# Patient Record
Sex: Female | Born: 1975 | Race: Black or African American | Hispanic: No | Marital: Married | Smoking: Former smoker
Health system: Southern US, Community
[De-identification: ages and names within clinical notes are randomized; demographics above are authoritative.]

## PROBLEM LIST (undated history)

## (undated) ENCOUNTER — Inpatient Hospital Stay (HOSPITAL_COMMUNITY): Payer: Self-pay

## (undated) DIAGNOSIS — R12 Heartburn: Secondary | ICD-10-CM

## (undated) DIAGNOSIS — R87619 Unspecified abnormal cytological findings in specimens from cervix uteri: Secondary | ICD-10-CM

## (undated) DIAGNOSIS — E739 Lactose intolerance, unspecified: Secondary | ICD-10-CM

## (undated) DIAGNOSIS — D219 Benign neoplasm of connective and other soft tissue, unspecified: Secondary | ICD-10-CM

## (undated) DIAGNOSIS — R51 Headache: Secondary | ICD-10-CM

## (undated) DIAGNOSIS — M1990 Unspecified osteoarthritis, unspecified site: Secondary | ICD-10-CM

## (undated) DIAGNOSIS — D649 Anemia, unspecified: Secondary | ICD-10-CM

## (undated) DIAGNOSIS — IMO0002 Reserved for concepts with insufficient information to code with codable children: Secondary | ICD-10-CM

## (undated) DIAGNOSIS — O26899 Other specified pregnancy related conditions, unspecified trimester: Secondary | ICD-10-CM

## (undated) DIAGNOSIS — M109 Gout, unspecified: Secondary | ICD-10-CM

## (undated) DIAGNOSIS — Z8719 Personal history of other diseases of the digestive system: Secondary | ICD-10-CM

## (undated) DIAGNOSIS — N809 Endometriosis, unspecified: Secondary | ICD-10-CM

## (undated) DIAGNOSIS — R519 Headache, unspecified: Secondary | ICD-10-CM

## (undated) DIAGNOSIS — E039 Hypothyroidism, unspecified: Secondary | ICD-10-CM

## (undated) HISTORY — PX: CRYOTHERAPY: SHX1416

## (undated) HISTORY — PX: UPPER GI ENDOSCOPY: SHX6162

## (undated) HISTORY — PX: COLONOSCOPY: SHX174

## (undated) HISTORY — PX: TOTAL HIP ARTHROPLASTY: SHX124

## (undated) HISTORY — PX: OTHER SURGICAL HISTORY: SHX169

## (undated) HISTORY — PX: DIAGNOSTIC LAPAROSCOPY: SUR761

## (undated) HISTORY — PX: WISDOM TOOTH EXTRACTION: SHX21

## (undated) HISTORY — PX: KNEE SURGERY: SHX244

## (undated) HISTORY — DX: Unspecified osteoarthritis, unspecified site: M19.90

---

## 2004-11-30 ENCOUNTER — Emergency Department (HOSPITAL_COMMUNITY): Admission: EM | Admit: 2004-11-30 | Discharge: 2004-11-30 | Payer: Self-pay | Admitting: Emergency Medicine

## 2004-12-11 ENCOUNTER — Emergency Department (HOSPITAL_COMMUNITY): Admission: EM | Admit: 2004-12-11 | Discharge: 2004-12-11 | Payer: Self-pay | Admitting: Emergency Medicine

## 2007-11-09 ENCOUNTER — Emergency Department (HOSPITAL_COMMUNITY): Admission: EM | Admit: 2007-11-09 | Discharge: 2007-11-09 | Payer: Self-pay | Admitting: Emergency Medicine

## 2008-03-26 ENCOUNTER — Emergency Department (HOSPITAL_COMMUNITY): Admission: EM | Admit: 2008-03-26 | Discharge: 2008-03-26 | Payer: Self-pay | Admitting: Emergency Medicine

## 2008-06-01 ENCOUNTER — Emergency Department (HOSPITAL_COMMUNITY): Admission: EM | Admit: 2008-06-01 | Discharge: 2008-06-01 | Payer: Self-pay | Admitting: Emergency Medicine

## 2008-06-11 ENCOUNTER — Emergency Department (HOSPITAL_COMMUNITY): Admission: EM | Admit: 2008-06-11 | Discharge: 2008-06-11 | Payer: Self-pay | Admitting: Emergency Medicine

## 2008-09-17 ENCOUNTER — Emergency Department (HOSPITAL_COMMUNITY): Admission: EM | Admit: 2008-09-17 | Discharge: 2008-09-17 | Payer: Self-pay | Admitting: Emergency Medicine

## 2009-01-17 ENCOUNTER — Emergency Department (HOSPITAL_COMMUNITY): Admission: EM | Admit: 2009-01-17 | Discharge: 2009-01-17 | Payer: Self-pay | Admitting: Emergency Medicine

## 2009-02-01 ENCOUNTER — Emergency Department (HOSPITAL_COMMUNITY): Admission: EM | Admit: 2009-02-01 | Discharge: 2009-02-01 | Payer: Self-pay | Admitting: Family Medicine

## 2009-05-19 ENCOUNTER — Emergency Department (HOSPITAL_BASED_OUTPATIENT_CLINIC_OR_DEPARTMENT_OTHER): Admission: EM | Admit: 2009-05-19 | Discharge: 2009-05-20 | Payer: Self-pay | Admitting: Emergency Medicine

## 2009-05-22 ENCOUNTER — Emergency Department (HOSPITAL_COMMUNITY): Admission: EM | Admit: 2009-05-22 | Discharge: 2009-05-22 | Payer: Self-pay | Admitting: Emergency Medicine

## 2009-09-16 ENCOUNTER — Emergency Department (HOSPITAL_COMMUNITY): Admission: EM | Admit: 2009-09-16 | Discharge: 2009-09-16 | Payer: Self-pay | Admitting: Emergency Medicine

## 2009-09-27 ENCOUNTER — Emergency Department (HOSPITAL_COMMUNITY): Admission: EM | Admit: 2009-09-27 | Discharge: 2009-09-28 | Payer: Self-pay | Admitting: Emergency Medicine

## 2010-01-10 ENCOUNTER — Emergency Department (HOSPITAL_COMMUNITY)
Admission: EM | Admit: 2010-01-10 | Discharge: 2010-01-10 | Payer: Self-pay | Source: Home / Self Care | Admitting: Emergency Medicine

## 2010-02-12 ENCOUNTER — Emergency Department (HOSPITAL_COMMUNITY)
Admission: EM | Admit: 2010-02-12 | Discharge: 2010-02-12 | Payer: Self-pay | Source: Home / Self Care | Admitting: Emergency Medicine

## 2010-04-19 LAB — POCT I-STAT, CHEM 8
Calcium, Ion: 1.05 mmol/L — ABNORMAL LOW (ref 1.12–1.32)
Creatinine, Ser: 1 mg/dL (ref 0.4–1.2)
Glucose, Bld: 97 mg/dL (ref 70–99)
HCT: 47 % — ABNORMAL HIGH (ref 36.0–46.0)
Potassium: 3.4 mEq/L — ABNORMAL LOW (ref 3.5–5.1)
TCO2: 22 mmol/L (ref 0–100)

## 2010-04-23 LAB — URINALYSIS, ROUTINE W REFLEX MICROSCOPIC: Specific Gravity, Urine: 1.031 — ABNORMAL HIGH (ref 1.005–1.030)

## 2010-04-23 LAB — URINE MICROSCOPIC-ADD ON

## 2010-04-24 ENCOUNTER — Emergency Department (HOSPITAL_COMMUNITY)
Admission: EM | Admit: 2010-04-24 | Discharge: 2010-04-24 | Disposition: A | Discharge status: Home / Self Care | Payer: Self-pay | Attending: Emergency Medicine | Admitting: Emergency Medicine

## 2010-04-24 DIAGNOSIS — H9210 Otorrhea, unspecified ear: Secondary | ICD-10-CM | POA: Insufficient documentation

## 2010-04-24 DIAGNOSIS — H60399 Other infective otitis externa, unspecified ear: Secondary | ICD-10-CM | POA: Insufficient documentation

## 2010-04-25 ENCOUNTER — Emergency Department (HOSPITAL_COMMUNITY)
Admission: EM | Admit: 2010-04-25 | Discharge: 2010-04-26 | Disposition: A | Discharge status: Other Acute Inpatient Hospital | Payer: Self-pay | Attending: Emergency Medicine | Admitting: Emergency Medicine

## 2010-04-25 ENCOUNTER — Emergency Department (HOSPITAL_COMMUNITY): Payer: Self-pay

## 2010-04-25 DIAGNOSIS — F329 Major depressive disorder, single episode, unspecified: Secondary | ICD-10-CM | POA: Insufficient documentation

## 2010-04-25 DIAGNOSIS — I498 Other specified cardiac arrhythmias: Secondary | ICD-10-CM | POA: Insufficient documentation

## 2010-04-25 DIAGNOSIS — F3289 Other specified depressive episodes: Secondary | ICD-10-CM | POA: Insufficient documentation

## 2010-04-25 DIAGNOSIS — IMO0002 Reserved for concepts with insufficient information to code with codable children: Secondary | ICD-10-CM | POA: Insufficient documentation

## 2010-04-25 DIAGNOSIS — F1010 Alcohol abuse, uncomplicated: Secondary | ICD-10-CM | POA: Insufficient documentation

## 2010-04-25 DIAGNOSIS — H60399 Other infective otitis externa, unspecified ear: Secondary | ICD-10-CM | POA: Insufficient documentation

## 2010-04-25 LAB — COMPREHENSIVE METABOLIC PANEL
ALT: 13 U/L (ref 0–35)
BUN: 8 mg/dL (ref 6–23)
CO2: 19 mEq/L (ref 19–32)
Calcium: 9.4 mg/dL (ref 8.4–10.5)
Chloride: 109 mEq/L (ref 96–112)
Creatinine, Ser: 0.75 mg/dL (ref 0.4–1.2)
GFR calc non Af Amer: >60 mL/min (ref >60)
Potassium: 4.2 mEq/L (ref 3.5–5.1)
Sodium: 143 mEq/L (ref 135–145)
Total Bilirubin: 0.5 mg/dL (ref 0.3–1.2)

## 2010-04-25 LAB — URINALYSIS, ROUTINE W REFLEX MICROSCOPIC
Glucose, UA: NEGATIVE mg/dL
Ketones, ur: NEGATIVE mg/dL
Nitrite: NEGATIVE
Protein, ur: NEGATIVE mg/dL
Specific Gravity, Urine: 1.006 (ref 1.005–1.030)
Urobilinogen, UA: 0.2 mg/dL (ref 0.0–1.0)

## 2010-04-25 LAB — CBC
HCT: 44.3 % (ref 36.0–46.0)
Hemoglobin: 14.8 g/dL (ref 12.0–15.0)
MCV: 94.9 fL (ref 78.0–100.0)
RBC: 4.67 MIL/uL (ref 3.87–5.11)
RDW: 13 % (ref 11.5–15.5)

## 2010-04-25 LAB — DIFFERENTIAL
Basophils Absolute: 0.1 10*3/uL (ref 0.0–0.1)
Basophils Relative: 1 % (ref 0–1)
Eosinophils Relative: 0 % (ref 0–5)
Lymphs Abs: 2.3 10*3/uL (ref 0.7–4.0)
Neutro Abs: 7.7 10*3/uL (ref 1.7–7.7)
Neutrophils Relative %: 71 % (ref 43–77)

## 2010-04-25 LAB — PREGNANCY, URINE: Preg Test, Ur: NEGATIVE

## 2010-04-25 LAB — POCT CARDIAC MARKERS: Myoglobin, poc: 108 ng/mL (ref 12–200)

## 2010-04-25 LAB — RAPID URINE DRUG SCREEN, HOSP PERFORMED
Benzodiazepines: NOT DETECTED
Cocaine: NOT DETECTED

## 2010-04-25 LAB — ACETAMINOPHEN LEVEL: Acetaminophen (Tylenol), Serum: <10 ug/mL — ABNORMAL LOW (ref 10–30)

## 2010-04-25 LAB — ETHANOL: Alcohol, Ethyl (B): 113 mg/dL — ABNORMAL HIGH (ref 0–10)

## 2010-04-26 ENCOUNTER — Inpatient Hospital Stay (HOSPITAL_COMMUNITY)
Admission: AD | Admit: 2010-04-26 | Discharge: 2010-04-27 | DRG: 882 | Disposition: A | Discharge status: Home / Self Care | Payer: PRIVATE HEALTH INSURANCE | Source: Ambulatory Visit | Attending: Psychiatry | Admitting: Psychiatry

## 2010-04-26 DIAGNOSIS — F4325 Adjustment disorder with mixed disturbance of emotions and conduct: Principal | ICD-10-CM

## 2010-04-26 DIAGNOSIS — M109 Gout, unspecified: Secondary | ICD-10-CM

## 2010-04-26 DIAGNOSIS — F3289 Other specified depressive episodes: Secondary | ICD-10-CM

## 2010-04-26 DIAGNOSIS — T481X4A Poisoning by skeletal muscle relaxants [neuromuscular blocking agents], undetermined, initial encounter: Secondary | ICD-10-CM

## 2010-04-26 DIAGNOSIS — F329 Major depressive disorder, single episode, unspecified: Secondary | ICD-10-CM

## 2010-04-26 DIAGNOSIS — H60399 Other infective otitis externa, unspecified ear: Secondary | ICD-10-CM

## 2010-04-26 DIAGNOSIS — F1020 Alcohol dependence, uncomplicated: Secondary | ICD-10-CM

## 2010-04-26 DIAGNOSIS — T50992A Poisoning by other drugs, medicaments and biological substances, intentional self-harm, initial encounter: Secondary | ICD-10-CM

## 2010-04-26 DIAGNOSIS — F332 Major depressive disorder, recurrent severe without psychotic features: Secondary | ICD-10-CM

## 2010-04-27 DIAGNOSIS — F4320 Adjustment disorder, unspecified: Secondary | ICD-10-CM

## 2010-05-07 LAB — DIFFERENTIAL
Basophils Absolute: 0 10*3/uL (ref 0.0–0.1)
Basophils Relative: 0 % (ref 0–1)
Lymphs Abs: 1.3 10*3/uL (ref 0.7–4.0)
Neutro Abs: 7.4 10*3/uL (ref 1.7–7.7)
Neutrophils Relative %: 76 % (ref 43–77)

## 2010-05-07 LAB — WET PREP, GENITAL
Clue Cells Wet Prep HPF POC: NONE SEEN
WBC, Wet Prep HPF POC: NONE SEEN

## 2010-05-07 LAB — CBC
HCT: 41.2 % (ref 36.0–46.0)
Hemoglobin: 14.2 g/dL (ref 12.0–15.0)
MCHC: 34.4 g/dL (ref 30.0–36.0)
MCV: 96.3 fL (ref 78.0–100.0)
RDW: 13 % (ref 11.5–15.5)

## 2010-05-07 LAB — URINALYSIS, ROUTINE W REFLEX MICROSCOPIC
Bilirubin Urine: NEGATIVE
Nitrite: NEGATIVE
Protein, ur: NEGATIVE mg/dL
Specific Gravity, Urine: 1.01 (ref 1.005–1.030)
Urobilinogen, UA: 0.2 mg/dL (ref 0.0–1.0)

## 2010-05-07 LAB — URINE MICROSCOPIC-ADD ON

## 2010-05-07 LAB — COMPREHENSIVE METABOLIC PANEL
AST: 17 U/L (ref 0–37)
Albumin: 3.5 g/dL (ref 3.5–5.2)
Alkaline Phosphatase: 57 U/L (ref 39–117)
BUN: 5 mg/dL — ABNORMAL LOW (ref 6–23)
CO2: 27 mEq/L (ref 19–32)
Chloride: 103 mEq/L (ref 96–112)
Glucose, Bld: 97 mg/dL (ref 70–99)
Total Bilirubin: 0.5 mg/dL (ref 0.3–1.2)

## 2010-05-15 LAB — URINALYSIS, ROUTINE W REFLEX MICROSCOPIC
Ketones, ur: 15 mg/dL — AB
Nitrite: NEGATIVE
Nitrite: NEGATIVE
Protein, ur: 30 mg/dL — AB
Specific Gravity, Urine: 1.025 (ref 1.005–1.030)
Urobilinogen, UA: 1 mg/dL (ref 0.0–1.0)
Urobilinogen, UA: 1 mg/dL (ref 0.0–1.0)

## 2010-05-15 LAB — WET PREP, GENITAL
Clue Cells Wet Prep HPF POC: NONE SEEN
Trich, Wet Prep: NONE SEEN
WBC, Wet Prep HPF POC: NONE SEEN

## 2010-05-15 LAB — POCT PREGNANCY, URINE
Preg Test, Ur: NEGATIVE
Preg Test, Ur: NEGATIVE

## 2010-05-15 LAB — URINE MICROSCOPIC-ADD ON

## 2010-05-21 LAB — SYNOVIAL CELL COUNT + DIFF, W/ CRYSTALS
Crystals, Fluid: NONE SEEN
WBC, Synovial: 14330 /mm3 — ABNORMAL HIGH (ref 0–200)

## 2010-05-21 LAB — GRAM STAIN

## 2010-05-21 LAB — BODY FLUID CULTURE: Culture: NO GROWTH

## 2010-05-21 LAB — GLUCOSE, SEROUS FLUID: Glucose, Fluid: 95 mg/dL

## 2010-06-10 NOTE — H&P (Signed)
NAME:  Paula Cervantes, Paula Cervantes NO.:  [id number]  MEDICAL RECORD NO.:  [id number]           PATIENT TYPE:  I  LOCATION:  0304                          FACILITY:  [facility]  PHYSICIAN:  Anselm Jungling, MD  DATE OF BIRTH:  December 30, 1975  DATE OF ADMISSION:  04/26/2010 DATE OF DISCHARGE:                      PSYCHIATRIC ADMISSION ASSESSMENT   This is a voluntary admission to the services of Dr. Electa Sniff.  This is a 35 year old divorced African American female.  She was brought to the ED by EMS.  She had overdosed.  She was noted to be unresponsive when EMS got there. 9-1-1 had received a call reporting that the patient had overdosed.  She was unable to answer questions.  She did smell of alcohol at that time they found her.  There was an open bottle of Flexeril 10 mg near her. Her alcohol level apparently was 118 and today she reports that she actually overdosed on 2 Flexeril.  She reported that she had found sexually explicit photos of her fiance on her phone; this prompted her to drink, she has not been drinking in over a year. She stated that she could not remember exactly how she got to the ED but apparently she called her fiancee's mother who called EMS.  She did report that she has attempted suicide in the past, and she denied suicidal, homicidal or auditory or visual hallucinations while in the emergency room.  PAST PSYCHIATRIC HISTORY:  At age 53 or 41 she overdosed on nicotine pills; this was due to being unhappy with her group home foster family situation.  She denies any other recent hospitalizations.  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  She has been married and divorced once.  She has no children.  She recently lost her employment.  She was in a manufacturing position.  FAMILY HISTORY:  She denies alcohol and drug history.  She states that on March 08, 2009 she woke up in her own bed after an alcoholic blackouts and she  has not drank any alcohol until she found the sexually explicit pictures of her boyfriend on a cell phone.  PRIMARY CARE PROVIDER:  She does not have one.  OUTPATIENT PSYCHIATRIC CARE:  She has no outpatient psychiatric care.  MEDICAL PROBLEMS:  She is known to have gout.  She currently has otitis externa and she reports a history for being told she has degenerative disk disease.  MEDICATIONS:  Her last gout flare was in January.  She is supposed to be taking allopurinol.  DRUG ALLERGIES:  Drug allergies are to aspirin, penicillin and sulfa.  POSITIVE PHYSICAL FINDINGS:  GENERAL APPEARANCE:  She is medically cleared in the ED at [facility]. VITAL SIGNS:  She was afebrile 98.4 to 98, pulse was 92 to 118, respirations were 12 to 20.  Her urinalysis showed she had no UTI.  She her pregnancy test was negative.  Her CBC was within normal limits.  Her UDS was negative.  She did have an alcohol level of 113.  And, today she is fully recovered. She does not have any alcohol hangover and she is requesting discharge. She  was seen in conjunction with Dr. Electa Sniff.  She was noted to be well- developed, well-nourished [place] American female who is alert and oriented times 4.  She was pleasant and cooperative, soft spoken, sad. She denied being suicidal.  She does admit to alcohol problems.  Her last blackout being last year.  She denies depression and/or any need for treatment. There  was no evidence for psychosis or abnormal thinking.  DIAGNOSIS:  AXIS I:  Rule out depressive disorder NOS, alcohol abuse rule out dependence. AXIS II:  Trauma as a child, foster homes and group homes. AXIS III:  History for gout and otitis externa. AXIS IV:  Primary supports, relationship issues and occupational issues. AXIS V:  20.  PLAN:  She was admitted for safety and stabilization.  She has resolved her elevated alcohol level.  She is requesting discharge.  We will have the social worker call her  boyfriend's mother regarding early release and she can be admitted to the [facility] hall.     Mickie Leonarda Salon, P.A.-C.   ______________________________ Anselm Jungling, MD    MD/MEDQ  D:  04/27/2010  T:  04/27/2010  Job:  [id number]  Electronically Signed by Jaci Lazier ADAMS P.A.-C. on 06/08/2010 12:40:50 PM Electronically Signed by Geralyn Flash MD on 06/10/2010 12:10:58 PM

## 2010-08-26 ENCOUNTER — Emergency Department (HOSPITAL_COMMUNITY)
Admission: EM | Admit: 2010-08-26 | Discharge: 2010-08-26 | Disposition: A | Discharge status: Home / Self Care | Payer: Self-pay | Attending: Emergency Medicine | Admitting: Emergency Medicine

## 2010-08-26 DIAGNOSIS — H9209 Otalgia, unspecified ear: Secondary | ICD-10-CM | POA: Insufficient documentation

## 2010-08-26 DIAGNOSIS — L298 Other pruritus: Secondary | ICD-10-CM | POA: Insufficient documentation

## 2010-08-26 DIAGNOSIS — H9210 Otorrhea, unspecified ear: Secondary | ICD-10-CM | POA: Insufficient documentation

## 2010-08-26 DIAGNOSIS — L408 Other psoriasis: Secondary | ICD-10-CM | POA: Insufficient documentation

## 2010-08-26 DIAGNOSIS — L2989 Other pruritus: Secondary | ICD-10-CM | POA: Insufficient documentation

## 2010-11-04 LAB — URINALYSIS, ROUTINE W REFLEX MICROSCOPIC
Glucose, UA: NEGATIVE
Hgb urine dipstick: NEGATIVE
Ketones, ur: NEGATIVE
Protein, ur: NEGATIVE
pH: 7.5

## 2010-11-04 LAB — GC/CHLAMYDIA PROBE AMP, GENITAL: Chlamydia, DNA Probe: NEGATIVE

## 2010-11-04 LAB — POCT PREGNANCY, URINE: Preg Test, Ur: NEGATIVE

## 2010-11-04 LAB — WET PREP, GENITAL
Trich, Wet Prep: NONE SEEN
Yeast Wet Prep HPF POC: NONE SEEN

## 2011-02-27 ENCOUNTER — Emergency Department (HOSPITAL_COMMUNITY)
Admission: EM | Admit: 2011-02-27 | Discharge: 2011-02-27 | Disposition: A | Discharge status: Home / Self Care | Payer: Self-pay | Attending: Emergency Medicine | Admitting: Emergency Medicine

## 2011-02-27 ENCOUNTER — Encounter (HOSPITAL_COMMUNITY): Payer: Self-pay | Admitting: *Deleted

## 2011-02-27 DIAGNOSIS — J45909 Unspecified asthma, uncomplicated: Secondary | ICD-10-CM | POA: Insufficient documentation

## 2011-02-27 DIAGNOSIS — R350 Frequency of micturition: Secondary | ICD-10-CM | POA: Insufficient documentation

## 2011-02-27 DIAGNOSIS — R3915 Urgency of urination: Secondary | ICD-10-CM | POA: Insufficient documentation

## 2011-02-27 DIAGNOSIS — N390 Urinary tract infection, site not specified: Secondary | ICD-10-CM | POA: Insufficient documentation

## 2011-02-27 DIAGNOSIS — R300 Dysuria: Secondary | ICD-10-CM | POA: Insufficient documentation

## 2011-02-27 LAB — URINE MICROSCOPIC-ADD ON

## 2011-02-27 LAB — URINALYSIS, ROUTINE W REFLEX MICROSCOPIC
Bilirubin Urine: NEGATIVE
Glucose, UA: NEGATIVE mg/dL
Hgb urine dipstick: NEGATIVE
Ketones, ur: NEGATIVE mg/dL
Protein, ur: NEGATIVE mg/dL
pH: 6 (ref 5.0–8.0)

## 2011-02-27 MED ORDER — NITROFURANTOIN MONOHYD MACRO 100 MG PO CAPS
100.0000 mg | ORAL_CAPSULE | Freq: Once | ORAL | Status: AC
  Administered 2011-02-27: 100 mg via ORAL
  Filled 2011-02-27: qty 1

## 2011-02-27 MED ORDER — PHENAZOPYRIDINE HCL 200 MG PO TABS: 200.0000 mg | ORAL_TABLET | Freq: Two times a day (BID) | ORAL | Status: AC

## 2011-02-27 MED ORDER — PHENAZOPYRIDINE HCL 100 MG PO TABS
200.0000 mg | ORAL_TABLET | Freq: Once | ORAL | Status: AC
  Administered 2011-02-27: 200 mg via ORAL
  Filled 2011-02-27: qty 2

## 2011-02-27 MED ORDER — NITROFURANTOIN MONOHYD MACRO 100 MG PO CAPS: 100.0000 mg | ORAL_CAPSULE | Freq: Once | ORAL | Status: AC

## 2011-02-27 NOTE — ED Notes (Signed)
C.o burning and urgency since yesterday   Patient states hx of UTI's

## 2011-02-27 NOTE — ED Notes (Signed)
Pt reports urinary frequency, burning with urination, dark urine.  Abdominal cramping.

## 2011-02-27 NOTE — ED Provider Notes (Signed)
History     CSN: [id number]  Arrival date & time 02/27/11  0023   First MD Initiated Contact with Patient 02/27/11 0046      Chief Complaint  Patient presents with  . Urinary Tract Infection    (Consider location/radiation/quality/duration/timing/severity/associated sxs/prior treatment) HPI Comments: Patient states she started this morning with dysuria and frequency.  Denies nausea, vomiting, fever or chills, vaginal discharge.  No concern for pregnancy or STD  Patient is a 36 y.o. female presenting with urinary tract infection. The history is provided by the patient.  Urinary Tract Infection This is a new problem. The current episode started today. The problem occurs 2 to 4 times per day. Associated symptoms include urinary symptoms. Pertinent negatives include no chills, fever, nausea, vomiting or weakness.    Past Medical History  Diagnosis Date  . Asthma     Past Surgical History  Procedure Date  . Ovarian fibroids   . Knee surgery     History reviewed. No pertinent family history.  History  Substance Use Topics  . Smoking status: Never Smoker   . Smokeless tobacco: Not on file  . Alcohol Use: No    OB History    Grav Para Term Preterm Abortions TAB SAB Ect Mult Living                  Review of Systems  Constitutional: Negative for fever and chills.  Gastrointestinal: Negative for nausea, vomiting, diarrhea and constipation.  Genitourinary: Positive for dysuria, urgency and frequency. Negative for vaginal discharge and pelvic pain.  Neurological: Negative for dizziness and weakness.    Allergies  Aspirin; Penicillins; and Sulfa antibiotics  Home Medications   Current Outpatient Rx  Name Route Sig Dispense Refill  . NITROFURANTOIN MONOHYD MACRO 100 MG PO CAPS Oral Take 1 capsule (100 mg total) by mouth once. 14 capsule 0  . PHENAZOPYRIDINE HCL 200 MG PO TABS Oral Take 1 tablet (200 mg total) by mouth 2 (two) times daily. 10 tablet 0    BP 124/66   Pulse 87  Temp(Src) 98.5 F (36.9 C) (Oral)  Resp 18  SpO2 98%  Physical Exam  Constitutional: She is oriented to person, place, and time. She appears well-developed and well-nourished.  HENT:  Head: Normocephalic.  Eyes: Pupils are equal, round, and reactive to light.  Neck: Normal range of motion.  Cardiovascular: Normal rate.   Pulmonary/Chest: Effort normal.  Musculoskeletal: Normal range of motion.  Neurological: She is alert and oriented to person, place, and time.  Skin: Skin is warm and dry.    ED Course  Procedures (including critical care time)  Labs Reviewed  URINALYSIS, ROUTINE W REFLEX MICROSCOPIC - Abnormal; Notable for the following:    APPearance CLOUDY (*)    Leukocytes, UA SMALL (*)    All other components within normal limits  URINE MICROSCOPIC-ADD ON - Abnormal; Notable for the following:    Squamous Epithelial / LPF FEW (*)    Bacteria, UA FEW (*)    All other components within normal limits  POCT PREGNANCY, URINE  POCT PREGNANCY, URINE   No results found.   1. Urinary tract infection    Dysuria we'll obtain a urine for specimen   MDM  Dysuria        Arman Filter, NP 02/27/11 0145

## 2011-03-05 NOTE — ED Provider Notes (Signed)
Evaluation and management procedures were performed by the PA/NP under my supervision/collaboration.   Felisa Bonier, MD 03/05/11 2002

## 2011-06-04 ENCOUNTER — Encounter (HOSPITAL_COMMUNITY): Payer: Self-pay | Admitting: *Deleted

## 2011-06-04 ENCOUNTER — Emergency Department (INDEPENDENT_AMBULATORY_CARE_PROVIDER_SITE_OTHER)
Admission: EM | Admit: 2011-06-04 | Discharge: 2011-06-04 | Disposition: A | Discharge status: Home / Self Care | Payer: BC Managed Care – PPO | Source: Home / Self Care | Attending: Family Medicine | Admitting: Family Medicine

## 2011-06-04 DIAGNOSIS — R519 Headache, unspecified: Secondary | ICD-10-CM

## 2011-06-04 DIAGNOSIS — R112 Nausea with vomiting, unspecified: Secondary | ICD-10-CM

## 2011-06-04 DIAGNOSIS — R51 Headache: Secondary | ICD-10-CM

## 2011-06-04 DIAGNOSIS — R1111 Vomiting without nausea: Secondary | ICD-10-CM

## 2011-06-04 LAB — POCT PREGNANCY, URINE: Preg Test, Ur: NEGATIVE

## 2011-06-04 LAB — POCT URINALYSIS DIP (DEVICE)
Bilirubin Urine: NEGATIVE
Ketones, ur: NEGATIVE mg/dL
Leukocytes, UA: NEGATIVE
Protein, ur: 30 mg/dL — AB

## 2011-06-04 MED ORDER — TRAMADOL HCL 50 MG PO TABS: 50.0000 mg | ORAL_TABLET | Freq: Four times a day (QID) | ORAL | Status: AC | PRN

## 2011-06-04 MED ORDER — ONDANSETRON 4 MG PO TBDP
8.0000 mg | ORAL_TABLET | Freq: Once | ORAL | Status: AC
  Administered 2011-06-04: 8 mg via ORAL

## 2011-06-04 MED ORDER — ONDANSETRON 4 MG PO TBDP
ORAL_TABLET | ORAL | Status: AC
  Filled 2011-06-04: qty 2

## 2011-06-04 MED ORDER — ONDANSETRON HCL 4 MG PO TABS: 4.0000 mg | ORAL_TABLET | Freq: Four times a day (QID) | ORAL | Status: AC

## 2011-06-04 NOTE — ED Provider Notes (Signed)
History     CSN: [id number]  Arrival date & time 06/04/11  1624   First MD Initiated Contact with Patient 06/04/11 1628      Chief Complaint  Patient presents with  . Abdominal Pain    (Consider location/radiation/quality/duration/timing/severity/associated sxs/prior treatment) Patient is a 36 y.o. female presenting with vomiting. The history is provided by the patient.  Emesis  This is a new problem. The current episode started yesterday (yest with n/v then HA all day, continues today, no fever, no diarrhea.). The problem occurs 2 to 4 times per day. The problem has not changed since onset.The emesis has an appearance of stomach contents. There has been no fever. Associated symptoms include headaches. Pertinent negatives include no chills, no cough, no diarrhea, no fever and no URI.    Past Medical History  Diagnosis Date  . Asthma     Past Surgical History  Procedure Date  . Ovarian fibroids   . Knee surgery     History reviewed. No pertinent family history.  History  Substance Use Topics  . Smoking status: Never Smoker   . Smokeless tobacco: Not on file  . Alcohol Use: No    OB History    Grav Para Term Preterm Abortions TAB SAB Ect Mult Living                  Review of Systems  Constitutional: Negative for fever and chills.  HENT: Positive for congestion and rhinorrhea.   Respiratory: Negative for cough.   Gastrointestinal: Positive for nausea and vomiting. Negative for diarrhea and blood in stool.  Genitourinary: Negative.   Skin: Negative.   Neurological: Positive for headaches.  Psychiatric/Behavioral: Negative.     Allergies  Aspirin; Penicillins; and Sulfa antibiotics  Home Medications   Current Outpatient Rx  Name Route Sig Dispense Refill  . ONDANSETRON HCL 4 MG PO TABS Oral Take 1 tablet (4 mg total) by mouth every 6 (six) hours. 8 tablet 0  . TRAMADOL HCL 50 MG PO TABS Oral Take 1 tablet (50 mg total) by mouth every 6 (six) hours as needed  for pain. 15 tablet 0    BP 101/74  Pulse 89  Temp(Src) 98.6 F (37 C) (Oral)  Resp 17  SpO2 98%  LMP 05/22/2011  Physical Exam  Nursing note and vitals reviewed. Constitutional: She is oriented to person, place, and time. She appears well-developed and well-nourished.  HENT:  Head: Normocephalic.  Right Ear: External ear normal.  Left Ear: External ear normal.  Nose: Nose normal.  Mouth/Throat: Oropharynx is clear and moist.  Eyes: Conjunctivae and EOM are normal. Pupils are equal, round, and reactive to light.  Neck: Normal range of motion. Neck supple.  Cardiovascular: Normal rate, regular rhythm, normal heart sounds and intact distal pulses.   Abdominal: Soft. Bowel sounds are normal. She exhibits no distension and no mass. There is no rebound and no guarding.  Lymphadenopathy:    She has no cervical adenopathy.  Neurological: She is alert and oriented to person, place, and time. No cranial nerve deficit.  Skin: Skin is warm and dry.  Psychiatric: She has a normal mood and affect. Her behavior is normal.    ED Course  Procedures (including critical care time)  Labs Reviewed  POCT URINALYSIS DIP (DEVICE) - Abnormal; Notable for the following:    pH 8.5 (*)    Protein, ur 30 (*)    All other components within normal limits  POCT PREGNANCY, URINE  LAB  REPORT - SCANNED   No results found.   1. Vomiting alone   2. Headache above the eye region       MDM          Linna Hoff, MD 06/06/11 0900

## 2011-06-04 NOTE — Discharge Instructions (Signed)
Use medicine as needed, drink plenty of fluids, advance as tolerated, see your doctor or return if further problems.

## 2011-06-04 NOTE — ED Notes (Signed)
Pt  Has  Symptoms  Of  Vomiting  Headache   And  abd  Pain     Which  Started  yest    She  Reports  The  Pain in head  Is  Above  The  l  Eye       She  denys      Any   Diarrhea   Nor  Any        Urinary  Or    Vaginal bleeding      She  Is  Sitting  Upright on exam table   She   Reports    The light  Hurts  Her  Eyes  Somewhat

## 2011-08-15 ENCOUNTER — Emergency Department (HOSPITAL_COMMUNITY)
Admission: EM | Admit: 2011-08-15 | Discharge: 2011-08-16 | Disposition: A | Discharge status: Home / Self Care | Payer: Self-pay | Attending: Emergency Medicine | Admitting: Emergency Medicine

## 2011-08-15 ENCOUNTER — Encounter (HOSPITAL_COMMUNITY): Payer: Self-pay | Admitting: Emergency Medicine

## 2011-08-15 DIAGNOSIS — R51 Headache: Secondary | ICD-10-CM | POA: Insufficient documentation

## 2011-08-15 DIAGNOSIS — J45909 Unspecified asthma, uncomplicated: Secondary | ICD-10-CM | POA: Insufficient documentation

## 2011-08-15 DIAGNOSIS — R112 Nausea with vomiting, unspecified: Secondary | ICD-10-CM | POA: Insufficient documentation

## 2011-08-15 LAB — URINALYSIS, ROUTINE W REFLEX MICROSCOPIC
Bilirubin Urine: NEGATIVE
Nitrite: NEGATIVE
Protein, ur: 30 mg/dL — AB
Specific Gravity, Urine: 1.027 (ref 1.005–1.030)
Urobilinogen, UA: 0.2 mg/dL (ref 0.0–1.0)

## 2011-08-15 LAB — URINE MICROSCOPIC-ADD ON

## 2011-08-15 MED ORDER — PANTOPRAZOLE SODIUM 40 MG IV SOLR
40.0000 mg | Freq: Once | INTRAVENOUS | Status: AC
  Administered 2011-08-15: 40 mg via INTRAVENOUS
  Filled 2011-08-15: qty 40

## 2011-08-15 MED ORDER — MORPHINE SULFATE 4 MG/ML IJ SOLN
4.0000 mg | Freq: Once | INTRAMUSCULAR | Status: AC
  Administered 2011-08-15: 4 mg via INTRAVENOUS
  Filled 2011-08-15: qty 1

## 2011-08-15 MED ORDER — SODIUM CHLORIDE 0.9 % IV BOLUS (SEPSIS)
1000.0000 mL | Freq: Once | INTRAVENOUS | Status: AC
  Administered 2011-08-15: 1000 mL via INTRAVENOUS

## 2011-08-15 MED ORDER — DIPHENHYDRAMINE HCL 50 MG/ML IJ SOLN
25.0000 mg | Freq: Once | INTRAMUSCULAR | Status: AC
  Administered 2011-08-15: 25 mg via INTRAVENOUS
  Filled 2011-08-15: qty 1

## 2011-08-15 MED ORDER — PROMETHAZINE HCL 25 MG PO TABS: 25.0000 mg | ORAL_TABLET | Freq: Three times a day (TID) | ORAL | Status: DC | PRN

## 2011-08-15 MED ORDER — PROMETHAZINE HCL 25 MG/ML IJ SOLN
25.0000 mg | Freq: Four times a day (QID) | INTRAMUSCULAR | Status: DC | PRN
  Administered 2011-08-15: 25 mg via INTRAVENOUS
  Filled 2011-08-15: qty 1

## 2011-08-15 NOTE — ED Notes (Signed)
Patient with headache and vomiting since this afternoon.  Patient states that she did see some blood in her vomitus.  Patient is photophobic, nauseated and sensitive to noise.

## 2011-08-15 NOTE — ED Notes (Signed)
Patient updated regarding status and delay.  Apology for delay.  Explained priority.

## 2011-08-15 NOTE — ED Provider Notes (Signed)
History     CSN: [id number]  Arrival date & time 08/15/11  1652   First MD Initiated Contact with Patient 08/15/11 2134      Chief Complaint  Patient presents with  . Headache  . Emesis    (Consider location/radiation/quality/duration/timing/severity/associated sxs/prior treatment) HPI Comments: Patient reports she was at work when she had a sudden onset of nausea and vomiting.  States she initially felt better after vomiting, then had it happen again several more times.  States one of the episodes she noticed dark red streaks in her emesis.  States with all the vomiting she developed a headache over her right forehead.  Denies any focal neurological deficits.  Has taken tylenol and ibuprofen without relief.  Denies abdominal pain, diarrhea.  Denies any sick contacts, recent change in foods, recent travel.  Pt has recently been started on Clomid by her Obgyn.  No urinary symptoms, no abnormal vaginal bleeding or discharge.  LMP last Friday was on time and normal.    The history is provided by the patient.    Past Medical History  Diagnosis Date  . Asthma     Past Surgical History  Procedure Date  . Ovarian fibroids   . Knee surgery     No family history on file.  History  Substance Use Topics  . Smoking status: Never Smoker   . Smokeless tobacco: Not on file  . Alcohol Use: No    OB History    Grav Para Term Preterm Abortions TAB SAB Ect Mult Living                  Review of Systems  Constitutional: Negative for fever and chills.  Gastrointestinal: Positive for nausea and vomiting. Negative for abdominal pain and diarrhea.  Genitourinary: Negative for dysuria, urgency, frequency, vaginal bleeding, vaginal discharge and menstrual problem.  Neurological: Positive for headaches. Negative for weakness and numbness.    Allergies  Aspirin; Coconut flavor; Penicillins; and Sulfa antibiotics  Home Medications   Current Outpatient Rx  Name Route Sig Dispense Refill   . CLOMIPHENE CITRATE 50 MG PO TABS Oral Take 50 mg by mouth daily.    Marland Kitchen FOLIC ACID 1 MG PO TABS Oral Take 1 mg by mouth daily.      BP 128/82  Pulse 90  Temp 98.9 F (37.2 C) (Oral)  Resp 20  SpO2 100%  LMP 08/08/2011  Physical Exam  Nursing note and vitals reviewed. Constitutional: She is oriented to person, place, and time. She appears well-developed and well-nourished. No distress.  HENT:  Head: Normocephalic and atraumatic.  Neck: Neck supple.  Cardiovascular: Normal rate, regular rhythm and normal heart sounds.   Pulmonary/Chest: Breath sounds normal. No respiratory distress. She has no wheezes. She has no rales. She exhibits no tenderness.  Abdominal: Soft. Bowel sounds are normal. She exhibits no distension and no mass. There is no tenderness. There is no rebound and no guarding.  Musculoskeletal: Normal range of motion. She exhibits no tenderness.  Neurological: She is alert and oriented to person, place, and time. She has normal strength. No cranial nerve deficit or sensory deficit. She exhibits normal muscle tone. GCS eye subscore is 4. GCS verbal subscore is 5. GCS motor subscore is 6.       CN II-XII intact, EOMs intact, no pronator drift, grip strengths equal bilaterally; finger to nose, heel to shin, rapid alternating movements are normal; strength 5/5 in all extremities, sensation is intact.   Skin: She is  not diaphoretic.    ED Course  Procedures (including critical care time)  Labs Reviewed  URINALYSIS, ROUTINE W REFLEX MICROSCOPIC - Abnormal; Notable for the following:    APPearance CLOUDY (*)     Protein, ur 30 (*)     Leukocytes, UA TRACE (*)     All other components within normal limits  URINE MICROSCOPIC-ADD ON - Abnormal; Notable for the following:    Squamous Epithelial / LPF FEW (*)     Bacteria, UA MANY (*)     All other components within normal limits  PREGNANCY, URINE  URINE CULTURE   No results found.  11:25 PM Patient reports complete  relief of headache, no further nausea or vomiting.  Discussed urine results, pt to be d/c home with phernegan.  Urine sent for culture.    1. Headache   2. Nausea and vomiting       MDM  Patient with several episodes of N/V followed by headache.  No vomiting during ED visit.  Headache completely relieved with phenergan and morphine (1 dose of each).  No neurological deficits.  No red flags for headache.  Unsure why patient is having N/V - this may be a side effect of the clomid, have asked patient to follow up with obgyn.  Urine with many bacterial, but only 0-2 WBC - pt is not symptomatic.  I have sent this for culture.  Discussed return precautions. Patient verbalizes understanding and agrees with plan.          Dillard Cannon [place], [place] 08/16/11 [phone]

## 2011-08-15 NOTE — ED Notes (Signed)
Explained to patient where she was on the waiting list.  Apologized for the wait.

## 2011-08-15 NOTE — ED Notes (Signed)
C/o headache, nausea, and vomiting (x4) since 2:30pm.  States she vomited dark colored blood x 1.

## 2011-08-15 NOTE — ED Notes (Signed)
Nurse first stated that pt left due to wait.  I Psychologist, occupational) called pt and asked her to come back to be seen.  Pt taken straight to fast track.  Elliot Gurney, RN and [place], [place] notified of pt.

## 2011-08-16 NOTE — ED Provider Notes (Signed)
Medical screening examination/treatment/procedure(s) were performed by non-physician practitioner and as supervising physician I was immediately available for consultation/collaboration.   Margaree Sandhu E Domenik Trice, MD 08/16/11 1616 

## 2011-08-16 NOTE — ED Notes (Signed)
Patient states she is feeling much better.  Offered a wheelchair to go out but she requested to walk

## 2011-08-16 NOTE — ED Notes (Signed)
20 gauge cath IV removed from right AC.  Cath intact, site without redness or swelling.

## 2011-08-17 LAB — URINE CULTURE: Colony Count: 85000

## 2011-11-20 ENCOUNTER — Emergency Department (HOSPITAL_COMMUNITY)
Admission: EM | Admit: 2011-11-20 | Discharge: 2011-11-20 | Disposition: A | Discharge status: Home / Self Care | Payer: BC Managed Care – PPO | Attending: Emergency Medicine | Admitting: Emergency Medicine

## 2011-11-20 ENCOUNTER — Encounter (HOSPITAL_COMMUNITY): Payer: Self-pay | Admitting: Nurse Practitioner

## 2011-11-20 DIAGNOSIS — J029 Acute pharyngitis, unspecified: Secondary | ICD-10-CM | POA: Insufficient documentation

## 2011-11-20 MED ORDER — AZITHROMYCIN 250 MG PO TABS: ORAL_TABLET | ORAL | Status: DC

## 2011-11-20 NOTE — ED Provider Notes (Signed)
History   This chart was scribed for Richardean Canal, MD by Charolett Bumpers . The patient was seen in room TR09C/TR09C. Patient's care was started at 1846.   CSN: [id number] Arrival date & time 11/20/11  1733  First MD Initiated Contact with Patient 11/20/11 1846      Chief Complaint  Patient presents with  . Sore Throat    The history is provided by the patient. No language interpreter was used.   Paula Cervantes is a 36 y.o. female who presents to the Emergency Department complaining of constant, moderate sore throat since yesterday. She reports recent contact with someone with strep throat. She reports associated congestion, sneezing, rhinorrhea. She also reports associated low grade temperature at home. Congestion, rhinnorhea, sneezing. She denies any cough. She states she has had strep throat in the past. She denies any pertinent medical hx. She denies alcohol or tobacco use.   Past Medical History  Diagnosis Date  . Asthma     Past Surgical History  Procedure Date  . Ovarian fibroids   . Knee surgery     History reviewed. No pertinent family history.  History  Substance Use Topics  . Smoking status: Never Smoker   . Smokeless tobacco: Not on file  . Alcohol Use: No    OB History    Grav Para Term Preterm Abortions TAB SAB Ect Mult Living                  Review of Systems  Constitutional: Positive for fever. Negative for chills.  HENT: Positive for congestion, sore throat and rhinorrhea.   Respiratory: Negative for cough and shortness of breath.   Gastrointestinal: Negative for nausea and vomiting.  Neurological: Negative for weakness.  All other systems reviewed and are negative.    Allergies  Aspirin; Coconut flavor; Penicillins; and Sulfa antibiotics  Home Medications   Current Outpatient Rx  Name Route Sig Dispense Refill  . FOLIC ACID 1 MG PO TABS Oral Take 1 mg by mouth daily.    Marland Kitchen ONDANSETRON HCL 4 MG PO TABS Oral Take 4 mg by mouth every  8 (eight) hours as needed. For nausea    . AZITHROMYCIN 250 MG PO TABS  2 po day one, then 1 daily x 4 days 5 tablet 0    BP 127/70  Pulse 97  Temp 99.1 F (37.3 C) (Oral)  Resp 20  SpO2 99%  Physical Exam  Nursing note and vitals reviewed. Constitutional: She is oriented to person, place, and time. She appears well-developed and well-nourished. No distress.       NAD  HENT:  Head: Normocephalic and atraumatic.  Mouth/Throat: Posterior oropharyngeal erythema present. No oropharyngeal exudate.       Posterior oropharyngeal erythema noted.   Eyes: EOM are normal.  Neck: Neck supple. No tracheal deviation present.       Tender anterior cervical nodes.   Cardiovascular: Normal rate.   Pulmonary/Chest: Effort normal. No respiratory distress.  Musculoskeletal: Normal range of motion.  Lymphadenopathy:    She has cervical adenopathy.  Neurological: She is alert and oriented to person, place, and time.  Skin: Skin is warm and dry.  Psychiatric: She has a normal mood and affect. Her behavior is normal.    ED Course  Procedures (including critical care time)  DIAGNOSTIC STUDIES: Oxygen Saturation is 99% on room air, normal by my interpretation.    COORDINATION OF CARE:  18:50-Discussed planned course of treatment with the patient d/c  home with Azithromycin, who is agreeable at this time.    Labs Reviewed - No data to display No results found.   1. Pharyngitis       MDM  Paula Cervantes is a 36 y.o. female here with sore throat. She has 3/4 Centor criteria but the rhinorrhea is atypical. She is requesting abx as she had strep throat before. I feel that given her symptoms and her centor criteria, she can be treated empirically without rapid strep. She is given zpack (allergic to pcn). Return precautions given.    This document was completed by the scribe at my direction and I have reviewed its accuracy. I have personally examined the patient and agrees with the above  document.   Chaney Malling, MD     Richardean Canal, MD 11/20/11 Ebony Cargo

## 2011-11-20 NOTE — Discharge Instructions (Signed)
Take zpack as prescribed. Continue with hydration and cepacol and salt water gargles.   Follow up with your doctor.   Return to ER if you have fever, severe pain, neck swelling.

## 2011-11-20 NOTE — ED Notes (Signed)
Pt c/o sore throat, chills since yesterday

## 2012-02-08 ENCOUNTER — Encounter (HOSPITAL_COMMUNITY): Payer: Self-pay | Admitting: Emergency Medicine

## 2012-02-08 DIAGNOSIS — Z3201 Encounter for pregnancy test, result positive: Secondary | ICD-10-CM | POA: Insufficient documentation

## 2012-02-08 DIAGNOSIS — Z8742 Personal history of other diseases of the female genital tract: Secondary | ICD-10-CM | POA: Insufficient documentation

## 2012-02-08 DIAGNOSIS — J45909 Unspecified asthma, uncomplicated: Secondary | ICD-10-CM | POA: Insufficient documentation

## 2012-02-08 NOTE — ED Notes (Signed)
Pt requesting pregnancy test.  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  Pt reports breast fullness, nausea, and missing her period last month.

## 2012-02-09 ENCOUNTER — Encounter (HOSPITAL_COMMUNITY): Payer: Self-pay | Admitting: Emergency Medicine

## 2012-02-09 ENCOUNTER — Emergency Department (HOSPITAL_COMMUNITY)
Admission: EM | Admit: 2012-02-09 | Discharge: 2012-02-09 | Disposition: A | Discharge status: Home / Self Care | Payer: Self-pay | Attending: Emergency Medicine | Admitting: Emergency Medicine

## 2012-02-09 DIAGNOSIS — Z3490 Encounter for supervision of normal pregnancy, unspecified, unspecified trimester: Secondary | ICD-10-CM

## 2012-02-09 MED ORDER — PRENATAL COMPLETE 14-0.4 MG PO TABS: 1.0000 | ORAL_TABLET | ORAL | Status: DC

## 2012-02-09 NOTE — ED Provider Notes (Signed)
Medical screening examination/treatment/procedure(s) were performed by non-physician practitioner and as supervising physician I was immediately available for consultation/collaboration.   Louan Base, MD 02/09/12 0638 

## 2012-02-09 NOTE — ED Provider Notes (Addendum)
History     CSN: [id number]  Arrival date & time 02/08/12  2349   None     Chief Complaint  Patient presents with  . Possible Pregnancy    (Consider location/radiation/quality/duration/timing/severity/associated sxs/prior treatment) HPI Comments: Has home pregnancy test X 3 positive   Can't believe it as she was told as a teen that she could not conceive want s confirmation   The history is provided by the patient.    Past Medical History  Diagnosis Date  . Asthma     Past Surgical History  Procedure Date  . Ovarian fibroids   . Knee surgery     No family history on file.  History  Substance Use Topics  . Smoking status: Never Smoker   . Smokeless tobacco: Not on file  . Alcohol Use: No    OB History    Grav Para Term Preterm Abortions TAB SAB Ect Mult Living                  Review of Systems  Gastrointestinal: Negative for abdominal pain.  Genitourinary: Negative for dysuria, frequency, vaginal bleeding and vaginal discharge.  Skin: Negative for rash.  All other systems reviewed and are negative.    Allergies  Aspirin; Coconut flavor; Penicillins; and Sulfa antibiotics  Home Medications   Current Outpatient Rx  Name  Route  Sig  Dispense  Refill  . AZITHROMYCIN 250 MG PO TABS      2 po day one, then 1 daily x 4 days   5 tablet   0   . FOLIC ACID 1 MG PO TABS   Oral   Take 1 mg by mouth daily.         Marland Kitchen ONDANSETRON HCL 4 MG PO TABS   Oral   Take 4 mg by mouth every 8 (eight) hours as needed. For nausea         . PRENATAL COMPLETE 14-0.4 MG PO TABS   Oral   Take 1 tablet by mouth 1 day or 1 dose.   60 each   2     BP 134/73  Temp 98.7 F (37.1 C) (Oral)  Resp 20  SpO2 100%  LMP 12/13/2011  Physical Exam  Nursing note and vitals reviewed. Constitutional: She appears well-developed and well-nourished.  HENT:  Head: Normocephalic.  Eyes: Pupils are equal, round, and reactive to light.  Neck: Normal range of motion.    Cardiovascular: Normal rate.   Pulmonary/Chest: Effort normal.  Abdominal: Soft.  Neurological: She is alert.  Skin: Skin is warm.    ED Course  Procedures (including critical care time)  Labs Reviewed  POCT PREGNANCY, URINE - Abnormal; Notable for the following:    Preg Test, Ur POSITIVE (*)     All other components within normal limits   No results found.   1. Pregnancy       MDM          Arman Filter, NP 02/09/12 0457  Arman Filter, NP 02/25/12 2014

## 2012-02-16 ENCOUNTER — Inpatient Hospital Stay (HOSPITAL_COMMUNITY)
Admission: AD | Admit: 2012-02-16 | Discharge: 2012-02-16 | Disposition: A | Discharge status: Home / Self Care | Payer: BC Managed Care – PPO | Source: Ambulatory Visit | Attending: Obstetrics and Gynecology | Admitting: Obstetrics and Gynecology

## 2012-02-16 ENCOUNTER — Encounter (HOSPITAL_COMMUNITY): Payer: Self-pay | Admitting: *Deleted

## 2012-02-16 ENCOUNTER — Inpatient Hospital Stay (HOSPITAL_COMMUNITY): Payer: BC Managed Care – PPO

## 2012-02-16 DIAGNOSIS — A499 Bacterial infection, unspecified: Secondary | ICD-10-CM | POA: Insufficient documentation

## 2012-02-16 DIAGNOSIS — R109 Unspecified abdominal pain: Secondary | ICD-10-CM | POA: Insufficient documentation

## 2012-02-16 DIAGNOSIS — B9689 Other specified bacterial agents as the cause of diseases classified elsewhere: Secondary | ICD-10-CM | POA: Insufficient documentation

## 2012-02-16 DIAGNOSIS — Z3490 Encounter for supervision of normal pregnancy, unspecified, unspecified trimester: Secondary | ICD-10-CM

## 2012-02-16 DIAGNOSIS — O3410 Maternal care for benign tumor of corpus uteri, unspecified trimester: Secondary | ICD-10-CM | POA: Insufficient documentation

## 2012-02-16 DIAGNOSIS — O2390 Unspecified genitourinary tract infection in pregnancy, unspecified trimester: Secondary | ICD-10-CM | POA: Insufficient documentation

## 2012-02-16 DIAGNOSIS — D259 Leiomyoma of uterus, unspecified: Secondary | ICD-10-CM | POA: Insufficient documentation

## 2012-02-16 DIAGNOSIS — N760 Acute vaginitis: Secondary | ICD-10-CM | POA: Insufficient documentation

## 2012-02-16 DIAGNOSIS — O26899 Other specified pregnancy related conditions, unspecified trimester: Secondary | ICD-10-CM

## 2012-02-16 HISTORY — DX: Benign neoplasm of connective and other soft tissue, unspecified: D21.9

## 2012-02-16 HISTORY — DX: Unspecified abnormal cytological findings in specimens from cervix uteri: R87.619

## 2012-02-16 HISTORY — DX: Reserved for concepts with insufficient information to code with codable children: IMO0002

## 2012-02-16 LAB — WET PREP, GENITAL
Trich, Wet Prep: NONE SEEN
Yeast Wet Prep HPF POC: NONE SEEN

## 2012-02-16 LAB — URINALYSIS, ROUTINE W REFLEX MICROSCOPIC
Bilirubin Urine: NEGATIVE
Glucose, UA: NEGATIVE mg/dL
Hgb urine dipstick: NEGATIVE
Ketones, ur: NEGATIVE mg/dL
Protein, ur: NEGATIVE mg/dL

## 2012-02-16 LAB — CBC WITH DIFFERENTIAL/PLATELET
Basophils Absolute: 0 10*3/uL (ref 0.0–0.1)
Basophils Relative: 0 % (ref 0–1)
Eosinophils Relative: 6 % — ABNORMAL HIGH (ref 0–5)
HCT: 37.1 % (ref 36.0–46.0)
Hemoglobin: 12.5 g/dL (ref 12.0–15.0)
MCH: 30.3 pg (ref 26.0–34.0)
MCHC: 33.7 g/dL (ref 30.0–36.0)
MCV: 89.8 fL (ref 78.0–100.0)
Monocytes Absolute: 0.9 10*3/uL (ref 0.1–1.0)
Monocytes Relative: 12 % (ref 3–12)
RDW: 12.6 % (ref 11.5–15.5)

## 2012-02-16 LAB — URINE MICROSCOPIC-ADD ON

## 2012-02-16 MED ORDER — METRONIDAZOLE 500 MG PO TABS: 500.0000 mg | ORAL_TABLET | Freq: Two times a day (BID) | ORAL | Status: DC

## 2012-02-16 NOTE — MAU Note (Signed)
Pt unsure of LMP

## 2012-02-16 NOTE — MAU Note (Signed)
abd pain, cramping started last night.  +Preg test at [facility] on 01/06.  Denies any bleeding.

## 2012-02-16 NOTE — MAU Provider Note (Signed)
History     CSN: [id number]  Arrival date and time: 02/16/12 1715   First Provider Initiated Contact with Patient 02/16/12 1745      Chief Complaint  Patient presents with  . Abdominal Pain   HPI Paula Cervantes is 37 y.o. G1P0 Unknown weeks presenting with lower abdominal pain since last night describes as intermittent.  Nothing seems to make it worse or better.  She is unsure of LMP but thinks it may be in October.  Does not use contraception because she was told she couldn't get pregnant but not sure why. Had + UPT at [facility] 02/09/12.   Denies vaginal bleeding  Patient of Dr. Dennie Bible.  Has tried tylenol without relief.  Denies nausea and vomiting.  Good appetite today.  Last BM last night-normal.     Past Medical History  Diagnosis Date  . Asthma     Past Surgical History  Procedure Date  . Ovarian fibroids   . Knee surgery     No family history on file.  History  Substance Use Topics  . Smoking status: Never Smoker   . Smokeless tobacco: Not on file  . Alcohol Use: No    Allergies:  Allergies  Allergen Reactions  . Penicillins Nausea And Vomiting and Other (See Comments)    Caused patient to pass out.   . Aspirin Hives  . Coconut Flavor Hives  . Sulfa Antibiotics Other (See Comments)    Blisters in mouth and throat    Prescriptions prior to admission  Medication Sig Dispense Refill  . acetaminophen (TYLENOL) 500 MG tablet Take 1,000 mg by mouth every 6 (six) hours as needed. For pain      . folic acid (FOLVITE) 1 MG tablet Take 1 mg by mouth daily.        Review of Systems  Constitutional: Negative.   Respiratory: Negative.   Cardiovascular: Negative.   Gastrointestinal: Positive for abdominal pain (lower abdominal pain, intermittent). Negative for diarrhea and constipation.  Genitourinary:       Neg for vaginal bleeding  Neurological: Negative.    Physical Exam   Blood pressure 120/56, pulse 90, temperature 98.1 F (36.7 C), temperature  source Oral, resp. rate 20, height 5' 5.5" (1.664 m), weight 201 lb (91.173 kg).  Physical Exam  Constitutional: She is oriented to person, place, and time. She appears well-developed and well-nourished. No distress.  HENT:  Head: Normocephalic.  Neck: Normal range of motion.  Cardiovascular: Normal rate.   Respiratory: Effort normal.  GI: Soft. She exhibits no distension and no mass. There is tenderness (mild lower quadrants). There is no rebound and no guarding.  Genitourinary: There is no rash, tenderness or lesion on the right labia. There is no rash, tenderness or lesion on the left labia. Uterus is enlarged. Uterus is not tender. Right adnexum displays tenderness ( mild). Right adnexum displays no mass and no fullness. Left adnexum displays tenderness (mild). Left adnexum displays no mass and no fullness. No bleeding around the vagina. Vaginal discharge (small amount of discharge with odor) found.  Neurological: She is alert and oriented to person, place, and time.  Skin: Skin is warm and dry.  Psychiatric: She has a normal mood and affect. Her behavior is normal.   Technique: Transabdominal ultrasound was performed for evaluation  of the gestation as well as the maternal uterus and adnexal  regions.  Comparison: None.  Intrauterine gestational sac: Single gestational sac is fundal.  Yolk sac: Present.  Embryo: Present.  Cardiac Activity: Present.  Heart Rate: 176 bpm  CRL: 1.6 cm eight w zero d [place] EDC: 09/27/2012  Maternal uterus/Adnexae:  No subchorionic hemorrhage. Ovaries are within normal limits.  Large fundal fibroid measures 6.9 x 6.5 x 5.3 cm.  IMPRESSION:  Live intrauterine pregnancy with an estimated gestational age of [redacted]  weeks and 0 days. Fetal heart rate is 176 beats per minute.  Large uterine fibroid.  Original Report Authenticated By: Jolaine Click, M.D.    Results for orders placed during the hospital encounter of 02/16/12 (from the past 24 hour(s))  URINALYSIS,  ROUTINE W REFLEX MICROSCOPIC     Status: Abnormal   Collection Time   02/16/12  5:30 PM      Component Value Range   Color, Urine YELLOW  YELLOW   APPearance HAZY (*) CLEAR   Specific Gravity, Urine 1.025  1.005 - 1.030   pH 7.0  5.0 - 8.0   Glucose, UA NEGATIVE  NEGATIVE mg/dL   Hgb urine dipstick NEGATIVE  NEGATIVE   Bilirubin Urine NEGATIVE  NEGATIVE   Ketones, ur NEGATIVE  NEGATIVE mg/dL   Protein, ur NEGATIVE  NEGATIVE mg/dL   Urobilinogen, UA 0.2  0.0 - 1.0 mg/dL   Nitrite NEGATIVE  NEGATIVE   Leukocytes, UA SMALL (*) NEGATIVE  URINE MICROSCOPIC-ADD ON     Status: Abnormal   Collection Time   02/16/12  5:30 PM      Component Value Range   Squamous Epithelial / LPF MANY (*) RARE   WBC, UA 11-20  <3 WBC/hpf   RBC / HPF 0-2  <3 RBC/hpf   Bacteria, UA MANY (*) RARE   Urine-Other MUCOUS PRESENT    CBC WITH DIFFERENTIAL     Status: Abnormal   Collection Time   02/16/12  6:00 PM      Component Value Range   WBC 7.6  4.0 - 10.5 K/uL   RBC 4.13  3.87 - 5.11 MIL/uL   Hemoglobin 12.5  12.0 - 15.0 g/dL   HCT 29.5  62.1 - 30.8 %   MCV 89.8  78.0 - 100.0 fL   MCH 30.3  26.0 - 34.0 pg   MCHC 33.7  30.0 - 36.0 g/dL   RDW 65.7  84.6 - 96.2 %   Platelets 303  150 - 400 K/uL   Neutrophils Relative 48  43 - 77 %   Neutro Abs 3.6  1.7 - 7.7 K/uL   Lymphocytes Relative 34  12 - 46 %   Lymphs Abs 2.6  0.7 - 4.0 K/uL   Monocytes Relative 12  3 - 12 %   Monocytes Absolute 0.9  0.1 - 1.0 K/uL   Eosinophils Relative 6 (*) 0 - 5 %   Eosinophils Absolute 0.4  0.0 - 0.7 K/uL   Basophils Relative 0  0 - 1 %   Basophils Absolute 0.0  0.0 - 0.1 K/uL  ABO/RH     Status: Normal (Preliminary result)   Collection Time   02/16/12  6:00 PM      Component Value Range   ABO/RH(D) A POS    HCG, QUANTITATIVE, PREGNANCY     Status: Abnormal   Collection Time   02/16/12  6:01 PM      Component Value Range   hCG, Beta Chain, Quant, S 64172 (*) <5 mIU/mL  WET PREP, GENITAL     Status: Abnormal    Collection Time   02/16/12  6:20 PM      Component  Value Range   Yeast Wet Prep HPF POC NONE SEEN  NONE SEEN   Trich, Wet Prep NONE SEEN  NONE SEEN   Clue Cells Wet Prep HPF POC MODERATE (*) NONE SEEN   WBC, Wet Prep HPF POC FEW (*) NONE SEEN   MAU Course  Procedures  GC/CHL culture to lab  MDM 17:56  Reported MSE to Dr. Arelia Sneddon.  Order given for labs and pelvic exam.  To call him with results. 17:40  Reported labs and ultrasound reported to Dr. Arelia Sneddon.  Will treat BV and Follow up in the office this week.  Assessment and Plan  A:  Viable intrauterine pregnancy at [redacted]wks gestation     Abdominal pain in first trimester     Bacterial vaginosis     Uterine fibroid  P:  Rx for Flagyl 500mg  bid X 1 week      Tylenol prn for cramping      Instructed to make appointment tomorrow to be seen this week.  KEY,EVE M 02/16/2012, 5:46 PM

## 2012-02-18 LAB — URINE CULTURE

## 2012-02-20 LAB — OB RESULTS CONSOLE ANTIBODY SCREEN: Antibody Screen: NEGATIVE

## 2012-02-20 LAB — OB RESULTS CONSOLE HIV ANTIBODY (ROUTINE TESTING): HIV: NONREACTIVE

## 2012-02-20 LAB — OB RESULTS CONSOLE RUBELLA ANTIBODY, IGM: Rubella: IMMUNE

## 2012-02-25 NOTE — ED Provider Notes (Signed)
Medical screening examination/treatment/procedure(s) were performed by non-physician practitioner and as supervising physician I was immediately available for consultation/collaboration.   Yassmine Tamm, MD 02/25/12 2141 

## 2012-06-01 ENCOUNTER — Inpatient Hospital Stay (HOSPITAL_COMMUNITY)
Admission: AD | Admit: 2012-06-01 | Discharge: 2012-06-01 | Disposition: A | Discharge status: Home / Self Care | Payer: BC Managed Care – PPO | Source: Ambulatory Visit | Attending: Obstetrics and Gynecology | Admitting: Obstetrics and Gynecology

## 2012-06-01 ENCOUNTER — Encounter (HOSPITAL_COMMUNITY): Payer: Self-pay | Admitting: *Deleted

## 2012-06-01 DIAGNOSIS — O4702 False labor before 37 completed weeks of gestation, second trimester: Secondary | ICD-10-CM

## 2012-06-01 DIAGNOSIS — R1031 Right lower quadrant pain: Secondary | ICD-10-CM | POA: Insufficient documentation

## 2012-06-01 DIAGNOSIS — O4700 False labor before 37 completed weeks of gestation, unspecified trimester: Secondary | ICD-10-CM | POA: Insufficient documentation

## 2012-06-01 LAB — URINALYSIS, ROUTINE W REFLEX MICROSCOPIC
Bilirubin Urine: NEGATIVE
Glucose, UA: NEGATIVE mg/dL
Hgb urine dipstick: NEGATIVE
Ketones, ur: NEGATIVE mg/dL
Protein, ur: NEGATIVE mg/dL

## 2012-06-01 LAB — CBC
HCT: 34.1 % — ABNORMAL LOW (ref 36.0–46.0)
Hemoglobin: 11.3 g/dL — ABNORMAL LOW (ref 12.0–15.0)
MCHC: 33.1 g/dL (ref 30.0–36.0)
MCV: 90.9 fL (ref 78.0–100.0)
RDW: 13.2 % (ref 11.5–15.5)

## 2012-06-01 NOTE — MAU Note (Signed)
Pt states on her rt lower abd for 30-45 min-states it comes and goes

## 2012-06-01 NOTE — MAU Provider Note (Signed)
Chief Complaint:  Abdominal Pain  First Provider Initiated Contact with Patient 06/01/12 2242    HPI: Paula Cervantes is a 37 y.o. G1P0 at [redacted]w[redacted]d who presents to maternity admissions reporting intermittent right groin pain since this evening and "the baby balling up" roughly C/W pain every 18 minutes. Has triad anything for the pain. There are no aggravating or aleviating factors. Denies leakage of fluid, vaginal discharge, urinary complaints, GI complaints, loss of appetite, fever, chills or vaginal bleeding. Good fetal movement.   Past Medical History: Past Medical History  Diagnosis Date  . Asthma   . Abnormal Pap smear   . Fibroids     Past obstetric history: OB History   Grav Para Term Preterm Abortions TAB SAB Ect Mult Living   1         0     # Outc Date GA Lbr Len/2nd Wgt Sex Del Anes PTL Lv   1 CUR               Past Surgical History: Past Surgical History  Procedure Laterality Date  . Ovarian fibroids    . Knee surgery    . No past surgeries      Family History: History reviewed. No pertinent family history.  Social History: History  Substance Use Topics  . Smoking status: Never Smoker   . Smokeless tobacco: Never Used  . Alcohol Use: No    Allergies:  Allergies  Allergen Reactions  . Penicillins Nausea And Vomiting and Other (See Comments)    Caused patient to pass out.   . Aspirin Hives  . Coconut Flavor Hives  . Sulfa Antibiotics Other (See Comments)    Blisters in mouth and throat    Meds:  Prescriptions prior to admission  Medication Sig Dispense Refill  . acetaminophen (TYLENOL) 500 MG tablet Take 1,000 mg by mouth every 6 (six) hours as needed. For pain      . folic acid (FOLVITE) 1 MG tablet Take 1 mg by mouth daily.      . Prenatal Vit-Fe Fumarate-FA (PRENATAL MULTIVITAMIN) TABS Take 1 tablet by mouth daily at 12 noon.      . metroNIDAZOLE (FLAGYL) 500 MG tablet Take 1 tablet (500 mg total) by mouth 2 (two) times daily.  14 tablet  0     ROS: Pertinent findings in history of present illness.  Physical Exam  Blood pressure 124/58, pulse 106, temperature 98.8 F (37.1 C), temperature source Oral, resp. rate 20, height 5\' 7"  (1.702 m), weight 92.987 kg (205 lb), SpO2 100.00%. GENERAL: Well-developed, well-nourished female in no acute distress.  HEENT: normocephalic HEART: normal rate RESP: normal effort ABDOMEN: Soft, gravid appropriate for gestational age. Mild right groin tenderness. No rebound or mass. Pos BS x 4. EXTREMITIES: Nontender, no edema NEURO: alert and oriented SPECULUM EXAM: NEFG, physiologic discharge, no blood, cervix clean   Dilation: Closed Effacement (%): Thick Cervical Position: Posterior Presentation: Undeterminable Exam by:: V.Develle Sievers,CNM  FHT:  Baseline 150 , moderate variability, no accelerations present, no decelerations Contractions: ~5/hr. Pt not aware of all UC's tracing.    Labs: Results for orders placed during the hospital encounter of 06/01/12 (from the past 24 hour(s))  URINALYSIS, ROUTINE W REFLEX MICROSCOPIC     Status: None   Collection Time    06/01/12  9:50 PM      Result Value Range   Color, Urine YELLOW  YELLOW   APPearance CLEAR  CLEAR   Specific Gravity, Urine 1.010  1.005 -  1.030   pH 6.5  5.0 - 8.0   Glucose, UA NEGATIVE  NEGATIVE mg/dL   Hgb urine dipstick NEGATIVE  NEGATIVE   Bilirubin Urine NEGATIVE  NEGATIVE   Ketones, ur NEGATIVE  NEGATIVE mg/dL   Protein, ur NEGATIVE  NEGATIVE mg/dL   Urobilinogen, UA 0.2  0.0 - 1.0 mg/dL   Nitrite NEGATIVE  NEGATIVE   Leukocytes, UA NEGATIVE  NEGATIVE  CBC     Status: Abnormal   Collection Time    06/01/12 11:10 PM      Result Value Range   WBC 12.1 (*) 4.0 - 10.5 K/uL   RBC 3.75 (*) 3.87 - 5.11 MIL/uL   Hemoglobin 11.3 (*) 12.0 - 15.0 g/dL   HCT 16.1 (*) 09.6 - 04.5 %   MCV 90.9  78.0 - 100.0 fL   MCH 30.1  26.0 - 34.0 pg   MCHC 33.1  30.0 - 36.0 g/dL   RDW 40.9  81.1 - 91.4 %   Platelets 275  150 - 400 K/uL     Imaging:  No results found.  MAU Course: UC's decreased w/ PO fluids. Ibuprofen given.   Assessment: 1. Preterm uterine contractions, second trimester   RLQ pain w/ low suspicion for appendicitis due to lack of fever and GI Sx, nature of pain. Possible round ligament pain.   Plan: Discharge home in stable condition per consult w/ Dr. Henderson Cloud. Preterm labor precautions and fetal kick counts. Appendicitis precautions. Increase fluids rest.    Medication List    STOP taking these medications       metroNIDAZOLE 500 MG tablet  Commonly known as:  FLAGYL      TAKE these medications       acetaminophen 500 MG tablet  Commonly known as:  TYLENOL  Take 1,000 mg by mouth every 6 (six) hours as needed. For pain     folic acid 1 MG tablet  Commonly known as:  FOLVITE  Take 1 mg by mouth daily.     ibuprofen 600 MG tablet  Commonly known as:  ADVIL,MOTRIN  Take 1 tablet (600 mg total) by mouth every 6 (six) hours. X 24 hours     prenatal multivitamin Tabs  Take 1 tablet by mouth daily at 12 noon.       Follow-up Information   Follow up with [facility] [place].. (as scheduled)    Contact information:   [address] [place] [address] [phone]      Follow up with [facility] [place] MATERNITY ADMISSIONS. (As needed if symptoms worsen)    Contact information:   [address] [place] [address] [phone]     Dorathy Kinsman, [place] 06/01/2012 11:07 PM

## 2012-06-02 DIAGNOSIS — O479 False labor, unspecified: Secondary | ICD-10-CM

## 2012-06-02 MED ORDER — IBUPROFEN 600 MG PO TABS: 600.0000 mg | ORAL_TABLET | Freq: Four times a day (QID) | ORAL | Status: AC

## 2012-06-02 MED ORDER — IBUPROFEN 600 MG PO TABS
600.0000 mg | ORAL_TABLET | Freq: Once | ORAL | Status: AC
  Administered 2012-06-02: 600 mg via ORAL
  Filled 2012-06-02: qty 1

## 2012-06-10 ENCOUNTER — Encounter (HOSPITAL_COMMUNITY): Payer: Self-pay | Admitting: Emergency Medicine

## 2012-06-10 ENCOUNTER — Emergency Department (HOSPITAL_COMMUNITY)
Admission: EM | Admit: 2012-06-10 | Discharge: 2012-06-11 | Disposition: A | Discharge status: Home / Self Care | Payer: BC Managed Care – PPO | Attending: Emergency Medicine | Admitting: Emergency Medicine

## 2012-06-10 DIAGNOSIS — Z87891 Personal history of nicotine dependence: Secondary | ICD-10-CM | POA: Insufficient documentation

## 2012-06-10 DIAGNOSIS — Z8742 Personal history of other diseases of the female genital tract: Secondary | ICD-10-CM | POA: Insufficient documentation

## 2012-06-10 DIAGNOSIS — R21 Rash and other nonspecific skin eruption: Secondary | ICD-10-CM | POA: Insufficient documentation

## 2012-06-10 DIAGNOSIS — Z880 Allergy status to penicillin: Secondary | ICD-10-CM | POA: Insufficient documentation

## 2012-06-10 DIAGNOSIS — L509 Urticaria, unspecified: Secondary | ICD-10-CM | POA: Insufficient documentation

## 2012-06-10 DIAGNOSIS — Z79899 Other long term (current) drug therapy: Secondary | ICD-10-CM | POA: Insufficient documentation

## 2012-06-10 DIAGNOSIS — J45909 Unspecified asthma, uncomplicated: Secondary | ICD-10-CM | POA: Insufficient documentation

## 2012-06-10 NOTE — ED Notes (Signed)
Patient with hives all over body, on face to feet.  Patient states that she has been itching all day, patient did take 50mg  Benadryl around 3pm and another 50mg  at 1930, no relief of the itching or hives.  Patient has continues with hives.  Denies any shortness of breath, no oral swelling.  Patient is CAOx3.

## 2012-06-11 MED ORDER — DIPHENHYDRAMINE HCL 50 MG/ML IJ SOLN
25.0000 mg | Freq: Once | INTRAMUSCULAR | Status: AC
  Administered 2012-06-11: 25 mg via INTRAMUSCULAR
  Filled 2012-06-11: qty 1

## 2012-06-11 MED ORDER — DEXAMETHASONE SODIUM PHOSPHATE 10 MG/ML IJ SOLN
10.0000 mg | Freq: Once | INTRAMUSCULAR | Status: AC
  Administered 2012-06-11: 10 mg via INTRAMUSCULAR
  Filled 2012-06-11: qty 1

## 2012-06-11 MED ORDER — FAMOTIDINE 20 MG PO TABS
20.0000 mg | ORAL_TABLET | Freq: Once | ORAL | Status: AC
  Administered 2012-06-11: 20 mg via ORAL
  Filled 2012-06-11: qty 1

## 2012-06-11 MED ORDER — PREDNISONE 20 MG PO TABS: ORAL_TABLET | ORAL | Status: DC

## 2012-06-11 MED ORDER — FAMOTIDINE 20 MG PO TABS: 20.0000 mg | ORAL_TABLET | Freq: Two times a day (BID) | ORAL | Status: DC

## 2012-06-11 NOTE — ED Notes (Signed)
Pt discharged.Vital signs stable and GCS 15 

## 2012-06-11 NOTE — ED Provider Notes (Signed)
History     CSN: [id number]  Arrival date & time 06/10/12  2337   First MD Initiated Contact with Patient 06/11/12 0000      Chief Complaint  Patient presents with  . Urticaria    (Consider location/radiation/quality/duration/timing/severity/associated sxs/prior treatment) Patient is a 37 y.o. female presenting with urticaria. The history is provided by the patient. No language interpreter was used.  Urticaria This is a new problem. The current episode started 6 to 12 hours ago. The problem occurs constantly. The problem has not changed since onset.Pertinent negatives include no chest pain, no abdominal pain, no headaches and no shortness of breath. Nothing aggravates the symptoms. Nothing relieves the symptoms. She has tried nothing for the symptoms. The treatment provided no relief.    Past Medical History  Diagnosis Date  . Asthma   . Abnormal Pap smear   . Fibroids     Past Surgical History  Procedure Laterality Date  . Ovarian fibroids    . Knee surgery    . No past surgeries      History reviewed. No pertinent family history.  History  Substance Use Topics  . Smoking status: Former Games developer  . Smokeless tobacco: Never Used  . Alcohol Use: No    OB History   Grav Para Term Preterm Abortions TAB SAB Ect Mult Living   1         0      Review of Systems  Respiratory: Negative for shortness of breath.   Cardiovascular: Negative for chest pain.  Gastrointestinal: Negative for abdominal pain.  Skin: Positive for rash.  Neurological: Negative for headaches.  All other systems reviewed and are negative.    Allergies  Penicillins; Aspirin; Coconut flavor; and Sulfa antibiotics  Home Medications   Current Outpatient Rx  Name  Route  Sig  Dispense  Refill  . acetaminophen (TYLENOL) 500 MG tablet   Oral   Take 1,000 mg by mouth every 6 (six) hours as needed. For pain         . folic acid (FOLVITE) 1 MG tablet   Oral   Take 1 mg by mouth daily.         . Prenatal Vit-Fe Fumarate-FA (PRENATAL MULTIVITAMIN) TABS   Oral   Take 1 tablet by mouth daily at 12 noon.           BP 138/68  Pulse 106  Temp(Src) 97.8 F (36.6 C) (Oral)  Resp 18  SpO2 94%  Physical Exam  Constitutional: She is oriented to person, place, and time. She appears well-developed and well-nourished. No distress.  HENT:  Head: Normocephalic and atraumatic.  No swelling or the lips tongue or uvula  Eyes: Conjunctivae are normal. Pupils are equal, round, and reactive to light.  Neck: Normal range of motion. Neck supple.  Cardiovascular: Normal rate and regular rhythm.   Pulmonary/Chest: Effort normal and breath sounds normal. No stridor. No respiratory distress. She has no wheezes. She has no rales.  Abdominal: Soft. Bowel sounds are normal. There is no tenderness. There is no rebound and no guarding.  Musculoskeletal: Normal range of motion.  Neurological: She is alert and oriented to person, place, and time.  Skin: Skin is warm and dry.  Psychiatric: She has a normal mood and affect.    ED Course  Procedures (including critical care time)  Labs Reviewed - No data to display No results found.   No diagnosis found.    MDM  Will treat for allergic  reaction      Lesions improving patient still feels itchy  Will treat x 5 days  Temitope Griffing K Juaquin Ludington-Rasch, MD 06/11/12 0150

## 2012-06-16 ENCOUNTER — Encounter (HOSPITAL_COMMUNITY): Payer: Self-pay

## 2012-06-16 ENCOUNTER — Emergency Department (HOSPITAL_COMMUNITY)
Admission: EM | Admit: 2012-06-16 | Discharge: 2012-06-16 | Disposition: A | Discharge status: Home / Self Care | Payer: BC Managed Care – PPO | Attending: Emergency Medicine | Admitting: Emergency Medicine

## 2012-06-16 DIAGNOSIS — Z880 Allergy status to penicillin: Secondary | ICD-10-CM | POA: Insufficient documentation

## 2012-06-16 DIAGNOSIS — Z882 Allergy status to sulfonamides status: Secondary | ICD-10-CM | POA: Insufficient documentation

## 2012-06-16 DIAGNOSIS — T50Z95A Adverse effect of other vaccines and biological substances, initial encounter: Secondary | ICD-10-CM | POA: Insufficient documentation

## 2012-06-16 DIAGNOSIS — Z87891 Personal history of nicotine dependence: Secondary | ICD-10-CM | POA: Insufficient documentation

## 2012-06-16 DIAGNOSIS — Z888 Allergy status to other drugs, medicaments and biological substances status: Secondary | ICD-10-CM | POA: Insufficient documentation

## 2012-06-16 DIAGNOSIS — Z8742 Personal history of other diseases of the female genital tract: Secondary | ICD-10-CM | POA: Insufficient documentation

## 2012-06-16 DIAGNOSIS — Z331 Pregnant state, incidental: Secondary | ICD-10-CM | POA: Insufficient documentation

## 2012-06-16 DIAGNOSIS — L509 Urticaria, unspecified: Secondary | ICD-10-CM

## 2012-06-16 DIAGNOSIS — J45909 Unspecified asthma, uncomplicated: Secondary | ICD-10-CM | POA: Insufficient documentation

## 2012-06-16 MED ORDER — DIPHENHYDRAMINE HCL 50 MG/ML IJ SOLN
25.0000 mg | Freq: Once | INTRAMUSCULAR | Status: DC
  Filled 2012-06-16: qty 1

## 2012-06-16 MED ORDER — FAMOTIDINE IN NACL 20-0.9 MG/50ML-% IV SOLN
20.0000 mg | Freq: Once | INTRAVENOUS | Status: AC
  Administered 2012-06-16: 20 mg via INTRAVENOUS
  Filled 2012-06-16: qty 50

## 2012-06-16 NOTE — Progress Notes (Signed)
Dr. Arelia Sneddon notified of PAs request for steriods for allergic reaction. [place] stated that this is safe for pregnancy.

## 2012-06-16 NOTE — ED Notes (Signed)
Obstetrics nurse at bedside for fetal heart monitoring.

## 2012-06-16 NOTE — ED Notes (Signed)
Notified OB Rapid Response RN 

## 2012-06-16 NOTE — ED Notes (Signed)
Pt arrived [facility] from work. Pt developed sudden onset of rash and SOB. Pt experienced this same episode approx 1 week ago. Placed on PO steroids for 7 days. Finished yesterday. Pt is [redacted] weeks pregnant.   Albuterol 5 mg Nebulizer administered by EMS PTA Benadryl 50 mg IVP administered by EMS PTA

## 2012-06-16 NOTE — Progress Notes (Signed)
Dr. Arelia Sneddon notified of pt being seen at [facility] ED for allergic reaction. Stated that pt could have benedryl and do do fetal monitoring for 30 to verify fetal wellbeing.

## 2012-06-16 NOTE — ED Provider Notes (Signed)
History     CSN: [id number]  Arrival date & time 06/16/12  1714   First MD Initiated Contact with Patient 06/16/12 1730      Chief Complaint  Patient presents with  . Allergic Reaction    (Consider location/radiation/quality/duration/timing/severity/associated sxs/prior treatment) Patient is a 37 y.o. female presenting with allergic reaction. The history is provided by the patient.  Allergic Reaction The primary symptoms are  urticaria. The primary symptoms do not include wheezing, shortness of breath, cough, abdominal pain, nausea, vomiting, diarrhea, dizziness, palpitations, altered mental status or angioedema. The current episode started 3 to 5 hours ago. The problem has not changed since onset. The urticaria began 3 to 5 hours ago. The urticaria has been unchanged since its onset. Location: Right arm, right foot and stomach   Significant symptoms also include itching. Significant symptoms that are not present include eye redness, flushing or rhinorrhea.    Past Medical History  Diagnosis Date  . Asthma   . Abnormal Pap smear   . Fibroids     Past Surgical History  Procedure Laterality Date  . Ovarian fibroids    . Knee surgery    . No past surgeries      No family history on file.  History  Substance Use Topics  . Smoking status: Former Games developer  . Smokeless tobacco: Never Used  . Alcohol Use: No    OB History   Grav Para Term Preterm Abortions TAB SAB Ect Mult Living   1         0      Review of Systems  HENT: Negative for sore throat, rhinorrhea, drooling and trouble swallowing.   Eyes: Negative for redness.  Respiratory: Negative for cough, shortness of breath and wheezing.   Cardiovascular: Negative for palpitations.  Gastrointestinal: Negative for nausea, vomiting, abdominal pain and diarrhea.  Skin: Positive for itching. Negative for flushing.  Neurological: Negative for dizziness.  Psychiatric/Behavioral: Negative for altered mental status.  All  other systems reviewed and are negative.    Allergies  Penicillins; Aspirin; Coconut flavor; and Sulfa antibiotics  Home Medications   Current Outpatient Rx  Name  Route  Sig  Dispense  Refill  . acetaminophen (TYLENOL) 500 MG tablet   Oral   Take 1,000 mg by mouth every 6 (six) hours as needed. For pain         . folic acid (FOLVITE) 1 MG tablet   Oral   Take 1 mg by mouth daily.         Marland Kitchen ibuprofen (ADVIL,MOTRIN) 200 MG tablet   Oral   Take 200 mg by mouth every 6 (six) hours as needed for pain.         . Prenatal Vit-Fe Fumarate-FA (PRENATAL MULTIVITAMIN) TABS   Oral   Take 1 tablet by mouth daily at 12 noon.           BP 126/60  Pulse 108  Temp(Src) 98.9 F (37.2 C) (Oral)  SpO2 96%  Physical Exam  Nursing note and vitals reviewed. Constitutional: She is oriented to person, place, and time. She appears well-developed and well-nourished. No distress.  HENT:  Head: Normocephalic and atraumatic.  Mouth/Throat: Oropharynx is clear and moist and mucous membranes are normal.  No sign of airway obstruction. No edema of face, eyelids, lips, tongue, uvula.Marland Kitchen Uvula midline, no nasal congestion or drooling.  Tongue not elevated. No trismus.  Neck: Trachea normal, normal range of motion and full passive range of motion without pain.  Neck supple. Carotid bruit is not present. No tracheal deviation present.  No carotid bruits or stridor  Cardiovascular: Normal rate, regular rhythm, intact distal pulses and normal pulses.   Not tachycardic  Pulmonary/Chest: Effort normal. No stridor.  Musculoskeletal: Normal range of motion.  Neurological: She is alert and oriented to person, place, and time.  Skin: Skin is warm and intact. Rash noted. Rash is urticarial. She is not diaphoretic.  Not diaphoretic. Raised erythematous welts, pruritic in nature, located on right arm, leg and abdomen. Blanchable urticaria, no petechiae or purpura.   Psychiatric: She has a normal mood and  affect. Her behavior is normal.    ED Course  Procedures (including critical care time)  Labs Reviewed - No data to display No results found.   No diagnosis found.    MDM  93 week old pregnant patient to ER for evaluation of urticaria. Pt observed in the ER for 3 hours without any worsening of symptoms. Pt re-evaluated prior to dc, is hemodynamically stable, in no respiratory distress, and denies the feeling of throat closing. Pt has been advised to take OTC benadryl, pepcid & return to the ED if they have a mod-severe allergic rxn (s/s including throat closing, difficulty breathing, swelling of lips face or tongue). Pt is to follow up with their PCP. Pt is agreeable with plan & verbalizes understanding. FHR normal in the ER. Pt was treated last week for similar presentation and finished taking prednisone yesterday, AS the reaction appears to be mild i do not feel that steroids are currently indicated.          Jaci Carrel, [place] 06/16/12 2028

## 2012-06-16 NOTE — ED Provider Notes (Signed)
Medical screening examination/treatment/procedure(s) were performed by non-physician practitioner and as supervising physician I was immediately available for consultation/collaboration.    Celene Kras, MD 06/16/12 2030

## 2012-06-16 NOTE — Progress Notes (Signed)
[facility] ED called regarding pt at 27 weeks in ED with allergic reaction.

## 2012-06-16 NOTE — Progress Notes (Signed)
EFM applied. Pt assessed. G1P0 with EDC 09/10/12 at [redacted]w[redacted]d. Pt with nickel sized whelps all over body. Pt states that she believes she is being exposed at work that is causing this problem. Pt states she has had benedryl in the ambulance.  No complications with pregnancy. Pt reports positive fetal movement, no abd pain or back pain. Denies leakage of fluid and no vaginal bleeding.

## 2012-07-31 ENCOUNTER — Encounter (HOSPITAL_COMMUNITY): Payer: Self-pay | Admitting: Family

## 2012-07-31 ENCOUNTER — Inpatient Hospital Stay (HOSPITAL_COMMUNITY)
Admission: AD | Admit: 2012-07-31 | Discharge: 2012-07-31 | Disposition: A | Discharge status: Home / Self Care | Payer: BC Managed Care – PPO | Source: Ambulatory Visit | Attending: Obstetrics and Gynecology | Admitting: Obstetrics and Gynecology

## 2012-07-31 DIAGNOSIS — O219 Vomiting of pregnancy, unspecified: Secondary | ICD-10-CM

## 2012-07-31 DIAGNOSIS — R197 Diarrhea, unspecified: Secondary | ICD-10-CM | POA: Insufficient documentation

## 2012-07-31 DIAGNOSIS — O212 Late vomiting of pregnancy: Secondary | ICD-10-CM | POA: Insufficient documentation

## 2012-07-31 LAB — URINALYSIS, ROUTINE W REFLEX MICROSCOPIC
Bilirubin Urine: NEGATIVE
Glucose, UA: NEGATIVE mg/dL
Hgb urine dipstick: NEGATIVE
Nitrite: NEGATIVE
Protein, ur: NEGATIVE mg/dL
Urobilinogen, UA: 0.2 mg/dL (ref 0.0–1.0)

## 2012-07-31 LAB — URINE MICROSCOPIC-ADD ON

## 2012-07-31 MED ORDER — PROMETHAZINE HCL 25 MG/ML IJ SOLN
25.0000 mg | Freq: Once | INTRAVENOUS | Status: AC
  Administered 2012-07-31: 25 mg via INTRAVENOUS
  Filled 2012-07-31: qty 1

## 2012-07-31 MED ORDER — PROMETHAZINE HCL 25 MG PO TABS
25.0000 mg | ORAL_TABLET | Freq: Four times a day (QID) | ORAL | Status: DC | PRN
Start: 2012-07-31 — End: 2012-08-25

## 2012-07-31 NOTE — MAU Provider Note (Signed)
History     CSN: [id number]  Arrival date and time: 07/31/12 4098   First Provider Initiated Contact with Patient 07/31/12 1720      Chief Complaint  Patient presents with  . Emesis   HPI Paula Cervantes 37 y.o. [redacted]w[redacted]d   Comes to MAU with vomiting x 3 days.  Has had URI and started Z pack earlier this week.  Had diarrhea yesterday.  No one else in the home is ill.  Requesting IVF as she is feeling so bad.   OB History   Grav Para Term Preterm Abortions TAB SAB Ect Mult Living   1         0      Past Medical History  Diagnosis Date  . Asthma   . Abnormal Pap smear   . Fibroids     Past Surgical History  Procedure Laterality Date  . Ovarian fibroids    . Knee surgery    . No past surgeries      History reviewed. No pertinent family history.  History  Substance Use Topics  . Smoking status: Former Games developer  . Smokeless tobacco: Never Used  . Alcohol Use: No    Allergies:  Allergies  Allergen Reactions  . Penicillins Nausea And Vomiting and Other (See Comments)    Caused patient to pass out.   . Aspirin Hives  . Coconut Flavor Hives  . Sulfa Antibiotics Other (See Comments)    Blisters in mouth and throat    Prescriptions prior to admission  Medication Sig Dispense Refill  . acetaminophen (TYLENOL) 500 MG tablet Take 1,000 mg by mouth every 6 (six) hours as needed. For pain      . azithromycin (ZITHROMAX) 250 MG tablet Take 250 mg by mouth daily.      Marland Kitchen DM-Phenylephrine-Acetaminophen (TYLENOL COLD MULTI-SYMPTOM) 10-5-325 MG/15ML LIQD Take 15 mLs by mouth at bedtime as needed (cold symptoms).      . Prenatal Vit-Fe Fumarate-FA (PRENATAL MULTIVITAMIN) TABS Take 1 tablet by mouth daily at 12 noon.        Review of Systems  Constitutional: Negative for fever.  Respiratory: Positive for cough.   Gastrointestinal: Positive for nausea, vomiting and diarrhea. Negative for abdominal pain and constipation.  Genitourinary: Negative for dysuria.   Physical Exam    Blood pressure 128/65, pulse 111, temperature 98.2 F (36.8 C), temperature source Oral, resp. rate 18, height 5\' 7"  (1.702 m), weight 210 lb 3.2 oz (95.346 kg).  Physical Exam  Nursing note and vitals reviewed. Constitutional: She is oriented to person, place, and time. She appears well-developed and well-nourished.  HENT:  Head: Normocephalic.  Eyes: EOM are normal.  Neck: Neck supple.  Respiratory:  Congested cough  Musculoskeletal: Normal range of motion.  Neurological: She is alert and oriented to person, place, and time.  Skin: Skin is warm and dry.  Psychiatric: She has a normal mood and affect.    MAU Course  Procedures  MDM Results for orders placed during the hospital encounter of 07/31/12 (from the past 24 hour(s))  URINALYSIS, ROUTINE W REFLEX MICROSCOPIC     Status: Abnormal   Collection Time    07/31/12  4:45 PM      Result Value Range   Color, Urine YELLOW  YELLOW   APPearance CLEAR  CLEAR   Specific Gravity, Urine 1.025  1.005 - 1.030   pH 7.5  5.0 - 8.0   Glucose, UA NEGATIVE  NEGATIVE mg/dL   Hgb urine dipstick NEGATIVE  NEGATIVE   Bilirubin Urine NEGATIVE  NEGATIVE   Ketones, ur NEGATIVE  NEGATIVE mg/dL   Protein, ur NEGATIVE  NEGATIVE mg/dL   Urobilinogen, UA 0.2  0.0 - 1.0 mg/dL   Nitrite NEGATIVE  NEGATIVE   Leukocytes, UA SMALL (*) NEGATIVE  URINE MICROSCOPIC-ADD ON     Status: Abnormal   Collection Time    07/31/12  4:45 PM      Result Value Range   Squamous Epithelial / LPF MANY (*) RARE   WBC, UA 7-10  <3 WBC/hpf   Bacteria, UA MANY (*) RARE   Urine-Other MUCOUS PRESENT     Client is able to take crackers by mouth after LR 1000 cc with Phenergan 25 mg.  Doing much better and would like some Phenergan tablets for home.  Assessment and Plan  Nausea and vomiting in pregnancy  Plan Finish Z pack Rx Phenergan 25 mg one half tablet to one tablet by mouth every 6 hours as needed for vomiting.  (#12) no refills Urine culture  pending. Follow up in the office if your symptoms worsen.  BURLESON,TERRI 07/31/2012, 5:26 PM

## 2012-07-31 NOTE — MAU Note (Signed)
Patient presents to MAU with c/o nasal congestion and cough since Wednesday. Reports N/V since Thursday. Unable to tolerate PO.  Reports taking Tylenol Multi-Symptom and began taking Z-pack yesterday. Denies fever, chills.  Reports +FM; denies vaginal bleeding, LOF or contractions.

## 2012-07-31 NOTE — MAU Note (Addendum)
Pt has had upper resp. Infection with cough stared on Z-pack.Pt reports she has been having n/v x 3 days. Had some diarrhea last night.  Can't keep anything down

## 2012-08-01 LAB — URINE CULTURE: Colony Count: 45000

## 2012-08-05 ENCOUNTER — Inpatient Hospital Stay (HOSPITAL_COMMUNITY): Payer: BC Managed Care – PPO

## 2012-08-05 ENCOUNTER — Encounter (HOSPITAL_COMMUNITY): Payer: Self-pay | Admitting: *Deleted

## 2012-08-05 ENCOUNTER — Inpatient Hospital Stay (HOSPITAL_COMMUNITY)
Admission: AD | Admit: 2012-08-05 | Discharge: 2012-08-05 | Disposition: A | Discharge status: Home / Self Care | Payer: BC Managed Care – PPO | Source: Ambulatory Visit | Attending: Obstetrics and Gynecology | Admitting: Obstetrics and Gynecology

## 2012-08-05 DIAGNOSIS — O4700 False labor before 37 completed weeks of gestation, unspecified trimester: Secondary | ICD-10-CM | POA: Insufficient documentation

## 2012-08-05 DIAGNOSIS — O368190 Decreased fetal movements, unspecified trimester, not applicable or unspecified: Secondary | ICD-10-CM | POA: Insufficient documentation

## 2012-08-05 DIAGNOSIS — O4703 False labor before 37 completed weeks of gestation, third trimester: Secondary | ICD-10-CM

## 2012-08-05 DIAGNOSIS — O479 False labor, unspecified: Secondary | ICD-10-CM

## 2012-08-05 LAB — URINALYSIS, ROUTINE W REFLEX MICROSCOPIC
Ketones, ur: NEGATIVE mg/dL
Leukocytes, UA: NEGATIVE
Nitrite: NEGATIVE
Protein, ur: NEGATIVE mg/dL
pH: 7.5 (ref 5.0–8.0)

## 2012-08-05 MED ORDER — MORPHINE SULFATE 4 MG/ML IJ SOLN
2.0000 mg | Freq: Once | INTRAMUSCULAR | Status: AC
  Administered 2012-08-05: 2 mg via INTRAMUSCULAR
  Filled 2012-08-05: qty 1

## 2012-08-05 MED ORDER — NITROFURANTOIN MONOHYD MACRO 100 MG PO CAPS: 100.0000 mg | ORAL_CAPSULE | Freq: Two times a day (BID) | ORAL | Status: DC

## 2012-08-05 NOTE — MAU Note (Signed)
Pt reports having contractions last night about 20 min part not painful. Got closer together this morning. Denies SROm or bleeding. Has not been feeling baby move as much today.

## 2012-08-05 NOTE — MAU Provider Note (Signed)
History     CSN: [id number]  Arrival date and time: 08/05/12 1436   None     Chief Complaint  Patient presents with  . Contractions   HPI Paula Cervantes is a 37 y.o. G1P0 female @ [redacted]w[redacted]d who presents via EMS from work w/ report of uc's that began this am and feels like they are intensifying and becoming more frequent. Reports decreased fm x 2 days. Denies lof or vb. Reports increased urinary frequency and urgency x 1 wk. Denies dysuria or hesitancy. Was seen in MAU on 6/28 for n/v, ongoing URI- on zpack, and was rx'd phenergan. Next appt at [facility] on 7/18.   OB History   Grav Para Term Preterm Abortions TAB SAB Ect Mult Living   1         0      Past Medical History  Diagnosis Date  . Asthma   . Abnormal Pap smear   . Fibroids     Past Surgical History  Procedure Laterality Date  . Ovarian fibroids    . Knee surgery    . No past surgeries      History reviewed. No pertinent family history.  History  Substance Use Topics  . Smoking status: Former Games developer  . Smokeless tobacco: Never Used  . Alcohol Use: No    Allergies:  Allergies  Allergen Reactions  . Penicillins Nausea And Vomiting and Other (See Comments)    Caused patient to pass out.   . Aspirin Hives  . Coconut Flavor Hives  . Sulfa Antibiotics Other (See Comments)    Blisters in mouth and throat    Prescriptions prior to admission  Medication Sig Dispense Refill  . acetaminophen (TYLENOL) 500 MG tablet Take 1,000 mg by mouth every 6 (six) hours as needed. For pain      . azithromycin (ZITHROMAX) 250 MG tablet Take 250 mg by mouth daily.      Marland Kitchen DM-Phenylephrine-Acetaminophen (TYLENOL COLD MULTI-SYMPTOM) 10-5-325 MG/15ML LIQD Take 15 mLs by mouth at bedtime as needed (cold symptoms).      . Prenatal Vit-Fe Fumarate-FA (PRENATAL MULTIVITAMIN) TABS Take 1 tablet by mouth daily at 12 noon.      . promethazine (PHENERGAN) 25 MG tablet Take 1 tablet (25 mg total) by mouth every 6 (six) hours  as needed for nausea. May take 1/2 tablet for milder symptoms.  May cause sedation.  12 tablet  0    Review of Systems  Constitutional: Negative.   HENT: Negative.   Eyes: Negative.   Respiratory: Negative.   Cardiovascular: Negative.   Gastrointestinal: Positive for abdominal pain (uc's).  Genitourinary: Positive for urgency and frequency. Negative for dysuria, hematuria and flank pain.  Musculoskeletal: Positive for back pain (intermittent low back pain w/ uc's).  Skin: Negative.   Neurological: Negative.   Endo/Heme/Allergies: Negative.   Psychiatric/Behavioral: Negative.    Physical Exam   Blood pressure 120/62, pulse 105, temperature 98.2 F (36.8 C), temperature source Oral, resp. rate 18.  Physical Exam  Constitutional: She is oriented to person, place, and time. She appears well-developed and well-nourished.  HENT:  Head: Normocephalic.  Neck: Normal range of motion.  Cardiovascular: Normal rate.   Respiratory: Effort normal.  GI: Soft.  gravid  Genitourinary:  SVE: LTC x 2  Musculoskeletal: Normal range of motion.  Neurological: She is alert and oriented to person, place, and time. She has normal reflexes.  Skin: Skin is warm and dry.  Psychiatric: She has a normal  mood and affect. Her behavior is normal. Judgment and thought content normal.   FHR: 130, mod variability, 15x15accels, one 1.43min variable to nadir of 100- resolved spontaneously, reactive and reassuring before and since UCs: mild, regular q 2-4 initially, then spaced out w/ lots of UI MAU Course  Procedures  NST SVE UA Morphine 2mg  IM x 1 BPP 8/8  1640: notified dr. Henderson Cloud of pt, sve x2 LTC, uc's, fhr reactive but w/ one 1.91min variable- he requested BPP, if 8/8 may d/c, if <8/8 notify him  Results for orders placed during the hospital encounter of 08/05/12 (from the past 24 hour(s))  URINALYSIS, ROUTINE W REFLEX MICROSCOPIC     Status: None   Collection Time    08/05/12  2:38 PM       Result Value Range   Color, Urine YELLOW  YELLOW   APPearance CLEAR  CLEAR   Specific Gravity, Urine 1.025  1.005 - 1.030   pH 7.5  5.0 - 8.0   Glucose, UA NEGATIVE  NEGATIVE mg/dL   Hgb urine dipstick NEGATIVE  NEGATIVE   Bilirubin Urine NEGATIVE  NEGATIVE   Ketones, ur NEGATIVE  NEGATIVE mg/dL   Protein, ur NEGATIVE  NEGATIVE mg/dL   Urobilinogen, UA 0.2  0.0 - 1.0 mg/dL   Nitrite NEGATIVE  NEGATIVE   Leukocytes, UA NEGATIVE  NEGATIVE   Assessment and Plan  A:  [redacted]w[redacted]d SIUP  G1P0   Preterm uc's  Decreased fm x 2 days  Reactive/reassuring FHR w/ BPP 8/8  Possible UTI- ua neg, but has symptoms w/ preterm uc's- so will treat  P:  D/C home  Reviewed ptl s/s, fetal kick counts, s/s to report, reasons to return  Rx Macrobid 100mg  BID x 7d  To increase po fluids to keep well hydrated, rest  Note to come out of work d/t preterm uc's until f/u visit  Call [facility] office Monday (tomorrow is holiday) to inform them of mau visit/schedule f/u sooner than 7/18  Marge Duncans 08/05/2012, 2:58 PM

## 2012-08-07 ENCOUNTER — Encounter: Payer: Self-pay | Admitting: Women's Health

## 2012-08-08 ENCOUNTER — Encounter (HOSPITAL_COMMUNITY): Payer: Self-pay | Admitting: *Deleted

## 2012-08-08 ENCOUNTER — Inpatient Hospital Stay (HOSPITAL_COMMUNITY)
Admission: AD | Admit: 2012-08-08 | Discharge: 2012-08-08 | Disposition: A | Discharge status: Home / Self Care | Payer: BC Managed Care – PPO | Source: Ambulatory Visit | Attending: Obstetrics and Gynecology | Admitting: Obstetrics and Gynecology

## 2012-08-08 DIAGNOSIS — B373 Candidiasis of vulva and vagina: Secondary | ICD-10-CM | POA: Insufficient documentation

## 2012-08-08 DIAGNOSIS — B3731 Acute candidiasis of vulva and vagina: Secondary | ICD-10-CM | POA: Insufficient documentation

## 2012-08-08 DIAGNOSIS — O4700 False labor before 37 completed weeks of gestation, unspecified trimester: Secondary | ICD-10-CM

## 2012-08-08 DIAGNOSIS — O2390 Unspecified genitourinary tract infection in pregnancy, unspecified trimester: Secondary | ICD-10-CM | POA: Insufficient documentation

## 2012-08-08 LAB — WET PREP, GENITAL
Clue Cells Wet Prep HPF POC: NONE SEEN
Trich, Wet Prep: NONE SEEN
Yeast Wet Prep HPF POC: NONE SEEN

## 2012-08-08 MED ORDER — TERCONAZOLE 0.4 % VA CREA: 1.0000 | TOPICAL_CREAM | Freq: Every day | VAGINAL | Status: DC

## 2012-08-08 NOTE — MAU Provider Note (Signed)
History     CSN: [id number]  Arrival date and time: 08/08/12 1554   First Provider Initiated Contact with Patient 08/08/12 1630      Chief Complaint  Patient presents with  . Rupture of Membranes  . Contractions   HPI Paula Cervantes is a 37 y.o. G1P0 at [redacted]w[redacted]d. She presents with continued contractions and leaking small amt fluid x 1 hr. No bleeding, + FM. Next PNV 7/18.  OB History   Grav Para Term Preterm Abortions TAB SAB Ect Mult Living   1         0      Past Medical History  Diagnosis Date  . Asthma   . Abnormal Pap smear   . Fibroids     Past Surgical History  Procedure Laterality Date  . Ovarian fibroids    . Knee surgery    . No past surgeries      History reviewed. No pertinent family history.  History  Substance Use Topics  . Smoking status: Former Games developer  . Smokeless tobacco: Never Used  . Alcohol Use: No    Allergies:  Allergies  Allergen Reactions  . Penicillins Nausea And Vomiting and Other (See Comments)    Caused patient to pass out.   . Aspirin Hives  . Coconut Flavor Hives  . Sulfa Antibiotics Other (See Comments)    Blisters in mouth and throat    Prescriptions prior to admission  Medication Sig Dispense Refill  . acetaminophen (TYLENOL) 500 MG tablet Take 1,000 mg by mouth every 6 (six) hours as needed. For pain      . DM-Phenylephrine-Acetaminophen (TYLENOL COLD MULTI-SYMPTOM) 10-5-325 MG/15ML LIQD Take 15 mLs by mouth at bedtime as needed (cold symptoms).      . Prenatal Vit-Fe Fumarate-FA (PRENATAL MULTIVITAMIN) TABS Take 1 tablet by mouth daily at 12 noon.      . promethazine (PHENERGAN) 25 MG tablet Take 1 tablet (25 mg total) by mouth every 6 (six) hours as needed for nausea. May take 1/2 tablet for milder symptoms.  May cause sedation.  12 tablet  0  . nitrofurantoin, macrocrystal-monohydrate, (MACROBID) 100 MG capsule Take 1 capsule (100 mg total) by mouth 2 (two) times daily. X 7 days  14 capsule  0    Review of Systems   Constitutional: Negative for fever and chills.  Gastrointestinal: Negative for nausea and vomiting.  Genitourinary: Negative for dysuria, urgency and frequency.       Contractions leaking fluid   Physical Exam   Blood pressure 129/68, pulse 94, temperature 98 F (36.7 C), temperature source Oral, resp. rate 18, height 5\' 7"  (1.702 m), weight 211 lb (95.709 kg).  Physical Exam  Constitutional: She is oriented to person, place, and time. She appears well-developed and well-nourished.  GI: Soft. There is no tenderness.  Gravid  Genitourinary:  SSE Ext gen- nl anatomy,skin intact Vagina- mod amt thick white discharge Cx firm, posterior, closed Uterus- gravid, non-tender Adn- non-tender  Musculoskeletal: Normal range of motion.  Neurological: She is alert and oriented to person, place, and time.  Skin: Skin is warm and dry.  Psychiatric: She has a normal mood and affect. Her behavior is normal.    MAU Course  Procedures  MDM  Fern negative- hyphae on slide  Assessment and Plan  ASSESSMENT:  35 2/7 wks with contractions Not ruptured Vaginal yeast  PLAN:  Labor precautions Terazol 7 cream Keep next appt in office 7/18 Talked with Dr Thornton Papas, [facility] M.  08/08/2012, 4:48 PM

## 2012-08-08 NOTE — MAU Note (Signed)
[name] is here today with complaints of possible rupture of membranes. About 45 mins ago she felt a small gush of fluid, the fluid has not continued and she has not had to wear a pad. She is [redacted]w[redacted]d; complains of irregular contractions 6/10.

## 2012-08-25 ENCOUNTER — Inpatient Hospital Stay (HOSPITAL_COMMUNITY)
Admission: AD | Admit: 2012-08-25 | Discharge: 2012-08-25 | Disposition: A | Discharge status: Home / Self Care | Payer: BC Managed Care – PPO | Source: Ambulatory Visit | Attending: Obstetrics and Gynecology | Admitting: Obstetrics and Gynecology

## 2012-08-25 ENCOUNTER — Encounter (HOSPITAL_COMMUNITY): Payer: Self-pay

## 2012-08-25 DIAGNOSIS — O479 False labor, unspecified: Secondary | ICD-10-CM | POA: Insufficient documentation

## 2012-08-25 HISTORY — DX: Gout, unspecified: M10.9

## 2012-08-25 HISTORY — DX: Endometriosis, unspecified: N80.9

## 2012-08-25 NOTE — Progress Notes (Signed)
Dr. Henderson Cloud notified pt in MAU for labor eval, cervix 0.5, thich, soft, ctx's 1.5-10 minutes apart, membranes intact. Orders to keep pt on monitors x1 hour.

## 2012-08-25 NOTE — Progress Notes (Signed)
Notified cervix unchanged, efm tracing reactive, ctx's irregular, ok to d/c home and will f/u in office tomorrow as previously scheduled.

## 2012-08-25 NOTE — MAU Note (Signed)
Pt states issues with constipation, last BM yesterday, painful. Pt states u/c's q5-6 minutes apart, denies bleeding or lof.

## 2012-08-29 ENCOUNTER — Encounter (HOSPITAL_COMMUNITY): Payer: Self-pay | Admitting: Emergency Medicine

## 2012-08-29 ENCOUNTER — Emergency Department (HOSPITAL_COMMUNITY)
Admission: EM | Admit: 2012-08-29 | Discharge: 2012-08-29 | Disposition: A | Discharge status: Home / Self Care | Payer: BC Managed Care – PPO | Attending: Emergency Medicine | Admitting: Emergency Medicine

## 2012-08-29 DIAGNOSIS — O9989 Other specified diseases and conditions complicating pregnancy, childbirth and the puerperium: Secondary | ICD-10-CM | POA: Insufficient documentation

## 2012-08-29 DIAGNOSIS — Z87891 Personal history of nicotine dependence: Secondary | ICD-10-CM | POA: Insufficient documentation

## 2012-08-29 DIAGNOSIS — Z8543 Personal history of malignant neoplasm of ovary: Secondary | ICD-10-CM | POA: Insufficient documentation

## 2012-08-29 DIAGNOSIS — Z79899 Other long term (current) drug therapy: Secondary | ICD-10-CM | POA: Insufficient documentation

## 2012-08-29 DIAGNOSIS — L509 Urticaria, unspecified: Secondary | ICD-10-CM | POA: Insufficient documentation

## 2012-08-29 DIAGNOSIS — J45909 Unspecified asthma, uncomplicated: Secondary | ICD-10-CM | POA: Insufficient documentation

## 2012-08-29 DIAGNOSIS — Z8639 Personal history of other endocrine, nutritional and metabolic disease: Secondary | ICD-10-CM | POA: Insufficient documentation

## 2012-08-29 DIAGNOSIS — Z862 Personal history of diseases of the blood and blood-forming organs and certain disorders involving the immune mechanism: Secondary | ICD-10-CM | POA: Insufficient documentation

## 2012-08-29 DIAGNOSIS — Z8742 Personal history of other diseases of the female genital tract: Secondary | ICD-10-CM | POA: Insufficient documentation

## 2012-08-29 DIAGNOSIS — Z880 Allergy status to penicillin: Secondary | ICD-10-CM | POA: Insufficient documentation

## 2012-08-29 MED ORDER — HYDROXYZINE HCL 25 MG PO TABS: 25.0000 mg | ORAL_TABLET | Freq: Four times a day (QID) | ORAL | Status: DC | PRN

## 2012-08-29 MED ORDER — LIDOCAINE-PRILOCAINE 2.5-2.5 % EX CREA
TOPICAL_CREAM | Freq: Once | CUTANEOUS | Status: AC
  Administered 2012-08-29: 08:00:00 via TOPICAL
  Filled 2012-08-29: qty 5

## 2012-08-29 MED ORDER — LIDOCAINE HCL 2 % EX GEL: Freq: Once | CUTANEOUS | Status: DC

## 2012-08-29 MED ORDER — HYDROXYZINE HCL 25 MG PO TABS: 50.0000 mg | ORAL_TABLET | Freq: Once | ORAL | Status: DC

## 2012-08-29 MED ORDER — FAMOTIDINE 20 MG PO TABS: 20.0000 mg | ORAL_TABLET | Freq: Once | ORAL | Status: DC

## 2012-08-29 MED ORDER — HYDROXYZINE HCL 25 MG PO TABS
50.0000 mg | ORAL_TABLET | Freq: Once | ORAL | Status: AC
  Administered 2012-08-29: 50 mg via ORAL
  Filled 2012-08-29: qty 2

## 2012-08-29 MED ORDER — LIDOCAINE HCL 2 % EX GEL
Freq: Once | CUTANEOUS | Status: AC
  Administered 2012-08-29: 20 via TOPICAL
  Filled 2012-08-29: qty 20

## 2012-08-29 MED ORDER — FAMOTIDINE 20 MG PO TABS
40.0000 mg | ORAL_TABLET | Freq: Once | ORAL | Status: AC
  Administered 2012-08-29: 40 mg via ORAL
  Filled 2012-08-29: qty 2

## 2012-08-29 NOTE — ED Notes (Signed)
Patient reports that she noticed red areas and hives yesterday around 11am she hasn't used any new detergent or any food allergies except coconuts but didn't eat out anywhere yesterday.  She did report finding a tick crawling her at 4am Saturday am.  There are obvious hives noted over body

## 2012-08-29 NOTE — ED Provider Notes (Signed)
CSN: [id number]     Arrival date & time 08/29/12  0503 History     First MD Initiated Contact with Patient 08/29/12 0542     Chief Complaint  Patient presents with  . Urticaria   (Consider location/radiation/quality/duration/timing/severity/associated sxs/prior Treatment) Patient is a 37 y.o. female presenting with urticaria.  Urticaria   patient presents at [redacted] weeks pregnant, presents with urticarial lesions at present at approximately 11:00 yesterday, denies any food allergies, any contact with new soaps, skin products.  She has had a tick crawling on her, this was not embedded, she found no other tics, she denies any other rash, TG lesions, headache, fevers or chills.  The rash is itchy, severe, she's taken Benadryl, 25 mg with little relief.  Past Medical History  Diagnosis Date  . Asthma   . Abnormal Pap smear   . Fibroids   . Ovarian cancer   . Endometriosis   . Gout    Past Surgical History  Procedure Laterality Date  . Ovarian fibroids    . Knee surgery    . No past surgeries    . Cryotherapy     No family history on file. History  Substance Use Topics  . Smoking status: Former Games developer  . Smokeless tobacco: Never Used  . Alcohol Use: No   OB History   Grav Para Term Preterm Abortions TAB SAB Ect Mult Living   1         0     Review of Systems At least 10pt or greater review of systems completed and are negative except where specified in the HPI.  Allergies  Penicillins; Sulfa antibiotics; Aspirin; and Coconut flavor  Home Medications   Current Outpatient Rx  Name  Route  Sig  Dispense  Refill  . acetaminophen (TYLENOL) 500 MG tablet   Oral   Take 1,000 mg by mouth every 6 (six) hours as needed. For pain         . folic acid (FOLVITE) 1 MG tablet   Oral   Take 1 mg by mouth daily.         . Prenatal Vit-Fe Fumarate-FA (PRENATAL MULTIVITAMIN) TABS   Oral   Take 1 tablet by mouth daily at 12 noon.         . valACYclovir (VALTREX) 500 MG  tablet   Oral   Take 500 mg by mouth daily.          BP 121/69  Pulse 101  Temp(Src) 98 F (36.7 C) (Oral)  Resp 19  SpO2 100% Physical Exam  Skin:       Nursing notes reviewed.  Electronic medical record reviewed. VITAL SIGNS:   Filed Vitals:   08/29/12 0516 08/29/12 0700 08/29/12 0730  BP: 123/79 121/69 106/70  Pulse: 113 101 107  Temp: 98 F (36.7 C)    TempSrc: Oral    Resp: 19    SpO2: 98% 100% 98%   CONSTITUTIONAL: Awake, oriented, appears non-toxic HENT: Atraumatic, normocephalic, oral mucosa pink and moist, airway patent. Nares patent without drainage. External ears normal. EYES: Conjunctiva clear, EOMI, PERRLA NECK: Trachea midline, non-tender, supple CARDIOVASCULAR: Normal heart rate, Normal rhythm, No murmurs, rubs, gallops PULMONARY/CHEST: Clear to auscultation, no rhonchi, wheezes, or rales. Symmetrical breath sounds. Non-tender. ABDOMINAL: Gravid, soft, non-tender - no rebound or guarding.  BS normal. NEUROLOGIC: Non-focal, moving all four extremities, no gross sensory or motor deficits. EXTREMITIES: No clubbing, cyanosis, or edema SKIN: Warm, Dry, No erythema, urticarial lesions present as depicted  ED Course   Procedures (including critical care time)  Labs Reviewed - No data to display No results found. 1. Urticaria     MDM  Vision has been in the emergency department 2 other times for urticarial rash, this could be pruritic urticarial rash of pregnancy, and given the patient is to roll and covered the more itchy lesions with topical lidocaine with good results. Do not think she's had a significant tick exposure, certainly there are no petechial lesions on the lower extremities, upper extremities or abdomen, she's had no headache, fevers or chills.  I do not think she would require treatment as a tick she found was not embedded.  Patient to followup with her obstetrician as needed. Return to the ER for any new or concerning symptoms. Discussed  the medical plan with the patient, she understands and accepts the medical plan as it's been dictated and I have answered all of her questions to her satisfaction. She is cautioned not to use the lidocaine frequently, 3 times a day to the most itchy lesions only.  Jones Skene, MD 08/30/12 [phone]

## 2012-09-04 ENCOUNTER — Inpatient Hospital Stay (HOSPITAL_COMMUNITY)
Admission: AD | Admit: 2012-09-04 | Discharge: 2012-09-04 | Disposition: A | Discharge status: Home / Self Care | Payer: BC Managed Care – PPO | Source: Ambulatory Visit | Attending: Obstetrics & Gynecology | Admitting: Obstetrics & Gynecology

## 2012-09-04 ENCOUNTER — Encounter (HOSPITAL_COMMUNITY): Payer: Self-pay | Admitting: *Deleted

## 2012-09-04 DIAGNOSIS — O479 False labor, unspecified: Secondary | ICD-10-CM | POA: Insufficient documentation

## 2012-09-04 NOTE — MAU Note (Signed)
Complaint of contractions

## 2012-09-05 ENCOUNTER — Inpatient Hospital Stay (HOSPITAL_COMMUNITY)
Admission: AD | Admit: 2012-09-05 | Discharge: 2012-09-05 | Disposition: A | Discharge status: Home / Self Care | Payer: BC Managed Care – PPO | Source: Ambulatory Visit | Attending: Obstetrics & Gynecology | Admitting: Obstetrics & Gynecology

## 2012-09-05 ENCOUNTER — Encounter (HOSPITAL_COMMUNITY): Payer: Self-pay | Admitting: *Deleted

## 2012-09-05 DIAGNOSIS — O479 False labor, unspecified: Secondary | ICD-10-CM

## 2012-09-05 DIAGNOSIS — O99891 Other specified diseases and conditions complicating pregnancy: Secondary | ICD-10-CM | POA: Insufficient documentation

## 2012-09-05 NOTE — MAU Provider Note (Signed)
S:  Called to do a rule out rupture of membranes       This is a 37 y.o. female at [redacted]w[redacted]d who presented for possible leaking of fluid.  O:   Filed Vitals:   09/05/12 1723 09/05/12 1736 09/05/12 1815  BP:  107/60 115/65  Pulse:  119 110  Temp:  99.6 F (37.6 C) 98.6 F (37 C)  TempSrc:  Oral Oral  Resp:  18 18  Height: 5' 5.5" (1.664 m)    Weight: 97.614 kg (215 lb 3.2 oz)     Speculum exam:  No pooling,  No ferning, scant pink discharge Dilation: Closed Effacement (%): Thick Cervical Position: Posterior Station: -2 Presentation: Vertex Exam by:: m. Edilson Vital, cnm  FHR reactive, Category I Irregular mild contractions  Dispo:  RN will contact MD

## 2012-09-05 NOTE — MAU Note (Signed)
Pt presents with leakage of fluid at around 1430 this pm.  Noticed clear and pinkish discharge.  + fm per pt.  Pt denies pih symptoms,  Slight bleeding per pt with contractions q 5-7 minutes

## 2012-09-05 NOTE — MAU Note (Signed)
Patient states she has been having contractions every 5 minutes. States that about 1400 she saw a wet spot on the sofa and wet panties when standing up. States she went for a walk and her mini pad a a yellowish fluid with some blood on it, Reports good fetal movement.

## 2012-09-14 ENCOUNTER — Other Ambulatory Visit (HOSPITAL_COMMUNITY): Payer: Self-pay | Admitting: Obstetrics and Gynecology

## 2012-09-14 NOTE — H&P (Signed)
[facility]  DICTATION # [id number] CSN# [id number]   Meriel Pica, MD 09/14/2012 3:16 PM

## 2012-09-15 ENCOUNTER — Encounter (HOSPITAL_COMMUNITY): Payer: Self-pay | Admitting: Pharmacy Technician

## 2012-09-15 NOTE — H&P (Signed)
NAME:  JERA, HEADINGS NO.:  [id number]  MEDICAL RECORD NO.:  [id number]  LOCATION:                                 FACILITY:  PHYSICIAN:  Duke Salvia. Marcelle Overlie, M.D.    DATE OF BIRTH:  DATE OF ADMISSION:  09/20/2012 DATE OF DISCHARGE:                             HISTORY & PHYSICAL   CHIEF COMPLAINT:  Term pregnancy, recent HSV.  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  Although, she has been on daily Valtrex suppression as recent outbreak of HSV presents now for primary cesarean section.  Maternity 21 was normal along with her GBS being negative.  One-hour GTT normal at 133, AFP only negative.  The patient had a prior laparoscopy when she was in her 85s, review of that note did not document laparoscopic myomectomy like she was told.  The procedure of primary cesarean section including specific risks related to bleeding, infection, transfusion, wound infection, adjacent organ injury, all reviewed with her, which she understands and accepts.  PAST MEDICAL HISTORY:  ALLERGIES:  PENICILLIN, SULFA, ASPIRIN.  REVIEW OF SYSTEMS:  For the remainder of the review of systems, please find the information in the [place] form.  PHYSICAL EXAMINATION:  VITAL SIGNS:  Temperature 98.2, blood pressure 126/68. HEENT:  Unremarkable. NECK:  Supple without masses. LUNGS:  Clear. CARDIOVASCULAR:  Regular rate and rhythm without murmurs, rubs, or gallops. BREASTS:  Without masses, term fundal height.  Fetal heart rate 140. Cervix was closed.  Area lateral to the left labia were healing recent HSV outbreak. EXTREMITIES:  Unremarkable. NEUROLOGIC:  Unremarkable.  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  PLAN:  Primary cesarean section.  Procedure and risks were discussed as above.     Riccardo Holeman M. Marcelle Overlie,  M.D.     RMH/MEDQ  D:  09/14/2012  T:  09/15/2012  Job:  [id number]

## 2012-09-16 ENCOUNTER — Encounter (HOSPITAL_COMMUNITY): Payer: Self-pay

## 2012-09-17 ENCOUNTER — Encounter (HOSPITAL_COMMUNITY): Payer: Self-pay

## 2012-09-17 ENCOUNTER — Encounter (HOSPITAL_COMMUNITY)
Admission: RE | Admit: 2012-09-17 | Discharge: 2012-09-17 | Disposition: A | Discharge status: Home / Self Care | Payer: BC Managed Care – PPO | Source: Ambulatory Visit | Attending: Obstetrics and Gynecology | Admitting: Obstetrics and Gynecology

## 2012-09-17 ENCOUNTER — Inpatient Hospital Stay (HOSPITAL_COMMUNITY): Admission: RE | Admit: 2012-09-17 | Payer: BC Managed Care – PPO | Source: Ambulatory Visit

## 2012-09-17 DIAGNOSIS — Z01818 Encounter for other preprocedural examination: Secondary | ICD-10-CM | POA: Insufficient documentation

## 2012-09-17 DIAGNOSIS — Z01812 Encounter for preprocedural laboratory examination: Secondary | ICD-10-CM | POA: Insufficient documentation

## 2012-09-17 LAB — CBC
MCV: 90.3 fL (ref 78.0–100.0)
Platelets: 279 10*3/uL (ref 150–400)
RDW: 13.6 % (ref 11.5–15.5)
WBC: 7.3 10*3/uL (ref 4.0–10.5)

## 2012-09-17 LAB — TYPE AND SCREEN: ABO/RH(D): A POS

## 2012-09-17 LAB — RPR: RPR Ser Ql: NONREACTIVE

## 2012-09-17 NOTE — Patient Instructions (Addendum)
[address] Gaila [facility]  09/17/2012   Your procedure is scheduled on:  09/20/12  Enter through the Main Entrance of [facility] at 12 Noon  Pick up the phone at the desk and dial [phone].   Call this number if you have problems the morning of surgery: [phone]   Remember:   Do not eat food:After Midnight.  Do not drink clear liquids: 4 Hours before arrival.  Take these medicines the morning of surgery with A SIP OF WATER: NA   Do not wear jewelry, make-up or nail polish.  Do not wear lotions, powders, or perfumes. You may wear deodorant.  Do not shave 48 hours prior to surgery.  Do not bring valuables to the hospital.  [facility] is not responsible                  for any belongings or valuables brought to the hospital.  Contacts, dentures or bridgework may not be worn into surgery.  Leave suitcase in the car. After surgery it may be brought to your room.  For patients admitted to the hospital, checkout time is 11:00 AM the day of                discharge.   Patients discharged the day of surgery will not be allowed to drive                   home.  Name and phone number of your driver: NA  Special Instructions: Shower using CHG 2 nights before surgery and the night before surgery.  If you shower the day of surgery use CHG.  Use special wash - you have one bottle of CHG for all showers.  You should use approximately 1/3 of the bottle for each shower.   Please read over the following fact sheets that you were given: Surgical Site Infection Prevention

## 2012-09-20 ENCOUNTER — Encounter (HOSPITAL_COMMUNITY)
Admission: RE | Disposition: A | Discharge status: Home / Self Care | Payer: Self-pay | Source: Ambulatory Visit | Attending: Obstetrics and Gynecology

## 2012-09-20 ENCOUNTER — Encounter (HOSPITAL_COMMUNITY): Payer: Self-pay | Admitting: *Deleted

## 2012-09-20 ENCOUNTER — Encounter (HOSPITAL_COMMUNITY): Payer: Self-pay | Admitting: Anesthesiology

## 2012-09-20 ENCOUNTER — Inpatient Hospital Stay (HOSPITAL_COMMUNITY)
Admission: RE | Admit: 2012-09-20 | Discharge: 2012-09-23 | DRG: 371 | Disposition: A | Discharge status: Home / Self Care | Payer: BC Managed Care – PPO | Source: Ambulatory Visit | Attending: Obstetrics and Gynecology | Admitting: Obstetrics and Gynecology

## 2012-09-20 ENCOUNTER — Ambulatory Visit (HOSPITAL_COMMUNITY): Payer: BC Managed Care – PPO | Admitting: Anesthesiology

## 2012-09-20 DIAGNOSIS — O98519 Other viral diseases complicating pregnancy, unspecified trimester: Principal | ICD-10-CM | POA: Diagnosis present

## 2012-09-20 DIAGNOSIS — A6000 Herpesviral infection of urogenital system, unspecified: Secondary | ICD-10-CM | POA: Diagnosis present

## 2012-09-20 SURGERY — Surgical Case
Anesthesia: Spinal | Site: Abdomen | Wound class: Clean Contaminated

## 2012-09-20 MED ORDER — DIPHENHYDRAMINE HCL 25 MG PO CAPS
25.0000 mg | ORAL_CAPSULE | ORAL | Status: DC | PRN
  Filled 2012-09-20: qty 1

## 2012-09-20 MED ORDER — FLEET ENEMA 7-19 GM/118ML RE ENEM: 1.0000 | ENEMA | Freq: Every day | RECTAL | Status: DC | PRN

## 2012-09-20 MED ORDER — KETOROLAC TROMETHAMINE 30 MG/ML IJ SOLN
INTRAMUSCULAR | Status: AC
  Filled 2012-09-20: qty 1

## 2012-09-20 MED ORDER — MORPHINE SULFATE 0.5 MG/ML IJ SOLN
INTRAMUSCULAR | Status: AC
  Filled 2012-09-20: qty 10

## 2012-09-20 MED ORDER — OXYTOCIN 10 UNIT/ML IJ SOLN
40.0000 [IU] | INTRAVENOUS | Status: DC | PRN
  Administered 2012-09-20: 40 [IU] via INTRAVENOUS

## 2012-09-20 MED ORDER — SENNOSIDES-DOCUSATE SODIUM 8.6-50 MG PO TABS
2.0000 | ORAL_TABLET | Freq: Every day | ORAL | Status: DC
  Administered 2012-09-20 – 2012-09-22 (×3): 2 via ORAL

## 2012-09-20 MED ORDER — ONDANSETRON HCL 4 MG/2ML IJ SOLN: 4.0000 mg | INTRAMUSCULAR | Status: DC | PRN

## 2012-09-20 MED ORDER — NALBUPHINE SYRINGE 5 MG/0.5 ML
INJECTION | INTRAMUSCULAR | Status: AC
  Administered 2012-09-20: 10 mg via SUBCUTANEOUS
  Filled 2012-09-20: qty 1

## 2012-09-20 MED ORDER — SCOPOLAMINE 1 MG/3DAYS TD PT72: 1.0000 | MEDICATED_PATCH | Freq: Once | TRANSDERMAL | Status: DC

## 2012-09-20 MED ORDER — LANOLIN HYDROUS EX OINT: 1.0000 "application " | TOPICAL_OINTMENT | CUTANEOUS | Status: DC | PRN

## 2012-09-20 MED ORDER — DIPHENHYDRAMINE HCL 50 MG/ML IJ SOLN
12.5000 mg | INTRAMUSCULAR | Status: DC | PRN
  Administered 2012-09-20: 12.5 mg via INTRAVENOUS
  Filled 2012-09-20: qty 1

## 2012-09-20 MED ORDER — ONDANSETRON HCL 4 MG/2ML IJ SOLN
INTRAMUSCULAR | Status: AC
  Filled 2012-09-20: qty 2

## 2012-09-20 MED ORDER — DIPHENHYDRAMINE HCL 25 MG PO CAPS
25.0000 mg | ORAL_CAPSULE | Freq: Four times a day (QID) | ORAL | Status: DC | PRN
  Administered 2012-09-20: 25 mg via ORAL
  Filled 2012-09-20: qty 1

## 2012-09-20 MED ORDER — LACTATED RINGERS IV SOLN
INTRAVENOUS | Status: DC | PRN
  Administered 2012-09-20 (×3): via INTRAVENOUS

## 2012-09-20 MED ORDER — ONDANSETRON HCL 4 MG PO TABS: 4.0000 mg | ORAL_TABLET | ORAL | Status: DC | PRN

## 2012-09-20 MED ORDER — NALOXONE HCL 1 MG/ML IJ SOLN
1.0000 ug/kg/h | INTRAMUSCULAR | Status: DC | PRN
  Filled 2012-09-20: qty 2

## 2012-09-20 MED ORDER — MORPHINE SULFATE (PF) 0.5 MG/ML IJ SOLN
INTRAMUSCULAR | Status: DC | PRN
  Administered 2012-09-20: .15 mg via INTRATHECAL

## 2012-09-20 MED ORDER — OXYTOCIN 10 UNIT/ML IJ SOLN
INTRAMUSCULAR | Status: AC
  Filled 2012-09-20: qty 2

## 2012-09-20 MED ORDER — PHENYLEPHRINE 40 MCG/ML (10ML) SYRINGE FOR IV PUSH (FOR BLOOD PRESSURE SUPPORT)
PREFILLED_SYRINGE | INTRAVENOUS | Status: AC
  Filled 2012-09-20: qty 25

## 2012-09-20 MED ORDER — ZOLPIDEM TARTRATE 5 MG PO TABS: 5.0000 mg | ORAL_TABLET | Freq: Every evening | ORAL | Status: DC | PRN

## 2012-09-20 MED ORDER — NALOXONE HCL 0.4 MG/ML IJ SOLN: 0.4000 mg | INTRAMUSCULAR | Status: DC | PRN

## 2012-09-20 MED ORDER — OXYTOCIN 40 UNITS IN LACTATED RINGERS INFUSION - SIMPLE MED: 62.5000 mL/h | INTRAVENOUS | Status: AC

## 2012-09-20 MED ORDER — 0.9 % SODIUM CHLORIDE (POUR BTL) OPTIME
TOPICAL | Status: DC | PRN
  Administered 2012-09-20: 1000 mL

## 2012-09-20 MED ORDER — LACTATED RINGERS IV SOLN
Freq: Once | INTRAVENOUS | Status: AC
  Administered 2012-09-20: 12:00:00 via INTRAVENOUS

## 2012-09-20 MED ORDER — WITCH HAZEL-GLYCERIN EX PADS: 1.0000 "application " | MEDICATED_PAD | CUTANEOUS | Status: DC | PRN

## 2012-09-20 MED ORDER — MEPERIDINE HCL 25 MG/ML IJ SOLN: 6.2500 mg | INTRAMUSCULAR | Status: DC | PRN

## 2012-09-20 MED ORDER — LACTATED RINGERS IV SOLN
INTRAVENOUS | Status: DC
  Administered 2012-09-20: 22:00:00 via INTRAVENOUS

## 2012-09-20 MED ORDER — ONDANSETRON HCL 4 MG/2ML IJ SOLN
INTRAMUSCULAR | Status: DC | PRN
  Administered 2012-09-20: 4 mg via INTRAVENOUS

## 2012-09-20 MED ORDER — FENTANYL CITRATE 0.05 MG/ML IJ SOLN: 25.0000 ug | INTRAMUSCULAR | Status: DC | PRN

## 2012-09-20 MED ORDER — METOCLOPRAMIDE HCL 5 MG/ML IJ SOLN: 10.0000 mg | Freq: Three times a day (TID) | INTRAMUSCULAR | Status: DC | PRN

## 2012-09-20 MED ORDER — EPHEDRINE SULFATE 50 MG/ML IJ SOLN
INTRAMUSCULAR | Status: DC | PRN
  Administered 2012-09-20: 10 mg via INTRAVENOUS

## 2012-09-20 MED ORDER — FENTANYL CITRATE 0.05 MG/ML IJ SOLN
INTRAMUSCULAR | Status: DC | PRN
  Administered 2012-09-20: 25 ug via INTRATHECAL

## 2012-09-20 MED ORDER — NALBUPHINE HCL 10 MG/ML IJ SOLN
5.0000 mg | INTRAMUSCULAR | Status: DC | PRN
  Filled 2012-09-20: qty 1

## 2012-09-20 MED ORDER — MENTHOL 3 MG MT LOZG: 1.0000 | LOZENGE | OROMUCOSAL | Status: DC | PRN

## 2012-09-20 MED ORDER — SODIUM CHLORIDE 0.9 % IJ SOLN: 3.0000 mL | INTRAMUSCULAR | Status: DC | PRN

## 2012-09-20 MED ORDER — GENTAMICIN SULFATE 40 MG/ML IJ SOLN
INTRAVENOUS | Status: AC
  Administered 2012-09-20: 100 mL via INTRAVENOUS
  Filled 2012-09-20: qty 9

## 2012-09-20 MED ORDER — SODIUM CHLORIDE 0.9 % IV SOLN: 250.0000 mL | INTRAVENOUS | Status: DC

## 2012-09-20 MED ORDER — DIPHENHYDRAMINE HCL 50 MG/ML IJ SOLN: 25.0000 mg | INTRAMUSCULAR | Status: DC | PRN

## 2012-09-20 MED ORDER — OXYCODONE-ACETAMINOPHEN 5-325 MG PO TABS
1.0000 | ORAL_TABLET | Freq: Four times a day (QID) | ORAL | Status: DC | PRN
  Administered 2012-09-21 – 2012-09-22 (×4): 2 via ORAL
  Filled 2012-09-20 (×5): qty 2

## 2012-09-20 MED ORDER — LACTATED RINGERS IV SOLN
INTRAVENOUS | Status: DC | PRN
  Administered 2012-09-20: 14:00:00 via INTRAVENOUS

## 2012-09-20 MED ORDER — BUPIVACAINE IN DEXTROSE 0.75-8.25 % IT SOLN
INTRATHECAL | Status: DC | PRN
  Administered 2012-09-20: 1.6 mL via INTRATHECAL

## 2012-09-20 MED ORDER — PROMETHAZINE HCL 25 MG/ML IJ SOLN: 6.2500 mg | INTRAMUSCULAR | Status: DC | PRN

## 2012-09-20 MED ORDER — KETOROLAC TROMETHAMINE 30 MG/ML IJ SOLN
30.0000 mg | Freq: Four times a day (QID) | INTRAMUSCULAR | Status: AC | PRN
  Administered 2012-09-20: 30 mg via INTRAMUSCULAR

## 2012-09-20 MED ORDER — MEASLES, MUMPS & RUBELLA VAC ~~LOC~~ INJ: 0.5000 mL | INJECTION | Freq: Once | SUBCUTANEOUS | Status: DC

## 2012-09-20 MED ORDER — SIMETHICONE 80 MG PO CHEW
80.0000 mg | CHEWABLE_TABLET | Freq: Three times a day (TID) | ORAL | Status: DC
  Administered 2012-09-20 – 2012-09-23 (×9): 80 mg via ORAL

## 2012-09-20 MED ORDER — SCOPOLAMINE 1 MG/3DAYS TD PT72
MEDICATED_PATCH | TRANSDERMAL | Status: AC
  Administered 2012-09-20: 1.5 mg via TRANSDERMAL
  Filled 2012-09-20: qty 1

## 2012-09-20 MED ORDER — SIMETHICONE 80 MG PO CHEW: 80.0000 mg | CHEWABLE_TABLET | ORAL | Status: DC | PRN

## 2012-09-20 MED ORDER — PRENATAL MULTIVITAMIN CH
1.0000 | ORAL_TABLET | Freq: Every day | ORAL | Status: DC
  Administered 2012-09-21 – 2012-09-22 (×2): 1 via ORAL
  Filled 2012-09-20 (×2): qty 1

## 2012-09-20 MED ORDER — IBUPROFEN 800 MG PO TABS
800.0000 mg | ORAL_TABLET | Freq: Three times a day (TID) | ORAL | Status: DC | PRN
  Administered 2012-09-20 – 2012-09-22 (×5): 800 mg via ORAL
  Filled 2012-09-20 (×6): qty 1

## 2012-09-20 MED ORDER — DIBUCAINE 1 % RE OINT: 1.0000 "application " | TOPICAL_OINTMENT | RECTAL | Status: DC | PRN

## 2012-09-20 MED ORDER — TETANUS-DIPHTH-ACELL PERTUSSIS 5-2.5-18.5 LF-MCG/0.5 IM SUSP: 0.5000 mL | Freq: Once | INTRAMUSCULAR | Status: DC

## 2012-09-20 MED ORDER — ONDANSETRON HCL 4 MG/2ML IJ SOLN: 4.0000 mg | Freq: Three times a day (TID) | INTRAMUSCULAR | Status: DC | PRN

## 2012-09-20 MED ORDER — PHENYLEPHRINE HCL 10 MG/ML IJ SOLN
INTRAMUSCULAR | Status: DC | PRN
  Administered 2012-09-20: 40 ug via INTRAVENOUS
  Administered 2012-09-20: 80 ug via INTRAVENOUS
  Administered 2012-09-20 (×3): 40 ug via INTRAVENOUS
  Administered 2012-09-20 (×2): 80 ug via INTRAVENOUS
  Administered 2012-09-20: 40 ug via INTRAVENOUS
  Administered 2012-09-20: 80 ug via INTRAVENOUS
  Administered 2012-09-20: 40 ug via INTRAVENOUS
  Administered 2012-09-20 (×4): 80 ug via INTRAVENOUS

## 2012-09-20 MED ORDER — KETOROLAC TROMETHAMINE 30 MG/ML IJ SOLN: 30.0000 mg | Freq: Four times a day (QID) | INTRAMUSCULAR | Status: AC | PRN

## 2012-09-20 MED ORDER — SODIUM CHLORIDE 0.9 % IJ SOLN: 3.0000 mL | Freq: Two times a day (BID) | INTRAMUSCULAR | Status: DC

## 2012-09-20 MED ORDER — FENTANYL CITRATE 0.05 MG/ML IJ SOLN
INTRAMUSCULAR | Status: AC
  Filled 2012-09-20: qty 2

## 2012-09-20 MED ORDER — MIDAZOLAM HCL 2 MG/2ML IJ SOLN: 0.5000 mg | Freq: Once | INTRAMUSCULAR | Status: DC | PRN

## 2012-09-20 MED ORDER — BISACODYL 10 MG RE SUPP: 10.0000 mg | Freq: Every day | RECTAL | Status: DC | PRN

## 2012-09-20 SURGICAL SUPPLY — 25 items
CLAMP CORD UMBIL (MISCELLANEOUS) ×1 IMPLANT
CLOTH BEACON ORANGE TIMEOUT ST (SAFETY) ×2 IMPLANT
DRAPE LG THREE QUARTER DISP (DRAPES) ×2 IMPLANT
DRSG OPSITE POSTOP 4X10 (GAUZE/BANDAGES/DRESSINGS) ×2 IMPLANT
DURAPREP 26ML APPLICATOR (WOUND CARE) ×2 IMPLANT
ELECT REM PT RETURN 9FT ADLT (ELECTROSURGICAL) ×2
ELECTRODE REM PT RTRN 9FT ADLT (ELECTROSURGICAL) ×1 IMPLANT
EXTRACTOR VACUUM M CUP 4 TUBE (SUCTIONS) IMPLANT
GLOVE BIO SURGEON STRL SZ7 (GLOVE) ×2 IMPLANT
GOWN STRL REIN XL XLG (GOWN DISPOSABLE) ×4 IMPLANT
KIT ABG SYR 3ML LUER SLIP (SYRINGE) ×1 IMPLANT
NDL HYPO 25X5/8 SAFETYGLIDE (NEEDLE) ×1 IMPLANT
NEEDLE HYPO 25X5/8 SAFETYGLIDE (NEEDLE) ×2 IMPLANT
NS IRRIG 1000ML POUR BTL (IV SOLUTION) ×2 IMPLANT
PACK C SECTION WH (CUSTOM PROCEDURE TRAY) ×2 IMPLANT
PAD OB MATERNITY 4.3X12.25 (PERSONAL CARE ITEMS) ×2 IMPLANT
STRIP CLOSURE SKIN 1/2X4 (GAUZE/BANDAGES/DRESSINGS) ×2 IMPLANT
SUT CHROMIC 0 CTX 36 (SUTURE) ×6 IMPLANT
SUT MON AB 4-0 PS1 27 (SUTURE) ×2 IMPLANT
SUT PDS AB 0 CT1 27 (SUTURE) ×4 IMPLANT
SUT VIC AB 3-0 CT1 27 (SUTURE) ×4
SUT VIC AB 3-0 CT1 TAPERPNT 27 (SUTURE) ×2 IMPLANT
TOWEL OR 17X24 6PK STRL BLUE (TOWEL DISPOSABLE) ×4 IMPLANT
TRAY FOLEY CATH 14FR (SET/KITS/TRAYS/PACK) ×2 IMPLANT
WATER STERILE IRR 1000ML POUR (IV SOLUTION) ×1 IMPLANT

## 2012-09-20 NOTE — Progress Notes (Signed)
The patient was re-examined with no change in status 

## 2012-09-20 NOTE — Op Note (Signed)
Preoperative diagnosis: Term pregnancy, history recent HSV outbreak  Postoperative diagnosis: Same  Procedure: Primary low transverse cesarean section  Surgeon: Marcelle Overlie  Anesthesia: Spinal  EBL: 700 cc  Drains: Foley  Procedure and findings:  Patient taken the operating room after an adequate level of spinal anesthetic was obtained with the patient in left tilt position the abdomen prepped and draped in the usual fashion for cesarean section, Foley catheter positioned draining clear urine. Appropriate timeout taken at that point. After prepping and draping transverse incision was made in the skin carried down to the fascia was incised and extended transversely. Rectus muscle divided in the midline, peritoneum entered superiorly without incident and extended in a vertical fashion. The vesicouterine serosa was incised and the bladder was bluntly and sharply dissected below, bladder blade repositioned. Transverse incision made lower segment extended with bandage scissors clear fluid noted. The patient delivered of a healthy female, the infant was suctioned cord clamped and passed the pediatric team for further care with good Apgars. The placenta was then delivered manually intact, uterus exteriorized cavity wiped clean with a laparotomy pack closure obtained the first layer of 0 chromic in a locked fashion followed by an imbricating layer of 0 chromic. This is hemostatic the bladder flap area was intact and hemostatic. Bilateral tubes and ovaries were normal. Prior to closure sponge denies precast reported as correct x2 peritoneum was enclose a running 3-0 Vicryl suture. 3-0 Vicryl interrupted sutures were then used to reapproximate the rectus muscles in the midline 0 PDS from laterally to midline on either side to close the fascia subcutaneous tissue was irrigated noted be hemostatic and fairly minimal was not closed separately 4-0 Monocryl subcuticular closure with sterile dressing she tolerated this  well went to recovery room in good condition.  Dictated with [facility]  Paula Cervantes M. Paula Cervantes.D.

## 2012-09-20 NOTE — Anesthesia Postprocedure Evaluation (Signed)
  Anesthesia Post Note  Patient: Forensic scientist  Procedure(s) Performed: Procedure(s) (LRB): PRIMARY CESAREAN SECTION (N/A)  Anesthesia type: Spinal  Patient location: PACU  Post pain: Pain level controlled  Post assessment: Post-op Vital signs reviewed  Last Vitals:  Filed Vitals:   09/20/12 1430  BP: 114/62  Pulse: 90  Temp:   Resp: 17    Post vital signs: Reviewed  Level of consciousness: awake  Complications: No apparent anesthesia complications

## 2012-09-20 NOTE — Anesthesia Procedure Notes (Signed)
Spinal  Patient location during procedure: OR Start time: 09/20/2012 1:03 PM Staffing Anesthesiologist: Angus Seller., Harrell Gave. Performed by: anesthesiologist  Preanesthetic Checklist Completed: patient identified, site marked, surgical consent, pre-op evaluation, timeout performed, IV checked, risks and benefits discussed and monitors and equipment checked Spinal Block Patient position: sitting Prep: DuraPrep Patient monitoring: heart rate, cardiac monitor, continuous pulse ox and blood pressure Approach: midline Location: L3-4 Injection technique: single-shot Needle Needle type: Sprotte  Needle gauge: 24 G Needle length: 9 cm Assessment Sensory level: T4 Additional Notes Patient identified.  Risk benefits discussed including failed block, incomplete pain control, headache, nerve damage, paralysis, blood pressure changes, nausea, vomiting, reactions to medication both toxic or allergic, and postpartum back pain.  Patient expressed understanding and wished to proceed.  All questions were answered.  Sterile technique used throughout procedure.  CSF was clear.  No parasthesia or other complications.  Please see nursing notes for vital signs.

## 2012-09-20 NOTE — Anesthesia Preprocedure Evaluation (Addendum)
Anesthesia Evaluation  Patient identified by MRN, date of birth, ID band Patient awake    Reviewed: Allergy & Precautions, H&P , Patient's Chart, lab work & pertinent test results  Airway Mallampati: II TM Distance: >3 FB Neck ROM: full    Dental no notable dental hx.    Pulmonary neg pulmonary ROS, asthma ,  breath sounds clear to auscultation  Pulmonary exam normal       Cardiovascular negative cardio ROS  Rhythm:regular Rate:Normal     Neuro/Psych negative neurological ROS  negative psych ROS   GI/Hepatic negative GI ROS, Neg liver ROS,   Endo/Other  negative endocrine ROS  Renal/GU negative Renal ROS     Musculoskeletal   Abdominal   Peds  Hematology negative hematology ROS (+)   Anesthesia Other Findings   Reproductive/Obstetrics (+) Pregnancy                           Anesthesia Physical Anesthesia Plan  ASA: II  Anesthesia Plan: Spinal   Post-op Pain Management:    Induction:   Airway Management Planned:   Additional Equipment:   Intra-op Plan:   Post-operative Plan:   Informed Consent: I have reviewed the patients History and Physical, chart, labs and discussed the procedure including the risks, benefits and alternatives for the proposed anesthesia with the patient or authorized representative who has indicated his/her understanding and acceptance.     Plan Discussed with:   Anesthesia Plan Comments:         Anesthesia Quick Evaluation

## 2012-09-20 NOTE — Transfer of Care (Signed)
Immediate Anesthesia Transfer of Care Note  Patient: Forensic scientist  Procedure(s) Performed: Procedure(s) with comments: PRIMARY CESAREAN SECTION (N/A) - Primary edc 8/25  Patient Location: PACU  Anesthesia Type:Spinal  Level of Consciousness: awake, alert  and oriented  Airway & Oxygen Therapy: Patient Spontanous Breathing  Post-op Assessment: Report given to PACU RN and Post -op Vital signs reviewed and stable  Post vital signs: stable  Complications: No apparent anesthesia complications

## 2012-09-21 ENCOUNTER — Encounter (HOSPITAL_COMMUNITY): Payer: Self-pay | Admitting: Obstetrics and Gynecology

## 2012-09-21 LAB — CBC
Hemoglobin: 10.7 g/dL — ABNORMAL LOW (ref 12.0–15.0)
MCHC: 34 g/dL (ref 30.0–36.0)
RBC: 3.48 MIL/uL — ABNORMAL LOW (ref 3.87–5.11)
WBC: 8.7 10*3/uL (ref 4.0–10.5)

## 2012-09-21 NOTE — Progress Notes (Signed)
Subjective: Postpartum Day 1: Cesarean Delivery Patient reports incisional pain, tolerating PO and no problems voiding.    Objective: Vital signs in last 24 hours: Temp:  [97.4 F (36.3 C)-98.4 F (36.9 C)] 98.2 F (36.8 C) (08/19 0400) Pulse Rate:  [67-95] 67 (08/19 0400) Resp:  [17-26] 20 (08/19 0400) BP: (91-139)/(34-78) 130/67 mmHg (08/19 0400) SpO2:  [94 %-100 %] 99 % (08/19 0400) Weight:  [210 lb (95.255 kg)] 210 lb (95.255 kg) (08/18 1445)  Physical Exam:  General: alert and cooperative Lochia: appropriate Uterine Fundus: firm Incision: honeycomb dressing noted with small drainage noted on bandage DVT Evaluation: No evidence of DVT seen on physical exam. Negative Homan's sign. No cords or calf tenderness. No significant calf/ankle edema.   Recent Labs  09/21/12 0605  HGB 10.7*  HCT 31.5*    Assessment/Plan: Status post Cesarean section. Doing well postoperatively.  Continue current care.  CURTIS,CAROL G 09/21/2012, 7:54 AM

## 2012-09-21 NOTE — Lactation Note (Signed)
This note was copied from the chart of Paula Forensic scientist. Lactation Consultation Note: Initial visit with mom who reports that her nipples are too big for the baby to latch. We did get the baby to take a few sucks then she went off to sleep. Mom has manual pump at bedside- reports that it hurts when pumping. #30 flange given and mom reports that feels much better. Did obtain a few drops of Colostrum- baby licked off mom's finger. Mom has given bottles of formula because she thought the baby was hungry and she was not able to get the baby to latch on. Reviewed basic teaching- encouraged to watch for feeding cues and fed whenever she sees them. Encouraged to call for assist prn. BF brochure given with resources for support after DC. No questions at present.   Patient Name: Paula Cervantes [id number] Date: 09/21/2012 Reason for consult: Initial assessment   Maternal Data Formula Feeding for Exclusion: No Infant to breast within first hour of birth: Yes Has patient been taught Hand Expression?: Yes Does the patient have breastfeeding experience prior to this delivery?: No  Feeding Feeding Type: Breast Milk Length of feed: 5 min  LATCH Score/Interventions Latch: Repeated attempts needed to sustain latch, nipple held in mouth throughout feeding, stimulation needed to elicit sucking reflex.  Audible Swallowing: None  Type of Nipple: Everted at rest and after stimulation (very large nipples)  Comfort (Breast/Nipple): Soft / non-tender     Hold (Positioning): Assistance needed to correctly position infant at breast and maintain latch. Intervention(s): Breastfeeding basics reviewed;Support Pillows;Position options;Skin to skin  LATCH Score: 6  Lactation Tools Discussed/Used     Consult Status Consult Status: Follow-up Date: 09/22/12 Follow-up type: In-patient    [name] 09/21/2012, 1:49 PM

## 2012-09-21 NOTE — Anesthesia Postprocedure Evaluation (Signed)
Anesthesia Post Note  Patient: Forensic scientist  Procedure(s) Performed: Procedure(s) (LRB): PRIMARY CESAREAN SECTION (N/A)  Anesthesia type: Spinal  Patient location: Mother/Baby  Post pain: Pain level controlled  Post assessment: Post-op Vital signs reviewed  Last Vitals:  Filed Vitals:   09/21/12 1200  BP: 124/76  Pulse: 82  Temp: 36.7 C  Resp: 20    Post vital signs: Reviewed  Level of consciousness: awake  Complications: No apparent anesthesia complications

## 2012-09-22 MED ORDER — HYDROMORPHONE HCL 2 MG PO TABS
2.0000 mg | ORAL_TABLET | ORAL | Status: DC | PRN
  Administered 2012-09-22 (×3): 2 mg via ORAL
  Filled 2012-09-22 (×4): qty 1

## 2012-09-22 MED ORDER — OXYCODONE-ACETAMINOPHEN 5-325 MG PO TABS
1.0000 | ORAL_TABLET | ORAL | Status: DC | PRN
  Administered 2012-09-22 – 2012-09-23 (×4): 2 via ORAL
  Filled 2012-09-22 (×4): qty 2

## 2012-09-22 MED ORDER — IBUPROFEN 600 MG PO TABS
600.0000 mg | ORAL_TABLET | Freq: Four times a day (QID) | ORAL | Status: DC
  Administered 2012-09-22 – 2012-09-23 (×3): 600 mg via ORAL
  Filled 2012-09-22 (×3): qty 1

## 2012-09-22 NOTE — Progress Notes (Signed)
Patient feels that dilaudid is effective for pain control; however does not last very long and is needed very frequently. Patient states that percocet seemed to work better for pain; however the order was for only every 6 hours prn and patient felt that she needed pain medication sooner than every 6 hours after a c-section. Ibuprofen is also only 8 hours prn.  Called Dr. Renaldo Fiddler to request to try percocet every 4 hours and ibuprofen 600mg  every 6 hours in order for patient to have pain medication slightly more frequently. Dr. Renaldo Fiddler agreed with prn medication change. Will attempt and follow up with patient for better pain control. Earl Gala, Linda Hedges Osgood

## 2012-09-22 NOTE — Progress Notes (Signed)
Subjective: Postpartum Day 2: Cesarean Delivery Patient reports incisional pain, tolerating PO, + flatus and no problems voiding.    Objective: Vital signs in last 24 hours: Temp:  [97.9 F (36.6 C)-98.5 F (36.9 C)] 97.9 F (36.6 C) (08/20 0551) Pulse Rate:  [81-89] 89 (08/20 0551) Resp:  [18-20] 18 (08/20 0551) BP: (116-124)/(70-76) 116/70 mmHg (08/20 0551) SpO2:  [98 %-99 %] 99 % (08/20 0551)  Physical Exam:  General: alert and cooperative Lochia: appropriate Uterine Fundus: firm Incision: honeycomb dressing noted with old drainage on bandage DVT Evaluation: No evidence of DVT seen on physical exam. Negative Homan's sign. No cords or calf tenderness. No significant calf/ankle edema.   Recent Labs  09/21/12 0605  HGB 10.7*  HCT 31.5*    Assessment/Plan: Status post Cesarean section. Doing well postoperatively.  Change pain meds to dilaudid.  Paula Cervantes G 09/22/2012, 7:58 AM

## 2012-09-23 MED ORDER — IBUPROFEN 600 MG PO TABS: 600.0000 mg | ORAL_TABLET | Freq: Four times a day (QID) | ORAL | Status: DC

## 2012-09-23 MED ORDER — OXYCODONE-ACETAMINOPHEN 5-325 MG PO TABS: 1.0000 | ORAL_TABLET | ORAL | Status: DC | PRN

## 2012-09-23 NOTE — Discharge Summary (Signed)
Obstetric Discharge Summary Reason for Admission: cesarean section Prenatal Procedures: ultrasound Intrapartum Procedures: cesarean: low cervical, transverse Postpartum Procedures: none Complications-Operative and Postpartum: none Hemoglobin  Date Value Range Status  09/21/2012 10.7* 12.0 - 15.0 g/dL Final     HCT  Date Value Range Status  09/21/2012 31.5* 36.0 - 46.0 % Final    Physical Exam:  General: alert and cooperative Lochia: appropriate Uterine Fundus: firm Incision: honeycomb dressing noted with old drainage on bandage DVT Evaluation: No evidence of DVT seen on physical exam. Negative Homan's sign. No cords or calf tenderness. Calf/Ankle edema is present.  Discharge Diagnoses: Term Pregnancy-delivered  Discharge Information: Date: 09/23/2012 Activity: pelvic rest Diet: routine Medications: PNV, Ibuprofen and Percocet Condition: stable Instructions: refer to practice specific booklet Discharge to: home   Newborn Data: Live born female  Birth Weight: 7 lb 2.6 oz (3250 g) APGAR: 8, 9  Home with mother.  CURTIS,CAROL G 09/23/2012, 8:06 AM

## 2013-01-16 ENCOUNTER — Encounter (HOSPITAL_COMMUNITY): Payer: Self-pay | Admitting: Emergency Medicine

## 2013-01-16 ENCOUNTER — Emergency Department (HOSPITAL_COMMUNITY)
Admission: EM | Admit: 2013-01-16 | Discharge: 2013-01-16 | Disposition: A | Discharge status: Home / Self Care | Payer: Medicaid Other | Attending: Emergency Medicine | Admitting: Emergency Medicine

## 2013-01-16 DIAGNOSIS — Z8543 Personal history of malignant neoplasm of ovary: Secondary | ICD-10-CM | POA: Insufficient documentation

## 2013-01-16 DIAGNOSIS — Z8742 Personal history of other diseases of the female genital tract: Secondary | ICD-10-CM | POA: Insufficient documentation

## 2013-01-16 DIAGNOSIS — Z880 Allergy status to penicillin: Secondary | ICD-10-CM | POA: Insufficient documentation

## 2013-01-16 DIAGNOSIS — L720 Epidermal cyst: Secondary | ICD-10-CM

## 2013-01-16 DIAGNOSIS — L723 Sebaceous cyst: Secondary | ICD-10-CM | POA: Insufficient documentation

## 2013-01-16 DIAGNOSIS — M109 Gout, unspecified: Secondary | ICD-10-CM | POA: Insufficient documentation

## 2013-01-16 DIAGNOSIS — Z87891 Personal history of nicotine dependence: Secondary | ICD-10-CM | POA: Insufficient documentation

## 2013-01-16 DIAGNOSIS — J45909 Unspecified asthma, uncomplicated: Secondary | ICD-10-CM | POA: Insufficient documentation

## 2013-01-16 MED ORDER — CLINDAMYCIN HCL 150 MG PO CAPS: 450.0000 mg | ORAL_CAPSULE | Freq: Three times a day (TID) | ORAL | Status: DC

## 2013-01-16 NOTE — Discharge Instructions (Signed)
Please call and set up an appointment with urgent care Center to be reassessed Please call for an appointment with dermatology Please take medications as prescribed-please take out. Make Please apply warm compressions to site a massage at least 3-4 times per day Please closely monitor symptoms and if symptoms are to worsen or change (fever greater than 101, chills, chest pain, shortness of breath, difficulty breathing, worsening swelling to the cheek, redness, warmth or hotness to the cheek,, numbness, inability open or close jaw, inability to swallow) please report back to emergency department   Epidermal Cyst An epidermal cyst is usually a small, painless lump under the skin. Cysts often occur on the face, neck, stomach, chest, or genitals. The cyst may be filled with a bad smelling paste. Do not pop your cyst. Popping the cyst can cause pain and puffiness (swelling). HOME CARE   Only take medicines as told by your doctor.  Take your medicine (antibiotics) as told. Finish it even if you start to feel better. GET HELP RIGHT AWAY IF:  Your cyst is tender, red, or puffy.  You are not getting better, or you are getting worse.  You have any questions or concerns. MAKE SURE YOU:  Understand these instructions.  Will watch your condition.  Will get help right away if you are not doing well or get worse. Document Released: 02/28/2004 Document Revised: 07/22/2011 Document Reviewed: 07/29/2010 [facility] Patient Information 2014 [place], [place]. Abscess An abscess is an infected area that contains a collection of pus and debris.It can occur in almost any part of the body. An abscess is also known as a furuncle or boil. CAUSES  An abscess occurs when tissue gets infected. This can occur from blockage of oil or sweat glands, infection of hair follicles, or a minor injury to the skin. As the body tries to fight the infection, pus collects in the area and creates pressure under the skin. This  pressure causes pain. People with weakened immune systems have difficulty fighting infections and get certain abscesses more often.  SYMPTOMS Usually an abscess develops on the skin and becomes a painful mass that is red, warm, and tender. If the abscess forms under the skin, you may feel a moveable soft area under the skin. Some abscesses break open (rupture) on their own, but most will continue to get worse without care. The infection can spread deeper into the body and eventually into the bloodstream, causing you to feel ill.  DIAGNOSIS  Your caregiver will take your medical history and perform a physical exam. A sample of fluid may also be taken from the abscess to determine what is causing your infection. TREATMENT  Your caregiver may prescribe antibiotic medicines to fight the infection. However, taking antibiotics alone usually does not cure an abscess. Your caregiver may need to make a small cut (incision) in the abscess to drain the pus. In some cases, gauze is packed into the abscess to reduce pain and to continue draining the area. HOME CARE INSTRUCTIONS   Only take over-the-counter or prescription medicines for pain, discomfort, or fever as directed by your caregiver.  If you were prescribed antibiotics, take them as directed. Finish them even if you start to feel better.  If gauze is used, follow your caregiver's directions for changing the gauze.  To avoid spreading the infection:  Keep your draining abscess covered with a bandage.  Wash your hands well.  Do not share personal care items, towels, or whirlpools with others.  Avoid skin contact  with others.  Keep your skin and clothes clean around the abscess.  Keep all follow-up appointments as directed by your caregiver. SEEK MEDICAL CARE IF:   You have increased pain, swelling, redness, fluid drainage, or bleeding.  You have muscle aches, chills, or a general ill feeling.  You have a fever. MAKE SURE YOU:    Understand these instructions.  Will watch your condition.  Will get help right away if you are not doing well or get worse. Document Released: 10/30/2004 Document Revised: 07/22/2011 Document Reviewed: 04/04/2011 [facility] Patient Information 2014 [place], [place].   Emergency Department Resource Guide 1) Find a Doctor and Pay Out of Pocket Although you won't have to find out who is covered by your insurance plan, it is a good idea to ask around and get recommendations. You will then need to call the office and see if the doctor you have chosen will accept you as a new patient and what types of options they offer for patients who are self-pay. Some doctors offer discounts or will set up payment plans for their patients who do not have insurance, but you will need to ask so you aren't surprised when you get to your appointment.  2) Contact Your Local Health Department Not all health departments have doctors that can see patients for sick visits, but many do, so it is worth a call to see if yours does. If you don't know where your local health department is, you can check in your phone book. The CDC also has a tool to help you locate your state's health department, and many state websites also have listings of all of their local health departments.  3) Find a Walk-in Clinic If your illness is not likely to be very severe or complicated, you may want to try a walk in clinic. These are popping up all over the country in pharmacies, drugstores, and shopping centers. They're usually staffed by nurse practitioners or physician assistants that have been trained to treat common illnesses and complaints. They're usually fairly quick and inexpensive. However, if you have serious medical issues or chronic medical problems, these are probably not your best option.  No Primary Care Doctor: - Call Health Connect at  [phone] - they can help you locate a primary care doctor that  accepts your insurance,  provides certain services, etc. - Physician Referral Service- [phone]  Chronic Pain Problems: Organization         Address  Phone   Notes  [facility]  [phone] Patients need to be referred by their primary care doctor.   Medication Assistance: Organization         Address  Phone   Notes  [facility] Medication [facility] [address] [place], [place] [address] [phone] --Must be a resident of [facility] -- Must have NO insurance coverage whatsoever (no Medicaid/ Medicare, etc.) -- The pt. MUST have a primary care doctor that directs their care regularly and follows them in the community   Med[facility]ist  [phone]   [employer]  [phone]    Agencies that provide inexpensive medical care: Organization         Address  Phone   Notes  [facility]  [phone]   [facility]    [phone]   [facility] [address] [place], [place] [address] [phone]   [facility] [address] [place]  [place] [phone]   Planned Parenthood    [phone]   [facility]    [phone]   Community Health and [facility]  [address], [place] Phone:  [phone], Fax:  [phone] Hours of Operation:  9 am - 6 pm, M-F.  Also accepts Medicaid/Medicare and self-pay.  [facility]  [address], [place] Phone: [phone], Fax: [phone]. Hours of Operation:  8:30 am - 5:30 pm, M-F.  Also accepts Medicaid and self-pay.  [facility] [place] [address], [place] Phone: [phone]   [facility] [address] Natasha Bence [place], [place] [phone], Ext. [phone] Mondays & Thursdays: 7-9 AM.  First 15 patients are seen on a first come, first serve basis.    Medicaid-accepting [facility] Providers:  Organization         Address  Phone    Notes  [facility] [address], [place] [phone] Also accepts self-pay patients.  [facility] [address] Laurell Josephs [employer], [place]  [phone]   [facility] [address], [place] [phone]   [facility] [address], [place] [phone]   Renaye Rakers [address], [place]   [phone] Only accepts [place] Access [place] patients after they have their name applied to their card.   Self-Pay (no insurance) in [facility]:  Organization         Address  Phone   Notes  Sickle Cell Patients, [facility] Medicine [address] [place], [place] [phone]   [facility] [address] [place], [place] [phone]   [facility] [place]  [address], [place] [phone]   [facility]/Dr. Osei-Bonsu  [address], [place] [employer] or [address], [place] [phone] Phone number for both [place] and [place] locations is the same.  Urgent Medical and [facility] [address], [place] [phone]   [facility] [address] [place] or [address] [phone]   [facility] [address] [place] [phone], phone; [phone], fax Sees patients 1st and 3rd Saturday of every month.  Must not qualify for public or private insurance (i.e. Medicaid, Medicare, [place] Health Choice, Veterans' Benefits)  Household income should be no more than 200% of the poverty level The clinic cannot treat you if you are pregnant or think you are pregnant  Sexually transmitted diseases are not treated at the clinic.    Dental Care: Organization         Address  Phone  Notes  [facility] of [facility] [address] [place], [place] [phone] Accepts children up to age 93 who are enrolled in [place] or [place] Health Choice; pregnant women with a Medicaid card; and children who have applied for Medicaid or [place] Health Choice, but were declined, whose parents can pay a reduced fee at time of service.  [facility] Department of [facility]  [address], [place] [phone] Accepts children up to age 78 who are enrolled in [place] or [place] Health Choice; pregnant women with a Medicaid card; and children who have applied for Medicaid or [place]  Health Choice, but were declined, whose parents can pay a reduced fee at time of service.  [facility]  [address] [place], [place] [phone] Patients are seen by appointment only. Walk-ins are not accepted. [facility] will see patients 60 years of age and older. Monday - Tuesday (8am-5pm) Most Wednesdays (8:30-5pm) $30 per visit, cash only  [facility]  [address], [facility] [phone] Patients are seen by appointment only. Walk-ins are not accepted. [facility] will see patients 17 years of age and older. One Wednesday Evening (Monthly: Volunteer Based).  $30 per visit, cash only  [employer]  [phone] for adults; Children under age 51, call Graduate Pediatric Dentistry at [phone]. Children aged 37-14, please call [phone] to request a pediatric application.  Dental services are provided in all areas of dental care including fillings, crowns and bridges, complete and partial dentures, implants, gum treatment, root canals, and extractions. Preventive care is also provided. Treatment is provided to both adults and children. Patients are selected via a lottery and there is often a waiting list.   [facility] [address], [place]  [phone] [email or web address]   [facility] [address] [place], [place] [phone], Ext.  123 Second and Fourth Thursday of each month, opens at 6:30 AM; Clinic ends at 9 AM.  Patients are seen on a first-come first-served basis, and a limited number are seen during each clinic.   [facility]  [address] Ether Griffins [place], [place] [phone]   Eligibility Requirements You must have lived in [place], [place], or [place] counties for at least the last three months.   You cannot be eligible for state or federal sponsored [place], including [employer], [place], or [employer].   You generally cannot be eligible for healthcare insurance through your employer.    How to apply: Eligibility screenings are held every Tuesday and Wednesday afternoon from 1:00 pm until 4:00 pm. You do not need an appointment for the interview!  [facility] [address] [place], [place] [phone]   [facility] Health Department  [phone]   [facility] Health Department  [phone]   [facility] Health Department  [phone]    Behavioral Health Resources in the Community: Intensive Outpatient Programs Organization         Address  Phone  Notes  [facility] Services [address] [place], [place] [phone]   [facility] Outpatient [address], [place], [place] [phone]   [facility]: [facility] [address], [place], [place]  [phone]   [facility] [address],  [place], [place] [phone] or [phone]   Substance Abuse Resources Organization         Address  Phone  Notes  [facility]  [phone]   [facility]  [phone]   [facility] [place]  [phone]   Floydene Flock  [phone]   Residential & Outpatient Substance Abuse Program  [phone]   Psychological Services Organization         Address  Phone  Notes  [facility] Behavioral Health  [phone]   [facility] Services  [phone]   [facility]  Mental Health [address] [place] [phone] or [phone]    Mobile Crisis Teams Organization         Address  Phone  Notes  [facility], Mobile Crisis Care Unit  [phone]   [facility]  [address] [place], [place]   [facility] [address] [place] [phone]    Self-Help/Support Groups Organization         Address  Phone             Notes  [facility] [place] - variety of support groups  [place] Call for more information  Narcotics Anonymous (NA), [facility] [address], [employer] [place]  2 meetings at this location   Special educational needs teacher         Address  Phone  Notes  [facility] [place],    [place]  [phone]   [facility]  [address] [place] [address]84166, [place], [place]   Geary Amelia, [place] [phone] Admissions: 8am-3pm M-F  Incentives Substance Ali Molina [address],    [place], [place] [phone]   [facility] [address] [place], [place], [place]   [facility] [address]  [place], [place]   [facility] - Intensive Outpatient [address] Kristeen Mans [address], [place], [place]   [facility] ([place].,  [place], [place] [phone] or [phone]   [facility] ([facility]) [address], [place], [place] Accepts Medicaid  [facility] [place] [address],  [place] [phone] Substance Abuse/Addiction Treatment   [facility] Organization         Address  Phone  Notes  [facility]  [phone]   Domenic Schwab, PhD [address] Arlis [place], [place]   [phone] or [phone]   [place]  [place], [place] [phone]   [facility] [address] [place], [place] [phone] Insurance/Medicaid/sponsorship through [facility] and Families [address] [place] [address]                                    [place], [place] [phone] [place] [address], [place] [phone]    Dr. Adele Schilder  [phone]   [facility] Dept. 1) [address], [place] 2) [place] 3)  [place] [address], [place] [phone]  [phone]   [place] [phone] or [phone] (After Hours)

## 2013-01-16 NOTE — ED Provider Notes (Signed)
CSN: [id number]     Arrival date & time 01/16/13  1802 History   This chart was scribed for non-physician practitioner Raymon Mutton, PA-C working with Richardean Canal, MD by Caryn Bee, ED Scribe. This patient was seen in room TR06C/TR06C and the patient's care was started at 7:32 PM.    Chief Complaint  Patient presents with  . Abscess   HPI HPI Comments: Paula Cervantes is a 37 y.o. female who presents to the Emergency Department complaining of gradual onset facial lesion, onset 3 weeks ago. Stated that the lesion has gotten progressively larger. Pt attempted to squeeze the area last night, reported that she used a pin to make an incision into the lesion and applied pressure with negative drainage. She reports associated tenderness with palpation to the region. Pt states she has used antibiotics, Doxycycline, since onset with no improvement. She denies using warm compressions. Denies tingling, trouble swallowing, fever, chills. Pt is not breastfeeding. Pt does not have PCP.   Past Medical History  Diagnosis Date  . Abnormal Pap smear   . Fibroids   . Endometriosis   . Gout   . Ovarian cancer   . Asthma     no episodes/no inhaler needed   Past Surgical History  Procedure Laterality Date  . Ovarian fibroids    . Knee surgery    . No past surgeries    . Cryotherapy    . Diagnostic laparoscopy    . Cesarean section N/A 09/20/2012    Procedure: PRIMARY CESAREAN SECTION;  Surgeon: Meriel Pica, MD;  Location: [facility];  Service: Obstetrics;  Laterality: N/A;  Primary edc 8/25   No family history on file. History  Substance Use Topics  . Smoking status: Former Games developer  . Smokeless tobacco: Never Used  . Alcohol Use: No   OB History   Grav Para Term Preterm Abortions TAB SAB Ect Mult Living   2 1 1       1      Review of Systems  Constitutional: Negative for fever and chills.  HENT: Negative for trouble swallowing.   Skin: Negative for wound.       Facial lesion    Neurological: Negative for weakness and headaches.  All other systems reviewed and are negative.    Allergies  Penicillins; Sulfa antibiotics; Aspirin; and Coconut flavor  Home Medications   Current Outpatient Rx  Name  Route  Sig  Dispense  Refill  . ibuprofen (ADVIL,MOTRIN) 600 MG tablet   Oral   Take 1 tablet (600 mg total) by mouth every 6 (six) hours.   30 tablet   1   . clindamycin (CLEOCIN) 150 MG capsule   Oral   Take 3 capsules (450 mg total) by mouth 3 (three) times daily.   90 capsule   0    BP 142/81  Pulse 88  Temp(Src) 98 F (36.7 C) (Oral)  Resp 16  Wt 191 lb (86.637 kg)  SpO2 100%  Physical Exam  Nursing note and vitals reviewed. Constitutional: She is oriented to person, place, and time. She appears well-developed and well-nourished. No distress.  HENT:  Head: Normocephalic and atraumatic.  Negative facial swelling Approximately 1 cm x 1 cm lesion localized to the left cheek - indurated, hard upon palpation. Negative surrounding erythema, inflammation, swelling. Negative warmth upon palpation. Negative active drainage or bleeding noted. Negative trismus. Discomfort upon palpation.   Eyes: Conjunctivae and EOM are normal. Pupils are equal, round, and reactive to light.  Right eye exhibits no discharge. Left eye exhibits no discharge.  Neck: Normal range of motion. Neck supple. No tracheal deviation present.  Negative neck stiffness Negative nuchal rigidity Negative cervical lymphadenopathy  Cardiovascular: Normal rate, regular rhythm and normal heart sounds.   No murmur heard. Pulses:      Radial pulses are 2+ on the right side, and 2+ on the left side.  Pulmonary/Chest: Effort normal and breath sounds normal. No respiratory distress. She has no wheezes. She has no rales.  Musculoskeletal: Normal range of motion.  Lymphadenopathy:    She has no cervical adenopathy.  Neurological: She is alert and oriented to person, place, and time. No cranial  nerve deficit.  Cranial nerves III through XII grossly intact  Skin: Skin is warm and dry. No erythema.  Please see HEENT  Psychiatric: She has a normal mood and affect. Her behavior is normal.    ED Course  Procedures (including critical care time) DIAGNOSTIC STUDIES: Oxygen Saturation is 100% on room air, normal by my interpretation.    COORDINATION OF CARE: 7:38 PM-Discussed treatment plan with pt at bedside and pt agreed to plan.   Patient reported that she is not breast feeding her child.   Labs Review Labs Reviewed - No data to display Imaging Review No results found.  EKG Interpretation   None       MDM   1. Epidermal cyst of face    Filed Vitals:   01/16/13 1804  BP: 142/81  Pulse: 88  Temp: 98 F (36.7 C)  Resp: 16   I personally performed the services described in this documentation, which was scribed in my presence. The recorded information has been reviewed and is accurate.  Patient presenting to emergency department with lesion on the left cheek that started 2-3 weeks ago and has gradually gotten larger. Patient reports she used a pin to make an incision into the lesion and applied pressure with negative results of drainage.  Alert and oriented. GCS 15. Heart rate and rhythm normal. Lungs clear to auscultation to upper and lower lobes bilaterally. Pulses palpable and strong, radial 2+ bilaterally. Negative neck stiffness, negative cervical lymphadenopathy. Approximately 1 cm x 1 cm lesion localized to the left cheek, indurated-hard upon palpation. Negative surrounding erythema, inflammation, swelling, bleeding, active drainage, warmth upon palpation to the site. Negative trismus. Suspicion to be possible epidermal cyst, cannot rule out abscess. Patient stable, afebrile. Discharged patient with antibiotics. Discussed with patient to apply warm compressions and massage. Referred patient to urgent care Center and dermatologist. Discussed with patient to rest and  stay hydrated. Discussed with patient to closely monitor symptoms-educated patient what to watch out for-if symptoms are to worsen or change to report back to emergency department - strict return instructions given. Patient agreed to plan of care, understood, all questions answered.  Raymon Mutton, PA-C 01/18/13 1335

## 2013-01-16 NOTE — ED Notes (Signed)
The pt has a lesion on the lt side of her face that has been there for 2 weeks.  The area is getting larger and starting to hurt after she attempted to squeeze it

## 2013-01-18 ENCOUNTER — Encounter (HOSPITAL_COMMUNITY): Payer: Self-pay | Admitting: Emergency Medicine

## 2013-01-18 ENCOUNTER — Emergency Department (INDEPENDENT_AMBULATORY_CARE_PROVIDER_SITE_OTHER)
Admission: EM | Admit: 2013-01-18 | Discharge: 2013-01-18 | Disposition: A | Discharge status: Home / Self Care | Payer: BC Managed Care – PPO | Source: Home / Self Care

## 2013-01-18 DIAGNOSIS — L723 Sebaceous cyst: Secondary | ICD-10-CM

## 2013-01-18 DIAGNOSIS — L720 Epidermal cyst: Secondary | ICD-10-CM

## 2013-01-18 DIAGNOSIS — L0291 Cutaneous abscess, unspecified: Secondary | ICD-10-CM

## 2013-01-18 MED ORDER — ACETAMINOPHEN 325 MG PO TABS
650.0000 mg | ORAL_TABLET | Freq: Once | ORAL | Status: AC
  Administered 2013-01-18: 650 mg via ORAL

## 2013-01-18 MED ORDER — ACETAMINOPHEN 325 MG PO TABS
ORAL_TABLET | ORAL | Status: AC
  Filled 2013-01-18: qty 2

## 2013-01-18 NOTE — ED Provider Notes (Signed)
Irena Witcher is a 37 y.o. female who presents to Urgent Care today for evaluation of cyst and requested I+D.  Patient was seen in ED on 01/16/13 and was diagnosed with an epidermal inclusion cyst of the left cheek. Was diagnosed as being infected and was placed on clindamycin. Area was not I+Ded. Instead, she was referred to urgent care. Patient states she has had the lesion for 2-3 weeks and it has been slowly growing. Only mildly painful. Seems to have formed a head over the last week. Patient tried to poke it with a needle early in its course but it seemed to get worse. Has tried warm compresses. Continues to grow despite above treatments. She is requesting an I+D today as she was hoping to have that done in the ED 2 days ago but was referred here.   ROS-Denies nausea/vomiting/fever/chills/fatigue/overall sick feelings. No history of similar lesions. Did develop a headache in the waiting room from someone's cologne (this happens with strong smells frequently to patient). No trouble eating or drinking due to this  Past medical history-endometriosis, fibroids, gout, asthma (may have grown out of this)  Surgical history -fibroid surgery, knee surgery, c-section, dagnostic laparoscopy Medications-clindamycin 450mg  TID, ibuprofen 600mg  q6 hours.  Allergies-PCN, sulfa, aspirin, coconut flavor Social history . Smoking status: Former Games developer  . Smokeless tobacco: Never Used  . Alcohol Use: No   ROS as above  Exam:  BP 145/84  Pulse 83  Temp(Src) 97.3 F (36.3 C) (Oral)  Resp 20  SpO2 100%  LMP 01/09/2013  Breastfeeding? No Gen: Well NAD Face: 2x2 indurated area with 1.5 x1.5 surface level lesion. Slight surrounding erythema. Slight pain with palpation. No warmth. No active drainage.  HEENT: Mucous membranes are moist. Lungs: Normal work of breathing. CTABL Heart: RRR no MRG  Procedure:  Incision and drainage of abscess Risks, benefits, and alternatives explained. Verbal consent  obtained. Time out conducted. Surface cleaned with alcohol. 5 cc lidocaine with epinephine infiltrated around abscess. Adequate anesthesia ensured. Area prepped and draped in a sterile fashion. #11 blade used to make a stab incision into abscess. Pus expressed with pressure. Curved hemostat used to explore 4 quadrants . Able to pull out a partial capsule.  2 inches of iodoform packing placed leaving a 1-inch tail. Hemostasis achieved. Pt stable. Aftercare and follow-up advised.  Assessment and Plan: 37 y.o. female with epidermal inclusion cyst likely superinfected. S/p I+D.   Given pus exudate and continued expansion suspect this area had become an abscess. I+D performed today. Was able to pull out part of capsule as well as pack area. Hope this will reduce reoccurrence and help area to continue to drain.   Patient expressed desire to establish with a PCP and asked if I could become PCP at family practice. Advised patient she can call be to palced on weighting list. If establishes, systolic BP mildly elevated x 2 occasions and this should be addressed further (likely DASH diet to start)    Shelva Majestic, MD 01/18/13 2124

## 2013-01-18 NOTE — ED Provider Notes (Signed)
Medical screening examination/treatment/procedure(s) were performed by non-physician practitioner and as supervising physician I was immediately available for consultation/collaboration.  EKG Interpretation   None         David H Yao, MD 01/18/13 1426 

## 2013-01-18 NOTE — ED Notes (Signed)
C/o cyst on left side of face x 1 month.  Gradually getting bigger.  Pt states that she tried to drain it with no relief.

## 2013-01-19 NOTE — ED Provider Notes (Signed)
Medical screening examination/treatment/procedure(s) were performed by resident physician or non-physician practitioner and as supervising physician I was immediately available for consultation/collaboration.   Halei Hanover DOUGLAS MD.   Demika Langenderfer D Janeene Sand, MD 01/19/13 1527 

## 2013-03-27 ENCOUNTER — Encounter (HOSPITAL_COMMUNITY): Payer: Self-pay | Admitting: *Deleted

## 2013-03-27 ENCOUNTER — Inpatient Hospital Stay (HOSPITAL_COMMUNITY)
Admission: AD | Admit: 2013-03-27 | Discharge: 2013-03-27 | Disposition: A | Discharge status: Home / Self Care | Payer: BC Managed Care – PPO | Source: Ambulatory Visit | Attending: Obstetrics & Gynecology | Admitting: Obstetrics & Gynecology

## 2013-03-27 DIAGNOSIS — R05 Cough: Secondary | ICD-10-CM | POA: Insufficient documentation

## 2013-03-27 DIAGNOSIS — J069 Acute upper respiratory infection, unspecified: Secondary | ICD-10-CM | POA: Insufficient documentation

## 2013-03-27 DIAGNOSIS — J029 Acute pharyngitis, unspecified: Secondary | ICD-10-CM | POA: Insufficient documentation

## 2013-03-27 DIAGNOSIS — Z3490 Encounter for supervision of normal pregnancy, unspecified, unspecified trimester: Secondary | ICD-10-CM

## 2013-03-27 DIAGNOSIS — O9989 Other specified diseases and conditions complicating pregnancy, childbirth and the puerperium: Principal | ICD-10-CM

## 2013-03-27 DIAGNOSIS — O99891 Other specified diseases and conditions complicating pregnancy: Secondary | ICD-10-CM | POA: Insufficient documentation

## 2013-03-27 DIAGNOSIS — R059 Cough, unspecified: Secondary | ICD-10-CM | POA: Insufficient documentation

## 2013-03-27 DIAGNOSIS — Z87891 Personal history of nicotine dependence: Secondary | ICD-10-CM | POA: Insufficient documentation

## 2013-03-27 LAB — URINALYSIS, ROUTINE W REFLEX MICROSCOPIC
BILIRUBIN URINE: NEGATIVE
GLUCOSE, UA: NEGATIVE mg/dL
KETONES UR: NEGATIVE mg/dL
Nitrite: NEGATIVE
PH: 7 (ref 5.0–8.0)
PROTEIN: NEGATIVE mg/dL
Specific Gravity, Urine: 1.02 (ref 1.005–1.030)
Urobilinogen, UA: 0.2 mg/dL (ref 0.0–1.0)

## 2013-03-27 LAB — URINE MICROSCOPIC-ADD ON

## 2013-03-27 LAB — POCT PREGNANCY, URINE: Preg Test, Ur: POSITIVE — AB

## 2013-03-27 MED ORDER — PSEUDOEPHEDRINE HCL 30 MG PO TABS: 30.0000 mg | ORAL_TABLET | ORAL | Status: DC | PRN

## 2013-03-27 MED ORDER — GUAIFENESIN ER 600 MG PO TB12: 600.0000 mg | ORAL_TABLET | Freq: Two times a day (BID) | ORAL | Status: DC

## 2013-03-27 NOTE — MAU Provider Note (Signed)
History     CSN: [id number]  Arrival date and time: 03/27/13 1826   None     No chief complaint on file.  HPI This is a 38 y.o. female at Unknown [place] (11.1 wks by LMP, but only had one period since August 2014 delivery) who presents with c/o sore throat and cough for a week. Had fever of 102 for two days last week. No fever since.  Denies pain in ears or other issues. Just found out she was pregnant, has a 65 month old (born August)   RN Note: Pt had +HPT 4 days ago; sore throat and cough for the past week, taking robitussin DM and gargling with salt water. Cough is worsening, bringing up yellow brown sputum. Denies ear ache, fevere, diarrhea, or N/V.       OB History   Grav Para Term Preterm Abortions TAB SAB Ect Mult Living   2 1 1       1       Past Medical History  Diagnosis Date  . Abnormal Pap smear   . Fibroids   . Endometriosis   . Gout   . Ovarian cancer   . Asthma     no episodes/no inhaler needed    Past Surgical History  Procedure Laterality Date  . Ovarian fibroids    . Knee surgery    . No past surgeries    . Cryotherapy    . Diagnostic laparoscopy    . Cesarean section N/A 09/20/2012    Procedure: PRIMARY CESAREAN SECTION;  Surgeon: Margarette Asal, MD;  Location: [facility];  Service: Obstetrics;  Laterality: N/A;  Primary edc 8/25    History reviewed. No pertinent family history.  History  Substance Use Topics  . Smoking status: Former Smoker    Quit date: 03/27/2002  . Smokeless tobacco: Never Used  . Alcohol Use: No    Allergies:  Allergies  Allergen Reactions  . Penicillins Nausea And Vomiting and Other (See Comments)    Caused patient to pass out.   . Sulfa Antibiotics Other (See Comments)    Blisters in mouth and throat  . Aspirin Hives    Pt states she can take Ibuprofen  . Coconut Flavor Hives    Prescriptions prior to admission  Medication Sig Dispense Refill  . clindamycin (CLEOCIN) 150 MG capsule Take 3 capsules (450 mg  total) by mouth 3 (three) times daily.  90 capsule  0  . ibuprofen (ADVIL,MOTRIN) 600 MG tablet Take 1 tablet (600 mg total) by mouth every 6 (six) hours.  30 tablet  1    Review of Systems  Constitutional: Negative for fever, chills and malaise/fatigue.  HENT: Positive for congestion and sore throat. Negative for ear discharge and ear pain.   Eyes: Negative for blurred vision.  Respiratory: Positive for cough and sputum production. Negative for shortness of breath and wheezing.   Cardiovascular: Negative for chest pain.  Gastrointestinal: Negative for nausea, vomiting and abdominal pain.  Neurological: Negative for headaches.   Physical Exam   Last menstrual period 01/09/2013, not currently breastfeeding.  Physical Exam  Constitutional: She is oriented to person, place, and time. She appears well-developed and well-nourished. No distress.  HENT:  Head: Normocephalic.  Right Ear: External ear normal.  Left Ear: External ear normal.  Nose: Nose normal.  Mouth/Throat: Oropharynx is clear and moist. No oropharyngeal exudate (Minimal erethema in throat).  Eyes: Conjunctivae are normal. Pupils are equal, round, and reactive to light. Right eye  exhibits no discharge. Left eye exhibits no discharge.  Neck: Normal range of motion. Neck supple.  Cardiovascular: Normal rate.   Respiratory: Effort normal.  GI: Soft. There is no tenderness. There is no rebound and no guarding.  Unable to hear FHTs  Musculoskeletal: Normal range of motion.  Neurological: She is alert and oriented to person, place, and time.  Skin: Skin is warm and dry.  Psychiatric: She has a normal mood and affect.    MAU Course  Procedures   Assessment and Plan  A:  Pregnancy       URI, suspect this is post-influenza due to hx high fever last week at onset of symptoms        P:  Discussed URI       Recommend usual cold care, especially Mucinex.  Come back if fever returns or becomes SOB       Outpatient [place]  ordered        Plans to return to Dr Matthew Saras for [facility] care  [facility] 03/27/2013, 7:33 PM

## 2013-03-27 NOTE — MAU Note (Signed)
Pt had +HPT 4 days ago; sore throat and cough for the past week, taking robitussin DM and gargling with salt water.  Cough is worsening, bringing up yellow brown sputum. Denies ear ache, fevere, diarrhea, or N/V.

## 2013-03-27 NOTE — Discharge Instructions (Signed)
Pregnancy - First Trimester During sexual intercourse, millions of sperm go into the vagina. Only 1 sperm will penetrate and fertilize the female egg while it is in the Fallopian tube. One week later, the fertilized egg implants into the wall of the uterus. An embryo begins to develop into a baby. At 6 to 8 weeks, the eyes and face are formed and the heartbeat can be seen on ultrasound. At the end of 12 weeks (first trimester), all the baby's organs are formed. Now that you are pregnant, you will want to do everything you can to have a healthy baby. Two of the most important things are to get good prenatal care and follow your caregiver's instructions. Prenatal care is all the medical care you receive before the baby's birth. It is given to prevent, find, and treat problems during the pregnancy and childbirth. PRENATAL EXAMS  During prenatal visits, your weight, blood pressure, and urine are checked. This is done to make sure you are healthy and progressing normally during the pregnancy.  A pregnant woman should gain 25 to 35 pounds during the pregnancy. However, if you are overweight or underweight, your caregiver will advise you regarding your weight.  Your caregiver will ask and answer questions for you.  Blood work, cervical cultures, other necessary tests, and a Pap test are done during your prenatal exams. These tests are done to check on your health and the probable health of your baby. Tests are strongly recommended and done for HIV with your permission. This is the virus that causes AIDS. These tests are done because medicines can be given to help prevent your baby from being born with this infection should you have been infected without knowing it. Blood work is also used to find out your blood type, previous infections, and follow your blood levels (hemoglobin).  Low hemoglobin (anemia) is common during pregnancy. Iron and vitamins are given to help prevent this. Later in the pregnancy, blood  tests for diabetes will be done along with any other tests if any problems develop.  You may need other tests to make sure you and the baby are doing well. CHANGES DURING THE FIRST TRIMESTER  Your body goes through many changes during pregnancy. They vary from person to person. Talk to your caregiver about changes you notice and are concerned about. Changes can include:  Your menstrual period stops.  The egg and sperm carry the genes that determine what you look like. Genes from you and your partner are forming a baby. The female genes determine whether the baby is a boy or a girl.  Your body increases in girth and you may feel bloated.  Feeling sick to your stomach (nauseous) and throwing up (vomiting). If the vomiting is uncontrollable, call your caregiver.  Your breasts will begin to enlarge and become tender.  Your nipples may stick out more and become darker.  The need to urinate more. Painful urination may mean you have a bladder infection.  Tiring easily.  Loss of appetite.  Cravings for certain kinds of food.  At first, you may gain or lose a couple of pounds.  You may have changes in your emotions from day to day (excited to be pregnant or concerned something may go wrong with the pregnancy and baby).  You may have more vivid and strange dreams. HOME CARE INSTRUCTIONS   It is very important to avoid all smoking, alcohol and non-prescribed drugs during your pregnancy. These affect the formation and growth of the baby.  Avoid chemicals while pregnant to ensure the delivery of a healthy infant.  Start your prenatal visits by the 12th week of pregnancy. They are usually scheduled monthly at first, then more often in the last 2 months before delivery. Keep your caregiver's appointments. Follow your caregiver's instructions regarding medicine use, blood and lab tests, exercise, and diet.  During pregnancy, you are providing food for you and your baby. Eat regular, well-balanced  meals. Choose foods such as meat, fish, milk and other low fat dairy products, vegetables, fruits, and whole-grain breads and cereals. Your caregiver will tell you of the ideal weight gain.  You can help morning sickness by keeping soda crackers at the bedside. Eat a couple before arising in the morning. You may want to use the crackers without salt on them.  Eating 4 to 5 small meals rather than 3 large meals a day also may help the nausea and vomiting.  Drinking liquids between meals instead of during meals also seems to help nausea and vomiting.  A physical sexual relationship may be continued throughout pregnancy if there are no other problems. Problems may be early (premature) leaking of amniotic fluid from the membranes, vaginal bleeding, or belly (abdominal) pain.  Exercise regularly if there are no restrictions. Check with your caregiver or physical therapist if you are unsure of the safety of some of your exercises. Greater weight gain will occur in the last 2 trimesters of pregnancy. Exercising will help:  Control your weight.  Keep you in shape.  Prepare you for labor and delivery.  Help you lose your pregnancy weight after you deliver your baby.  Wear a good support or jogging bra for breast tenderness during pregnancy. This may help if worn during sleep too.  Ask when prenatal classes are available. Begin classes when they are offered.  Do not use hot tubs, steam rooms, or saunas.  Wear your seat belt when driving. This protects you and your baby if you are in an accident.  Avoid raw meat, uncooked cheese, cat litter boxes, and soil used by cats throughout the pregnancy. These carry germs that can cause birth defects in the baby.  The first trimester is a good time to visit your dentist for your dental health. Getting your teeth cleaned is okay. Use a softer toothbrush and brush gently during pregnancy.  Ask for help if you have financial, counseling, or nutritional needs  during pregnancy. Your caregiver will be able to offer counseling for these needs as well as refer you for other special needs.  Do not take any medicines or herbs unless told by your caregiver.  Inform your caregiver if there is any mental or physical domestic violence.  Make a list of emergency phone numbers of family, friends, hospital, and police and fire departments.  Write down your questions. Take them to your prenatal visit.  Do not douche.  Do not cross your legs.  If you have to stand for long periods of time, rotate you feet or take small steps in a circle.  You may have more vaginal secretions that may require a sanitary pad. Do not use tampons or scented sanitary pads. MEDICINES AND DRUG USE IN PREGNANCY  Take prenatal vitamins as directed. The vitamin should contain 1 milligram of folic acid. Keep all vitamins out of reach of children. Only a couple vitamins or tablets containing iron may be fatal to a baby or young child when ingested.  Avoid use of all medicines, including herbs, over-the-counter medicines, not  prescribed or suggested by your caregiver. Only take over-the-counter or prescription medicines for pain, discomfort, or fever as directed by your caregiver. Do not use aspirin, ibuprofen, or naproxen unless directed by your caregiver.  Let your caregiver also know about herbs you may be using.  Alcohol is related to a number of birth defects. This includes fetal alcohol syndrome. All alcohol, in any form, should be avoided completely. Smoking will cause low birth rate and premature babies.  Street or illegal drugs are very harmful to the baby. They are absolutely forbidden. A baby born to an addicted mother will be addicted at birth. The baby will go through the same withdrawal an adult does.  Let your caregiver know about any medicines that you have to take and for what reason you take them. SEEK MEDICAL CARE IF:  You have any concerns or worries during your  pregnancy. It is better to call with your questions if you feel they cannot wait, rather than worry about them. SEEK IMMEDIATE MEDICAL CARE IF:   An unexplained oral temperature above 102 F (38.9 C) develops, or as your caregiver suggests.  You have leaking of fluid from the vagina (birth canal). If leaking membranes are suspected, take your temperature and inform your caregiver of this when you call.  There is vaginal spotting or bleeding. Notify your caregiver of the amount and how many pads are used.  You develop a bad smelling vaginal discharge with a change in the color.  You continue to feel sick to your stomach (nauseated) and have no relief from remedies suggested. You vomit blood or coffee ground-like materials.  You lose more than 2 pounds of weight in 1 week.  You gain more than 2 pounds of weight in 1 week and you notice swelling of your face, hands, feet, or legs.  You gain 5 pounds or more in 1 week (even if you do not have swelling of your hands, face, legs, or feet).  You get exposed to [place] measles and have never had them.  You are exposed to fifth disease or chickenpox.  You develop belly (abdominal) pain. Round ligament discomfort is a common non-cancerous (benign) cause of abdominal pain in pregnancy. Your caregiver still must evaluate this.  You develop headache, fever, diarrhea, pain with urination, or shortness of breath.  You fall or are in a car accident or have any kind of trauma.  There is mental or physical violence in your home. Document Released: 01/14/2001 Document Revised: 10/15/2011 Document Reviewed: 07/18/2008 [facility] Patient Information 2014 [place]. Upper Respiratory Infection, Adult An upper respiratory infection (URI) is also known as the common cold. It is often caused by a type of germ (virus). Colds are easily spread (contagious). You can pass it to others by kissing, coughing, sneezing, or drinking out of the same glass.  Usually, you get better in 1 or 2 weeks.  HOME CARE   Only take medicine as told by your doctor.  Use a warm mist humidifier or breathe in steam from a hot shower.  Drink enough water and fluids to keep your pee (urine) clear or pale yellow.  Get plenty of rest.  Return to work when your temperature is back to normal or as told by your doctor. You may use a face mask and wash your hands to stop your cold from spreading. GET HELP RIGHT AWAY IF:   After the first few days, you feel you are getting worse.  You have questions about your medicine.  You have chills, shortness of breath, or brown or red spit (mucus).  You have yellow or brown snot (nasal discharge) or pain in the face, especially when you bend forward.  You have a fever, puffy (swollen) neck, pain when you swallow, or white spots in the back of your throat.  You have a bad headache, ear pain, sinus pain, or chest pain.  You have a high-pitched whistling sound when you breathe in and out (wheezing).  You have a lasting cough or cough up blood.  You have sore muscles or a stiff neck. MAKE SURE YOU:   Understand these instructions.  Will watch your condition.  Will get help right away if you are not doing well or get worse. Document Released: 07/09/2007 Document Revised: 04/14/2011 Document Reviewed: 05/27/2010 [facility] Patient Information 2014 [place], [place].

## 2013-03-31 ENCOUNTER — Encounter (HOSPITAL_COMMUNITY): Payer: Self-pay | Admitting: Emergency Medicine

## 2013-03-31 ENCOUNTER — Emergency Department (HOSPITAL_COMMUNITY)
Admission: EM | Admit: 2013-03-31 | Discharge: 2013-04-01 | Disposition: A | Discharge status: Home / Self Care | Payer: BC Managed Care – PPO | Attending: Emergency Medicine | Admitting: Emergency Medicine

## 2013-03-31 DIAGNOSIS — Z880 Allergy status to penicillin: Secondary | ICD-10-CM | POA: Insufficient documentation

## 2013-03-31 DIAGNOSIS — Z8639 Personal history of other endocrine, nutritional and metabolic disease: Secondary | ICD-10-CM | POA: Insufficient documentation

## 2013-03-31 DIAGNOSIS — Z87891 Personal history of nicotine dependence: Secondary | ICD-10-CM | POA: Insufficient documentation

## 2013-03-31 DIAGNOSIS — D219 Benign neoplasm of connective and other soft tissue, unspecified: Secondary | ICD-10-CM

## 2013-03-31 DIAGNOSIS — J45909 Unspecified asthma, uncomplicated: Secondary | ICD-10-CM | POA: Insufficient documentation

## 2013-03-31 DIAGNOSIS — R102 Pelvic and perineal pain: Secondary | ICD-10-CM

## 2013-03-31 DIAGNOSIS — Z3490 Encounter for supervision of normal pregnancy, unspecified, unspecified trimester: Secondary | ICD-10-CM

## 2013-03-31 DIAGNOSIS — R000 Tachycardia, unspecified: Secondary | ICD-10-CM | POA: Insufficient documentation

## 2013-03-31 DIAGNOSIS — Z792 Long term (current) use of antibiotics: Secondary | ICD-10-CM | POA: Insufficient documentation

## 2013-03-31 DIAGNOSIS — Z8543 Personal history of malignant neoplasm of ovary: Secondary | ICD-10-CM | POA: Insufficient documentation

## 2013-03-31 DIAGNOSIS — Z8742 Personal history of other diseases of the female genital tract: Secondary | ICD-10-CM | POA: Insufficient documentation

## 2013-03-31 DIAGNOSIS — D259 Leiomyoma of uterus, unspecified: Secondary | ICD-10-CM | POA: Insufficient documentation

## 2013-03-31 DIAGNOSIS — Z79899 Other long term (current) drug therapy: Secondary | ICD-10-CM | POA: Insufficient documentation

## 2013-03-31 DIAGNOSIS — O9989 Other specified diseases and conditions complicating pregnancy, childbirth and the puerperium: Secondary | ICD-10-CM | POA: Insufficient documentation

## 2013-03-31 DIAGNOSIS — Z862 Personal history of diseases of the blood and blood-forming organs and certain disorders involving the immune mechanism: Secondary | ICD-10-CM | POA: Insufficient documentation

## 2013-03-31 LAB — CBC
HCT: 39.3 % (ref 36.0–46.0)
Hemoglobin: 13.3 g/dL (ref 12.0–15.0)
MCH: 30.5 pg (ref 26.0–34.0)
MCHC: 33.8 g/dL (ref 30.0–36.0)
MCV: 90.1 fL (ref 78.0–100.0)
PLATELETS: 363 10*3/uL (ref 150–400)
RBC: 4.36 MIL/uL (ref 3.87–5.11)
RDW: 12.9 % (ref 11.5–15.5)
WBC: 7.8 10*3/uL (ref 4.0–10.5)

## 2013-03-31 LAB — I-STAT CHEM 8, ED
BUN: 7 mg/dL (ref 6–23)
CALCIUM ION: 1.15 mmol/L (ref 1.12–1.23)
CHLORIDE: 104 meq/L (ref 96–112)
Creatinine, Ser: 0.7 mg/dL (ref 0.50–1.10)
Glucose, Bld: 98 mg/dL (ref 70–99)
HEMATOCRIT: 43 % (ref 36.0–46.0)
Hemoglobin: 14.6 g/dL (ref 12.0–15.0)
POTASSIUM: 3.7 meq/L (ref 3.7–5.3)
SODIUM: 139 meq/L (ref 137–147)
TCO2: 24 mmol/L (ref 0–100)

## 2013-03-31 LAB — ABO/RH: ABO/RH(D): A POS

## 2013-03-31 LAB — POC URINE PREG, ED: PREG TEST UR: POSITIVE — AB

## 2013-03-31 MED ORDER — SODIUM CHLORIDE 0.9 % IV SOLN
INTRAVENOUS | Status: DC
  Administered 2013-03-31: via INTRAVENOUS

## 2013-03-31 NOTE — ED Notes (Signed)
Pt st's she started having a ripping type pain in her LLQ this am and continues to get worse.  Pt st's she is preg. But does not know how far along she is.  Pt denies any bleeding.

## 2013-03-31 NOTE — ED Provider Notes (Signed)
CSN: [id number]     Arrival date & time 03/31/13  2208 History   First MD Initiated Contact with Patient 03/31/13 2310     Chief Complaint  Patient presents with  . Abdominal Pain     (Consider location/radiation/quality/duration/timing/severity/associated sxs/prior Treatment) HPI History per patient. Left-sided pelvic pain onset this morning, ripping in quality progressively worsening and now moderate to severe pain. LMP "sometime in December" has a 28-month-old child at home, states she hasn't really . Attention and was not anticipating being pregnant. G2P1. Has scheduled an appointment with OB/GYN, but has not established yet. States she is no longer seeing OB/GYN from her previous pregnancy.  She denies any vaginal bleeding. No back pain. Pain radiates to left lower quadrant into the pelvic region. No dysuria, urgency, frequency, hematuria. No history of same. No associated nausea vomiting or diarrhea. She did not receive RhoGAM with her previous pregnancy.  Past Medical History  Diagnosis Date  . Abnormal Pap smear   . Fibroids   . Endometriosis   . Gout   . Ovarian cancer   . Asthma     no episodes/no inhaler needed   Past Surgical History  Procedure Laterality Date  . Ovarian fibroids    . Knee surgery    . No past surgeries    . Cryotherapy    . Diagnostic laparoscopy    . Cesarean section N/A 09/20/2012    Procedure: PRIMARY CESAREAN SECTION;  Surgeon: Margarette Asal, MD;  Location: [facility];  Service: Obstetrics;  Laterality: N/A;  Primary edc 8/25   No family history on file. History  Substance Use Topics  . Smoking status: Former Smoker    Quit date: 03/27/2002  . Smokeless tobacco: Never Used  . Alcohol Use: No   OB History   Grav Para Term Preterm Abortions TAB SAB Ect Mult Living   2 1 1       1      Review of Systems  Constitutional: Negative for fever and chills.  Respiratory: Negative for shortness of breath.   Cardiovascular: Negative for chest pain.   Gastrointestinal: Negative for vomiting and abdominal pain.  Genitourinary: Positive for pelvic pain. Negative for dysuria and vaginal bleeding.  Musculoskeletal: Negative for back pain.  Skin: Negative for rash.  Neurological: Negative for headaches.  All other systems reviewed and are negative.      Allergies  Penicillins; Sulfa antibiotics; Aspirin; and Coconut flavor  Home Medications   Current Outpatient Rx  Name  Route  Sig  Dispense  Refill  . clindamycin (CLEOCIN) 150 MG capsule   Oral   Take 3 capsules (450 mg total) by mouth 3 (three) times daily.   90 capsule   0   . guaiFENesin (MUCINEX) 600 MG 12 hr tablet   Oral   Take 1 tablet (600 mg total) by mouth 2 (two) times daily.   30 tablet   0   . pseudoephedrine (SUDAFED) 30 MG tablet   Oral   Take 1 tablet (30 mg total) by mouth every 4 (four) hours as needed for congestion.   30 tablet   0    BP 132/68  Pulse 98  Temp(Src) 98.2 F (36.8 C) (Oral)  Resp 18  Wt 201 lb 2 oz (91.23 kg)  SpO2 100%  LMP 01/09/2013 Physical Exam  Constitutional: She is oriented to person, place, and time. She appears well-developed and well-nourished.  HENT:  Head: Normocephalic and atraumatic.  Eyes: EOM are normal. Pupils are  equal, round, and reactive to light.  Neck: Neck supple.  Cardiovascular: Regular rhythm and intact distal pulses.   Tachycardic  Pulmonary/Chest: Effort normal and breath sounds normal. No respiratory distress. She exhibits no tenderness.  Abdominal: Soft. She exhibits no distension. There is no rebound and no guarding.  Tender to palpation left lower quadrant  Musculoskeletal: Normal range of motion. She exhibits no edema.  Neurological: She is alert and oriented to person, place, and time.  Skin: Skin is warm and dry.    ED Course  Procedures (including critical care time) Labs Review Labs Reviewed  HCG, QUANTITATIVE, PREGNANCY - Abnormal; Notable for the following:    hCG, Beta Chain,  Quant, S 7572 (*)    All other components within normal limits  POC URINE PREG, ED - Abnormal; Notable for the following:    Preg Test, Ur POSITIVE (*)    All other components within normal limits  CBC  I-STAT CHEM 8, ED  ABO/RH   Imaging Review US Ob Comp Less 14 Wks  04/01/2013   CLINICAL DATA:  Left-sided pelvic pain.  EXAM: OBSTETRIC <14 WK [place] AND TRANSVAGINAL OB US  TECHNIQUE: Both transabdominal and transvaginal ultrasound examinations were performed for complete evaluation of the gestation as well as the maternal uterus, adnexal regions, and pelvic cul-de-sac. Transvaginal technique was performed to assess early pregnancy.  COMPARISON:  Pelvic ultrasound performed 08/05/2012  FINDINGS: Intrauterine gestational sac: Visualized/normal in shape.  Yolk sac:  Yes  Embryo:  Yes  Cardiac Activity: Yes  Heart Rate: 119 bpm  MSD: 16.8 mm   6 w   4  d  Maternal uterus/adnexae: No subchorionic hemorrhage is noted. Multiple uterine fibroids are seen, measuring up to 4.9 x 3.6 x 3.0 cm in size.  The ovaries are unremarkable in appearance. The right ovary measures 3.9 x 2.1 x 3.2 cm, while the left ovary measures 3.1 x 1.9 x 2.5 cm. No suspicious adnexal masses are seen; there is no evidence for ovarian torsion.  No free fluid is seen within the pelvic cul-de-sac.  IMPRESSION: 1. Single live intrauterine pregnancy noted, with a mean sac diameter of 1.7 cm, corresponding to a gestational age of [redacted] weeks 4 days. This does not match the gestational age by LMP, and reflects an estimated date of delivery of November 21, 2013. 2. Multiple uterine fibroids noted, measuring up to 4.9 cm in size.   Electronically Signed   By: Garald Balding M.D.   On: 04/01/2013 02:54   US Ob Transvaginal  04/01/2013   CLINICAL DATA:  Left-sided pelvic pain.  EXAM: OBSTETRIC <14 WK [place] AND TRANSVAGINAL OB US  TECHNIQUE: Both transabdominal and transvaginal ultrasound examinations were performed for complete evaluation of the gestation as  well as the maternal uterus, adnexal regions, and pelvic cul-de-sac. Transvaginal technique was performed to assess early pregnancy.  COMPARISON:  Pelvic ultrasound performed 08/05/2012  FINDINGS: Intrauterine gestational sac: Visualized/normal in shape.  Yolk sac:  Yes  Embryo:  Yes  Cardiac Activity: Yes  Heart Rate: 119 bpm  MSD: 16.8 mm   6 w   4  d  Maternal uterus/adnexae: No subchorionic hemorrhage is noted. Multiple uterine fibroids are seen, measuring up to 4.9 x 3.6 x 3.0 cm in size.  The ovaries are unremarkable in appearance. The right ovary measures 3.9 x 2.1 x 3.2 cm, while the left ovary measures 3.1 x 1.9 x 2.5 cm. No suspicious adnexal masses are seen; there is no evidence for ovarian torsion.  No free fluid is seen within the pelvic cul-de-sac.  IMPRESSION: 1. Single live intrauterine pregnancy noted, with a mean sac diameter of 1.7 cm, corresponding to a gestational age of [redacted] weeks 4 days. This does not match the gestational age by LMP, and reflects an estimated date of delivery of November 21, 2013. 2. Multiple uterine fibroids noted, measuring up to 4.9 cm in size.   Electronically Signed   By: Garald Balding M.D.   On: 04/01/2013 02:54    After patient evaluated, ultrasound ordered to evaluate for possible ectopic Patient initially requesting nothing stronger than Tylenol for pain  1:38 AM requesting some medication for worsening symptoms. She states understanding possible risk of narcotic pain medications and wishes to have stronger pain medications.   3:35 AM on recheck, pain resolved. Her abdominal exam is soft, nontender nondistended. Patient denies any hematuria, dysuria/ UTI symptoms. She is aware of her uterine fibroids. She still has prenatal vitamins and will take those daily. She has called to schedule followup with OB/GYN, but has not yet gotten appointment. She will call again tomorrow to get a more urgent appointment. Strict return precautions provided and verbalized as  understood. Tachycardia resolved.  MDM   Diagnosis: Pelvic pain, incidental finding pregnancy, uterine fibroids, history of endometriosis.  Evaluated with labs, and ultrasound reviewed as above. IUP 6 weeks 4 days, no ectopic. Improved with medications provided Vital signs and nursing notes reviewed and considered   Teressa Lower, MD 04/01/13 [phone]

## 2013-04-01 ENCOUNTER — Emergency Department (HOSPITAL_COMMUNITY): Payer: BC Managed Care – PPO

## 2013-04-01 LAB — HCG, QUANTITATIVE, PREGNANCY: HCG, BETA CHAIN, QUANT, S: 7572 m[IU]/mL — AB (ref <5)

## 2013-04-01 MED ORDER — ONDANSETRON HCL 4 MG/2ML IJ SOLN
4.0000 mg | Freq: Once | INTRAMUSCULAR | Status: AC
  Administered 2013-04-01: 4 mg via INTRAVENOUS
  Filled 2013-04-01: qty 2

## 2013-04-01 MED ORDER — FENTANYL CITRATE 0.05 MG/ML IJ SOLN
50.0000 ug | INTRAMUSCULAR | Status: DC | PRN
  Administered 2013-04-01: 50 ug via INTRAVENOUS
  Filled 2013-04-01: qty 2

## 2013-04-01 MED ORDER — PROMETHAZINE HCL 25 MG PO TABS: 25.0000 mg | ORAL_TABLET | Freq: Four times a day (QID) | ORAL | Status: DC | PRN

## 2013-04-01 NOTE — ED Notes (Signed)
Dr. Opitz at bedside. 

## 2013-04-01 NOTE — ED Notes (Signed)
Ultrasound, Renin called to inquire about pt will be taken for [place]. States there are 2 pts ahead of this pt.

## 2013-04-01 NOTE — Discharge Instructions (Signed)
Abdominal Pain During Pregnancy °Abdominal pain is common in pregnancy. Most of the time, it does not cause harm. There are many causes of abdominal pain. Some causes are more serious than others. Some of the causes of abdominal pain in pregnancy are easily diagnosed. Occasionally, the diagnosis takes time to understand. Other times, the cause is not determined. Abdominal pain can be a sign that something is very wrong with the pregnancy, or the pain may have nothing to do with the pregnancy at all. For this reason, always tell your health care provider if you have any abdominal discomfort. °HOME CARE INSTRUCTIONS  °Monitor your abdominal pain for any changes. The following actions may help to alleviate any discomfort you are experiencing: °· Do not have sexual intercourse or put anything in your vagina until your symptoms go away completely. °· Get plenty of rest until your pain improves. °· Drink clear fluids if you feel nauseous. Avoid solid food as long as you are uncomfortable or nauseous. °· Only take over-the-counter or prescription medicine as directed by your health care provider. °· Keep all follow-up appointments with your health care provider. °SEEK IMMEDIATE MEDICAL CARE IF: °· You are bleeding, leaking fluid, or passing tissue from the vagina. °· You have increasing pain or cramping. °· You have persistent vomiting. °· You have painful or bloody urination. °· You have a fever. °· You notice a decrease in your baby's movements. °· You have extreme weakness or feel faint. °· You have shortness of breath, with or without abdominal pain. °· You develop a severe headache with abdominal pain. °· You have abnormal vaginal discharge with abdominal pain. °· You have persistent diarrhea. °· You have abdominal pain that continues even after rest, or gets worse. °MAKE SURE YOU:  °· Understand these instructions. °· Will watch your condition. °· Will get help right away if you are not doing well or get  worse. °Document Released: 01/20/2005 Document Revised: 11/10/2012 Document Reviewed: 08/19/2012 °ExitCare® Patient Information ©2014 [employer]. ° °

## 2013-04-05 ENCOUNTER — Ambulatory Visit (HOSPITAL_COMMUNITY): Payer: BC Managed Care – PPO | Attending: Advanced Practice Midwife

## 2013-04-05 NOTE — MAU Provider Note (Signed)
Attestation of Attending Supervision of Advanced Practitioner (CNM/NP): Evaluation and management procedures were performed by the Advanced Practitioner under my supervision and collaboration. I have reviewed the Advanced Practitioner's note and chart, and I agree with the management and plan.  Geovanie Winnett H. 10:36 PM

## 2013-04-07 ENCOUNTER — Inpatient Hospital Stay (HOSPITAL_COMMUNITY)
Admission: AD | Admit: 2013-04-07 | Discharge: 2013-04-07 | Disposition: A | Discharge status: Home / Self Care | Payer: BC Managed Care – PPO | Source: Ambulatory Visit | Attending: Obstetrics and Gynecology | Admitting: Obstetrics and Gynecology

## 2013-04-07 ENCOUNTER — Encounter (HOSPITAL_COMMUNITY): Payer: Self-pay

## 2013-04-07 DIAGNOSIS — O039 Complete or unspecified spontaneous abortion without complication: Secondary | ICD-10-CM | POA: Insufficient documentation

## 2013-04-07 DIAGNOSIS — Z87891 Personal history of nicotine dependence: Secondary | ICD-10-CM | POA: Insufficient documentation

## 2013-04-07 DIAGNOSIS — R109 Unspecified abdominal pain: Secondary | ICD-10-CM | POA: Insufficient documentation

## 2013-04-07 LAB — HCG, QUANTITATIVE, PREGNANCY: HCG, BETA CHAIN, QUANT, S: 4158 m[IU]/mL — AB (ref <5)

## 2013-04-07 LAB — CBC
HCT: 39.5 % (ref 36.0–46.0)
HEMOGLOBIN: 13.2 g/dL (ref 12.0–15.0)
MCH: 29.9 pg (ref 26.0–34.0)
MCHC: 33.4 g/dL (ref 30.0–36.0)
MCV: 89.4 fL (ref 78.0–100.0)
Platelets: 350 10*3/uL (ref 150–400)
RBC: 4.42 MIL/uL (ref 3.87–5.11)
RDW: 12.7 % (ref 11.5–15.5)
WBC: 6.9 10*3/uL (ref 4.0–10.5)

## 2013-04-07 NOTE — MAU Note (Signed)
Patient states she was seen at [facility] ED on 2-27 for a complete evaluation. States she has had some bleeding and pain and was seen in the office yesterday, had an ultrasound and a BHCG. Patient does not know the BHCG results. States she was told by ultrasound there was no heartbeat and "it did not look good". Was instructed to return on Monday to the office. States she continues to have cramping and slight brown vaginal discharge.

## 2013-04-07 NOTE — MAU Provider Note (Signed)
History     CSN: [id number]  Arrival date and time: 04/07/13 1810   First Provider Initiated Contact with Patient 04/07/13 1902      Chief Complaint  Patient presents with  . Abdominal Cramping  . Vaginal Discharge   HPI  Paula Cervantes is a 38 y.o. G2P1001 at [redacted]w[redacted]d who presents today because she thinks that she may be having a miscarriage. She states that she was seen in the office yesterday and had an ultrasound. The tech told her that she could not see a heartbeat. She also saw the NP yesterday, and the plan was to wait until Monday for repeat blood work to see what was going on. She states that she is having brown spotting, and 8/10 cramping. She has not taken anything for the pain at this time. She states that she "just wants to know what is going on. I think I am having a miscarriage, but needs to know"  Past Medical History  Diagnosis Date  . Abnormal Pap smear   . Fibroids   . Endometriosis   . Gout   . Ovarian cancer   . Asthma     no episodes/no inhaler needed    Past Surgical History  Procedure Laterality Date  . Ovarian fibroids    . Knee surgery    . No past surgeries    . Cryotherapy    . Diagnostic laparoscopy    . Cesarean section N/A 09/20/2012    Procedure: PRIMARY CESAREAN SECTION;  Surgeon: Margarette Asal, MD;  Location: [facility];  Service: Obstetrics;  Laterality: N/A;  Primary edc 8/25    History reviewed. No pertinent family history.  History  Substance Use Topics  . Smoking status: Former Smoker    Quit date: 03/27/2002  . Smokeless tobacco: Never Used  . Alcohol Use: No    Allergies:  Allergies  Allergen Reactions  . Penicillins Nausea And Vomiting and Other (See Comments)    Caused patient to pass out.   . Sulfa Antibiotics Other (See Comments)    Blisters in mouth and throat  . Aspirin Hives    Pt states she can take Ibuprofen  . Coconut Flavor Hives    Prescriptions prior to admission  Medication Sig Dispense Refill  .  guaiFENesin (MUCINEX) 600 MG 12 hr tablet Take 1 tablet (600 mg total) by mouth 2 (two) times daily.  30 tablet  0  . promethazine (PHENERGAN) 25 MG tablet Take 1 tablet (25 mg total) by mouth every 6 (six) hours as needed for nausea or vomiting.  30 tablet  0  . pseudoephedrine (SUDAFED) 30 MG tablet Take 1 tablet (30 mg total) by mouth every 4 (four) hours as needed for congestion.  30 tablet  0    ROS Physical Exam   Blood pressure 128/67, pulse 103, temperature 98.5 F (36.9 C), temperature source Oral, resp. rate 16, height 5\' 6"  (1.676 m), weight 91.173 kg (201 lb), last menstrual period 01/09/2013, SpO2 100.00%, not currently breastfeeding.  Physical Exam  Nursing note and vitals reviewed. Constitutional: She is oriented to person, place, and time. She appears well-developed and well-nourished. No distress.  Cardiovascular: Normal rate.   Respiratory: Effort normal.  GI: Soft. There is no tenderness.  Neurological: She is alert and oriented to person, place, and time.  Skin: Skin is warm and dry.  Psychiatric: She has a normal mood and affect.    MAU Course  Procedures  Results for AUDRENA, TALAGA (MRN [id number]) as  of 04/07/2013 19:01  Ref. Range 03/31/2013 23:35 03/31/2013 23:48 04/01/2013 02:38 04/07/2013 18:29  hCG, Beta Chain, Quant, S Latest Range: <5 mIU/mL 7572 (H)   4158 (H)   D/W Dr. Willis Modena: he reviewed chart from the office. Patient will FU with the office as planned on Monday.  Assessment and Plan   1. SAB (spontaneous abortion)    Bleeding precautions Return to MAU as needed or call the on-call MD with any concerns  Follow-up Information   Follow up with MEISINGER,TODD D, MD. (As scheduled for [facility] )    Specialty:  Obstetrics and Gynecology   Contact information:   [address], [address] [place] [address] [phone]        Mathis Bud 04/07/2013, 7:07 PM

## 2013-04-07 NOTE — Discharge Instructions (Signed)
Miscarriage A miscarriage is the sudden loss of an unborn baby (fetus) before the 20th week of pregnancy. Most miscarriages happen in the first 3 months of pregnancy. Sometimes, it happens before a woman even knows she is pregnant. A miscarriage is also called a "spontaneous miscarriage" or "early pregnancy loss." Having a miscarriage can be an emotional experience. Talk with your caregiver about any questions you may have about miscarrying, the grieving process, and your future pregnancy plans. CAUSES   Problems with the fetal chromosomes that make it impossible for the baby to develop normally. Problems with the baby's genes or chromosomes are most often the result of errors that occur, by chance, as the embryo divides and grows. The problems are not inherited from the parents.  Infection of the cervix or uterus.   Hormone problems.   Problems with the cervix, such as having an incompetent cervix. This is when the tissue in the cervix is not strong enough to hold the pregnancy.   Problems with the uterus, such as an abnormally shaped uterus, uterine fibroids, or congenital abnormalities.   Certain medical conditions.   Smoking, drinking alcohol, or taking illegal drugs.   Trauma.  Often, the cause of a miscarriage is unknown.  SYMPTOMS   Vaginal bleeding or spotting, with or without cramps or pain.  Pain or cramping in the abdomen or lower back.  Passing fluid, tissue, or blood clots from the vagina. DIAGNOSIS  Your caregiver will perform a physical exam. You may also have an ultrasound to confirm the miscarriage. Blood or urine tests may also be ordered. TREATMENT   Sometimes, treatment is not necessary if you naturally pass all the fetal tissue that was in the uterus. If some of the fetus or placenta remains in the body (incomplete miscarriage), tissue left behind may become infected and must be removed. Usually, a dilation and curettage (D and C) procedure is performed.  During a D and C procedure, the cervix is widened (dilated) and any remaining fetal or placental tissue is gently removed from the uterus.  Antibiotic medicines are prescribed if there is an infection. Other medicines may be given to reduce the size of the uterus (contract) if there is a lot of bleeding.  If you have Rh negative blood and your baby was Rh positive, you will need a Rh immunoglobulin shot. This shot will protect any future baby from having Rh blood problems in future pregnancies. HOME CARE INSTRUCTIONS   Your caregiver may order bed rest or may allow you to continue light activity. Resume activity as directed by your caregiver.  Have someone help with home and family responsibilities during this time.   Keep track of the number of sanitary pads you use each day and how soaked (saturated) they are. Write down this information.   Do not use tampons. Do not douche or have sexual intercourse until approved by your caregiver.   Only take over-the-counter or prescription medicines for pain or discomfort as directed by your caregiver.   Do not take aspirin. Aspirin can cause bleeding.   Keep all follow-up appointments with your caregiver.   If you or your partner have problems with grieving, talk to your caregiver or seek counseling to help cope with the pregnancy loss. Allow enough time to grieve before trying to get pregnant again.  SEEK IMMEDIATE MEDICAL CARE IF:   You have severe cramps or pain in your back or abdomen.  You have a fever.  You pass large blood clots (walnut-sized  or larger) ortissue from your vagina. Save any tissue for your caregiver to inspect.   Your bleeding increases.   You have a thick, bad-smelling vaginal discharge.  You become lightheaded, weak, or you faint.   You have chills.  MAKE SURE YOU:  Understand these instructions.  Will watch your condition.  Will get help right away if you are not doing well or get  worse. Document Released: 07/16/2000 Document Revised: 05/17/2012 Document Reviewed: 03/11/2011 ExitCare Patient Information 2014 [employer].  

## 2013-04-07 NOTE — MAU Note (Signed)
Pt states she has had some bleeding off and on between 04/06/13 - 04/07/13. Pt states she has had some dark brown  Bleeding. Pt also states she some cramping as well.

## 2013-04-07 NOTE — Progress Notes (Signed)
Pt states she has headaches from  Crying, pt states she wouldn't even take tylenol because she was scared

## 2013-04-09 ENCOUNTER — Encounter (HOSPITAL_COMMUNITY): Payer: Self-pay | Admitting: *Deleted

## 2013-04-09 ENCOUNTER — Inpatient Hospital Stay (HOSPITAL_COMMUNITY)
Admission: AD | Admit: 2013-04-09 | Discharge: 2013-04-10 | Disposition: A | Discharge status: Home / Self Care | Payer: BC Managed Care – PPO | Source: Ambulatory Visit | Attending: Obstetrics and Gynecology | Admitting: Obstetrics and Gynecology

## 2013-04-09 ENCOUNTER — Inpatient Hospital Stay (HOSPITAL_COMMUNITY): Payer: BC Managed Care – PPO

## 2013-04-09 DIAGNOSIS — O039 Complete or unspecified spontaneous abortion without complication: Secondary | ICD-10-CM | POA: Insufficient documentation

## 2013-04-09 DIAGNOSIS — Z87891 Personal history of nicotine dependence: Secondary | ICD-10-CM | POA: Insufficient documentation

## 2013-04-09 DIAGNOSIS — J45909 Unspecified asthma, uncomplicated: Secondary | ICD-10-CM | POA: Insufficient documentation

## 2013-04-09 DIAGNOSIS — R109 Unspecified abdominal pain: Secondary | ICD-10-CM | POA: Insufficient documentation

## 2013-04-09 DIAGNOSIS — R110 Nausea: Secondary | ICD-10-CM | POA: Insufficient documentation

## 2013-04-09 LAB — HCG, QUANTITATIVE, PREGNANCY: HCG, BETA CHAIN, QUANT, S: 1863 m[IU]/mL — AB (ref <5)

## 2013-04-09 MED ORDER — HYDROMORPHONE HCL PF 1 MG/ML IJ SOLN
2.0000 mg | Freq: Once | INTRAMUSCULAR | Status: AC
  Administered 2013-04-09: 2 mg via INTRAVENOUS
  Filled 2013-04-09: qty 2

## 2013-04-09 MED ORDER — KETOROLAC TROMETHAMINE 60 MG/2ML IM SOLN
60.0000 mg | Freq: Once | INTRAMUSCULAR | Status: AC
  Administered 2013-04-09: 60 mg via INTRAMUSCULAR
  Filled 2013-04-09: qty 2

## 2013-04-09 NOTE — MAU Note (Signed)
Patient presents via EMS with complaints of heavy vaginal bleeding and abdominal cramping. Patient states she was seen Thursday and told she had a miscarriage.

## 2013-04-09 NOTE — MAU Provider Note (Signed)
History     CSN: [id number]  Arrival date and time: 04/09/13 2114   First Provider Initiated Contact with Patient 04/09/13 2150      Chief Complaint  Patient presents with  . Miscarriage   HPI Comments: Paula Cervantes 38 y.o. G2P1001 presents to MAU via EMS for bleeding with SAB. She has been in process for several days with light bleeding. Tonight at 8 pm she started bleeding heavily and cramping. She was seen on 04/07/13 and had BHCG  Of 4158.      Past Medical History  Diagnosis Date  . Abnormal Pap smear   . Fibroids   . Endometriosis   . Gout   . Ovarian cancer   . Asthma     no episodes/no inhaler needed    Past Surgical History  Procedure Laterality Date  . Ovarian fibroids    . Knee surgery    . No past surgeries    . Cryotherapy    . Diagnostic laparoscopy    . Cesarean section N/A 09/20/2012    Procedure: PRIMARY CESAREAN SECTION;  Surgeon: Margarette Asal, MD;  Location: [facility];  Service: Obstetrics;  Laterality: N/A;  Primary edc 8/25    History reviewed. No pertinent family history.  History  Substance Use Topics  . Smoking status: Former Smoker    Quit date: 03/27/2002  . Smokeless tobacco: Never Used  . Alcohol Use: No    Allergies:  Allergies  Allergen Reactions  . Penicillins Nausea And Vomiting and Other (See Comments)    Caused patient to pass out.   . Sulfa Antibiotics Other (See Comments)    Blisters in mouth and throat  . Aspirin Hives    Pt states she can take Ibuprofen  . Coconut Flavor Hives    Prescriptions prior to admission  Medication Sig Dispense Refill  . guaiFENesin (MUCINEX) 600 MG 12 hr tablet Take 1 tablet (600 mg total) by mouth 2 (two) times daily.  30 tablet  0  . ibuprofen (ADVIL,MOTRIN) 400 MG tablet Take 400 mg by mouth every 6 (six) hours as needed.      . promethazine (PHENERGAN) 25 MG tablet Take 1 tablet (25 mg total) by mouth every 6 (six) hours as needed for nausea or vomiting.  30 tablet  0  .  pseudoephedrine (SUDAFED) 30 MG tablet Take 1 tablet (30 mg total) by mouth every 4 (four) hours as needed for congestion.  30 tablet  0    Review of Systems  Constitutional: Negative.   HENT: Negative.   Eyes: Negative.   Respiratory: Negative.   Cardiovascular: Negative.   Gastrointestinal: Positive for nausea and abdominal pain.  Genitourinary:       Vaginal bleeding and clots  Musculoskeletal: Negative.   Skin: Negative.   Neurological: Negative.   Psychiatric/Behavioral: Negative.    Physical Exam   Blood pressure 134/75, pulse 108, temperature 98.9 F (37.2 C), temperature source Oral, resp. rate 20, last menstrual period 01/09/2013, SpO2 98.00%, not currently breastfeeding.  Physical Exam  Constitutional: She appears well-developed and well-nourished. She appears distressed.  Arrived EMS with IV line and NS running  HENT:  Head: Normocephalic and atraumatic.  Eyes: Conjunctivae are normal. Pupils are equal, round, and reactive to light.  Cardiovascular: Normal rate and regular rhythm.   Respiratory: Effort normal and breath sounds normal.  GI: Soft. Bowel sounds are normal. There is tenderness.  Genitourinary:  Genital:External bloody Vaginal: Large amount blood with large clots Cervix: 1cm Bimanual:tender  Musculoskeletal: Normal range of motion.  Neurological: She is alert.  Skin: Skin is warm.  Psychiatric: She has a normal mood and affect. Her behavior is normal. Judgment and thought content normal.   Results for orders placed during the hospital encounter of 04/09/13 (from the past 24 hour(s))  HCG, QUANTITATIVE, PREGNANCY     Status: Abnormal   Collection Time    04/09/13 10:20 PM      Result Value Ref Range   hCG, Beta Chain, Quant, S 1863 (*) <5 mIU/mL    US Ob Transvaginal  04/10/2013   CLINICAL DATA:  Spontaneous abortion in progress.  EXAM: TRANSVAGINAL OB ULTRASOUND  TECHNIQUE: Transvaginal ultrasound was performed for complete evaluation of the  gestation as well as the maternal uterus, adnexal regions, and pelvic cul-de-sac.  COMPARISON:  04/01/2013  FINDINGS: The previously seen gestational sac is no longer visible. Endometrial cavity is thickened to 2 cm AP dimension by heterogeneous, predominant echogenic tissue or material that shows no color Doppler flow (to confirm retained products of conception).  Right fundal fibroid measuring 3 cm. The right ovary is normal in size and appearance. The left ovary was not visualized. No free pelvic fluid.  IMPRESSION: 1. Spontaneous abortion. The previously seen gestational sac is no longer visible. 2. Endometrial cavity thickening, which could be blood clot or products of conception. 3. 3 cm intramural fibroid.   Electronically Signed   By: Jorje Guild M.D.   On: 04/10/2013 00:19     MAU Course  Procedures  MDM  Dilaudid 2 mg IV push for pain "10" on 1-10 scale Toradol 60 mg  U/S Quant/ 1863  Pain returned to "6" after ultrasound will repeat Dilaudid Called Dr Ulanda Edison who agreed with plan of care  Assessment and Plan   A: SAB  P: Home with percocet/ phenergan Pelvic rest/ fluids Follow up with Dr Marena Chancy, [place] 04/09/2013, 10:22 PM

## 2013-04-10 MED ORDER — OXYCODONE-ACETAMINOPHEN 5-325 MG PO TABS: 2.0000 | ORAL_TABLET | ORAL | Status: DC | PRN

## 2013-04-10 MED ORDER — HYDROMORPHONE HCL PF 1 MG/ML IJ SOLN
2.0000 mg | Freq: Once | INTRAMUSCULAR | Status: AC
  Administered 2013-04-10: 2 mg via INTRAVENOUS
  Filled 2013-04-10: qty 2

## 2013-04-10 MED ORDER — PROMETHAZINE HCL 25 MG PO TABS: 25.0000 mg | ORAL_TABLET | Freq: Four times a day (QID) | ORAL | Status: DC | PRN

## 2013-04-10 NOTE — Discharge Instructions (Signed)
Miscarriage A miscarriage is the loss of an unborn baby (fetus) before the 20th week of pregnancy. The cause is often unknown.  HOME CARE  You may need to stay in bed (bed rest), or you may be able to do light activity. Go about activity as told by your doctor.  Have help at home.  Write down how many pads you use each day. Write down how soaked they are.  Do not use tampons. Do not wash out your vagina (douche) or have sex (intercourse) until your doctor approves.  Only take medicine as told by your doctor.  Do not take aspirin.  Keep all doctor visits as told.  If you or your partner have problems with grieving, talk to your doctor. You can also try counseling. Give yourself time to grieve before trying to get pregnant again. GET HELP RIGHT AWAY IF:  You have bad cramps or pain in your back or belly (abdomen).  You have a fever.  You pass large clumps of blood (clots) from your vagina that are walnut-sized or larger. Save the clumps for your doctor to see.  You pass large amounts of tissue from your vagina. Save the tissue for your doctor to see.  You have more bleeding.  You have thick, bad-smelling fluid (discharge) coming from the vagina.  You get lightheaded, weak, or you pass out (faint).  You have chills. MAKE SURE YOU:  Understand these instructions.  Will watch your condition.  Will get help right away if you are not doing well or get worse. Document Released: 04/14/2011 Document Reviewed: 04/14/2011 [facility] Patient Information 2014 [place]. Pelvic Rest Pelvic rest is sometimes recommended for women when:   The placenta is partially or completely covering the opening of the cervix (placenta previa).  There is bleeding between the uterine wall and the amniotic sac in the first trimester (subchorionic hemorrhage).  The cervix begins to open without labor starting (incompetent cervix, cervical insufficiency).  The labor is too early (preterm  labor). HOME CARE INSTRUCTIONS  Do not have sexual intercourse, stimulation, or an orgasm.  Do not use tampons, douche, or put anything in the vagina.  Do not lift anything over 10 pounds (4.5 kg).  Avoid strenuous activity or straining your pelvic muscles. SEEK MEDICAL CARE IF:  You have any vaginal bleeding during pregnancy. Treat this as a potential emergency.  You have cramping pain felt low in the stomach (stronger than menstrual cramps).  You notice vaginal discharge (watery, mucus, or bloody).  You have a low, dull backache.  There are regular contractions or uterine tightening. SEEK IMMEDIATE MEDICAL CARE IF: You have vaginal bleeding and have placenta previa.  Document Released: 05/17/2010 Document Revised: 04/14/2011 Document Reviewed: 05/17/2010 [facility] Patient Information 2014 [place].

## 2013-06-11 ENCOUNTER — Emergency Department (INDEPENDENT_AMBULATORY_CARE_PROVIDER_SITE_OTHER)
Admission: EM | Admit: 2013-06-11 | Discharge: 2013-06-11 | Disposition: A | Discharge status: Home / Self Care | Payer: BC Managed Care – PPO | Source: Home / Self Care | Attending: Emergency Medicine | Admitting: Emergency Medicine

## 2013-06-11 ENCOUNTER — Encounter (HOSPITAL_COMMUNITY): Payer: Self-pay | Admitting: Emergency Medicine

## 2013-06-11 DIAGNOSIS — H8110 Benign paroxysmal vertigo, unspecified ear: Secondary | ICD-10-CM

## 2013-06-11 DIAGNOSIS — I10 Essential (primary) hypertension: Secondary | ICD-10-CM

## 2013-06-11 DIAGNOSIS — G44209 Tension-type headache, unspecified, not intractable: Secondary | ICD-10-CM

## 2013-06-11 LAB — POCT I-STAT, CHEM 8
BUN: 5 mg/dL — AB (ref 6–23)
CHLORIDE: 104 meq/L (ref 96–112)
Calcium, Ion: 1.19 mmol/L (ref 1.12–1.23)
Creatinine, Ser: 0.7 mg/dL (ref 0.50–1.10)
Glucose, Bld: 140 mg/dL — ABNORMAL HIGH (ref 70–99)
HCT: 41 % (ref 36.0–46.0)
Hemoglobin: 13.9 g/dL (ref 12.0–15.0)
Potassium: 3.6 mEq/L — ABNORMAL LOW (ref 3.7–5.3)
Sodium: 141 mEq/L (ref 137–147)
TCO2: 22 mmol/L (ref 0–100)

## 2013-06-11 MED ORDER — AMITRIPTYLINE HCL 25 MG PO TABS: 25.0000 mg | ORAL_TABLET | Freq: Every day | ORAL | Status: DC

## 2013-06-11 MED ORDER — MECLIZINE HCL 25 MG PO TABS: 25.0000 mg | ORAL_TABLET | Freq: Three times a day (TID) | ORAL | Status: DC | PRN

## 2013-06-11 NOTE — ED Provider Notes (Signed)
Chief Complaint   Chief Complaint  Patient presents with  . Hypertension  . Dizziness    History of Present Illness   Paula Cervantes is a 38 year old female who has had a one-week history of headache and intermittent vertigo. The pain is bitemporal, constant, and rated 5/10 in intensity. It's not associated with nausea, vomiting, photophobia, or phonophobia. The patient has had migraines before and states this is not like her migraines. She denies any stiff neck, fever, or neurological symptoms. She also has felt dizzy and. She describes spinning of the head. This is particularly worse when she lies flat but can also occurs if she bends over or stands up suddenly. Because of these symptoms she had her blood pressure checked at work today and was found to be 180/120. She's never had high blood pressure before, but there is a family history. She denies any chest pain or shortness of breath. She's had no paresthesias, weakness, or difficulty with speaking or ambulation.  Review of Systems   Other than noted above, the patient denies any of the following symptoms: Systemic:  No fever, chills, fatigue, or weight loss. Eye:  No blurred vision, visual change or diplopia. ENT:  No ear pain, tinnitus, hearing loss, nasal congestion, or rhinorrhea. Cardiac:  No chest pain, dyspnea, palpitations or syncope. Neuro:  No headache, paresthesias, weakness, trouble with speech, coordination or ambulation.  [place]   Past medical history, family history, social history, meds, and allergies were reviewed.  She is allergic to aspirin, penicillin, and sulfa. She has a history of ovarian cancer while she was pregnant, but states that after she had her baby, 8 months ago, there was no sign of the cancer.  Physical Examination     Vital signs:  BP 134/80  Pulse 93  Temp(Src) 98.8 F (37.1 C) (Oral)  Resp 20  SpO2 99%  LMP 05/11/2013  Breastfeeding? Unknown General:  Alert, oriented times 3, in no  distress. Eye:  PERRL, full EOM, no nystagmus. Fundi were benign. No papilledema. ENT:  There is erythema and slight swelling of both ear canals. The TMs are normal. The patient states she's had chronic otitis externa.  Nasal mucosa normal.  Pharynx clear. Neck:  No adenopathy, tenderness, or mass.  Thyroid normal.  No carotid bruit. Lungs:  Breath sounds clear and equal bilaterally.  No wheezes, rales or rhonchi. Heart:  Regular rhythm.  No gallops, murmers, or rubs. Neuro:  Alert and oriented times 3.  Cranial nerves intact.  No pronator drift.  Finger to nose normal.  No focal weakness.  Sensation intact to light touch.  Romberg's sign negative, gait normal.  Able to do tandem gait well.  Dix-Hallpike maneuver was positive bilaterally, but worse on the right side.   Labs   Results for orders placed during the hospital encounter of 06/11/13  POCT I-STAT, CHEM 8      Result Value Ref Range   Sodium 141  137 - 147 mEq/L   Potassium 3.6 (*) 3.7 - 5.3 mEq/L   Chloride 104  96 - 112 mEq/L   BUN 5 (*) 6 - 23 mg/dL   Creatinine, Ser 0.70  0.50 - 1.10 mg/dL   Glucose, Bld 140 (*) 70 - 99 mg/dL   Calcium, Ion 1.19  1.12 - 1.23 mmol/L   TCO2 22  0 - 100 mmol/L   Hemoglobin 13.9  12.0 - 15.0 g/dL   HCT 41.0  36.0 - 46.0 %     Assessment  The primary encounter diagnosis was Tension type headache. Diagnoses of Benign positional vertigo and Hypertension were also pertinent to this visit.  She'll need followup with her primary care physician with regard to the blood pressure. Also for her blood sugar which was 140, at the upper limits of normal.  Plan     1.  Meds:  The following meds were prescribed:   Discharge Medication List as of 06/11/2013  4:53 PM    START taking these medications   Details  amitriptyline (ELAVIL) 25 MG tablet Take 1 tablet (25 mg total) by mouth at bedtime., Starting 06/11/2013, Until Discontinued, Normal    meclizine (ANTIVERT) 25 MG tablet Take 1 tablet (25 mg  total) by mouth 3 (three) times daily as needed for dizziness., Starting 06/11/2013, Until Discontinued, Normal        2.  Patient Education/Counseling:  The patient was given appropriate handouts, self care instructions, and instructed in symptomatic relief.  Instructed on weight loss, exercise, and-diet. Followup with Dr. Mickey Farber within the next 2 weeks.  3.  Follow up:  The patient was told to follow up here if no better in 3 to 4 days, or sooner if becoming worse in any way, and given some red flag symptoms such as new neurological symptoms, chest pain, or syncope which would prompt immediate return.       Harden Mo, MD 06/11/13 [phone]

## 2013-06-11 NOTE — ED Notes (Signed)
C/o dizziness for 1 day last week.  Has had headache Thurs night and Fri. AM.  Took Tylenol with relief. Woke up dizzy this AM.  Had [facility] check her BP @ 12 N and it was 180/120.  C/o dizziness-not room spinning- like she is floating or lightheaded.

## 2013-06-11 NOTE — Discharge Instructions (Signed)
You have been diagnosed with benign positional vertigo.  The cause of this is a displaced particle or crystal in the inner ear.  This can be cured by doing the exercise described below, called the Epley exercise developed by Dr. Horald Chestnut.  Do this exercise three times daily until you are able to do it for 24 hours without getting dizzy.  Sleep with your head elevated and try to avoid bending over.  If the dizziness is not better in 7 days, return here, see your primary care doctor or ENT doctor.     Blood pressure over the ideal can put you at higher risk for stroke, heart disease, and kidney failure.  For this reason, it's important to try to get your blood pressure as close as possible to the ideal.  The ideal blood pressure is 120/80.  Blood pressures from 237-628 systolic over 31-51 diastolic are labeled as "prehypertension."  This means you are at higher risk of developing hypertension in the future.  Blood pressures in this range are not treated with medication, but lifestyle changes are recommended to prevent progression to hypertension.  Blood pressures of 761 and above systolic over 90 and above diastolic are classified as hypertension and are treated with medications.  Lifestyle changes which can benefit both prehypertension and hypertension include the following:   Salt and sodium restriction.  Weight loss.  Regular exercise.  Avoidance of tobacco.  Avoidance of excess alcohol.  The "D.A.S.H" diet.   People with hypertension and prehypertension should limit their salt intake to less than 1500 mg daily.  Reading the nutrition information on the label of many prepared foods can give you an idea of how much sodium you're consuming at each meal.  Remember that the most important number on the nutrition information is the serving size.  It may be smaller than you think.  Try to avoid adding extra salt at the table.  You may add small amounts of salt while cooking.  Remember that salt is  an acquired taste and you may get used to a using a whole lot less salt than you are using now.  Using less salt lets the food's natural flavors come through.  You might want to consider using salt substitutes, potassium chloride, pepper, or blends of herbs and spices to enhance the flavor of your food.  Foods that contain the most salt include: processed meats (like ham, bacon, lunch meat, sausage, hot dogs, and breakfast meat), chips, pretzels, salted nuts, soups, salty snacks, canned foods, junk food, fast food, restaurant food, mustard, pickles, pizza, popcorn, soy sauce, and worcestershire sauce--quite a list!  You might ask, "Is there anything I can eat?"  The answer is, "yes."  Fruits and vegetables are usually low in salt.  Fresh is better than frozen which is better than canned.  If you have canned vegetables, you can cut down on the salt content by rinsing them in tap water 3 times before cooking.     Weight loss is the second thing you can do to lower your blood pressure.  Getting to and maintaining ideal weight will often normalize your blood pressure and allow you to avoid medications, entirely, cut way down on your dosage of medications, or allow to wean off your meds.  (Note, this should only be done under the supervision of your primary care doctor.)  Of course, weight loss takes time and you may need to be on medication in the meantime.  You shoot for a body mass  index of 20-25.  When you go to the urgent care or to your primary care doctor, they should calculate your BMI.  If you don't know what it is, ask.  You can calculate your BMI with the following formula:  Weight in pounds x 703/ (height in inches) x (height in inches).  There are many good diets out there: Weight Watchers and the D.A.S.H. Diet are the best, but often, just modifying a few factors can be helpful:  Don't skip meals, don't eat out, and keeping a food diary.  I do not recommend fad diets or diet pills which often raise blood  pressure.    Everyone should get regular exercise, but this is particularly important for people with high blood pressure.  Just about any exercise is good.  The only exercise which may be harmful is lifting extreme heavy weights.  I recommend moderate exercise such as walking for 30 minutes 5 days a week.  Going to the gym for a 50 minute workout 3 times a week is also good.  This amounts to 150 minutes of exercise weekly.   Anyone with high blood pressure should avoid any use of tobacco.  Tobacco use does not elevate blood pressure, but it increases the risk of heart disease and stroke.  If you are interested in quitting, discuss with your doctor how to quit.  If you are not interested in quitting, ask yourself, "What would my life be like in 10 years if I continue to smoke?"  "How will I know when it is time to quit?"  "How would my life be better if I were to quit."   Excess alcohol intake can raise the blood pressure.  The safe alcohol intake is 2 drinks or less per day for men and 1 drink per day or less for women.   There is a very good diet which I recommend that has been designed for people with blood pressure called the D.A.S.H. Diet (dietary approaches to stop hypertension).  It consists of fruits, vegetables, lean meats, low fat dairy, whole grains, nuts and seeds.  It is very low in salt and sodium.  It has also been found to have other beneficial health effects such as lowering cholesterol and helping lose weight.  It has been developed by the [employer] and can be downloaded from the internet without any cost. Just do a Development worker, community on "D.A.S.H. Diet." or go the NIH website (MasterBoxes.it).  There are also cookbooks and diet plans that can be gotten from [place] to help you with this diet.   DASH Diet The DASH diet stands for "Dietary Approaches to Stop Hypertension." It is a healthy eating plan that has been shown to reduce high blood pressure (hypertension) in as little  as 14 days, while also possibly providing other significant health benefits. These other health benefits include reducing the risk of breast cancer after menopause and reducing the risk of type 2 diabetes, heart disease, colon cancer, and stroke. Health benefits also include weight loss and slowing kidney failure in patients with chronic kidney disease.  DIET GUIDELINES  Limit salt (sodium). Your diet should contain less than 1500 mg of sodium daily.  Limit refined or processed carbohydrates. Your diet should include mostly whole grains. Desserts and added sugars should be used sparingly.  Include small amounts of heart-healthy fats. These types of fats include nuts, oils, and tub margarine. Limit saturated and trans fats. These fats have been shown to be harmful  in the body. CHOOSING FOODS  The following food groups are based on a 2000 calorie diet. See your Registered Dietitian for individual calorie needs. Grains and Grain Products (6 to 8 servings daily)  Eat More Often: Whole-wheat bread, brown rice, whole-grain or wheat pasta, quinoa, popcorn without added fat or salt (air popped).  Eat Less Often: White bread, white pasta, white rice, cornbread. Vegetables (4 to 5 servings daily)  Eat More Often: Fresh, frozen, and canned vegetables. Vegetables may be raw, steamed, roasted, or grilled with a minimal amount of fat.  Eat Less Often/Avoid: Creamed or fried vegetables. Vegetables in a cheese sauce. Fruit (4 to 5 servings daily)  Eat More Often: All fresh, canned (in natural juice), or frozen fruits. Dried fruits without added sugar. One hundred percent fruit juice ( cup [237 mL] daily).  Eat Less Often: Dried fruits with added sugar. Canned fruit in light or heavy syrup. [employer], Fish, and Poultry (2 servings or less daily. One serving is 3 to 4 oz [85-114 g]).  Eat More Often: Ninety percent or leaner ground beef, tenderloin, sirloin. Round cuts of beef, chicken breast, [place]  breast. All fish. Grill, bake, or broil your meat. Nothing should be fried.  Eat Less Often/Avoid: Fatty cuts of meat, [place], or chicken leg, thigh, or wing. Fried cuts of meat or fish. Dairy (2 to 3 servings)  Eat More Often: Low-fat or fat-free milk, low-fat plain or light yogurt, reduced-fat or part-skim cheese.  Eat Less Often/Avoid: Milk (whole, 2%).Whole milk yogurt. Full-fat cheeses. Nuts, Seeds, and Legumes (4 to 5 servings per week)  Eat More Often: All without added salt.  Eat Less Often/Avoid: Salted nuts and seeds, canned beans with added salt. Fats and Sweets (limited)  Eat More Often: Vegetable oils, tub margarines without trans fats, sugar-free gelatin. Mayonnaise and salad dressings.  Eat Less Often/Avoid: Coconut oils, palm oils, butter, stick margarine, cream, half and half, cookies, candy, pie. FOR MORE INFORMATION The Dash Diet Eating Plan: www.dashdiet.org Document Released: 01/09/2011 Document Revised: 04/14/2011 Document Reviewed: 01/09/2011 [facility] Patient Information 2014 [place], [place].

## 2013-06-19 ENCOUNTER — Encounter (HOSPITAL_COMMUNITY): Payer: Self-pay | Admitting: Advanced Practice Midwife

## 2013-06-19 ENCOUNTER — Inpatient Hospital Stay (HOSPITAL_COMMUNITY): Payer: BC Managed Care – PPO

## 2013-06-19 ENCOUNTER — Inpatient Hospital Stay (HOSPITAL_COMMUNITY)
Admission: AD | Admit: 2013-06-19 | Discharge: 2013-06-20 | Disposition: A | Discharge status: Home / Self Care | Payer: BC Managed Care – PPO | Source: Ambulatory Visit | Attending: Obstetrics and Gynecology | Admitting: Obstetrics and Gynecology

## 2013-06-19 DIAGNOSIS — O99891 Other specified diseases and conditions complicating pregnancy: Secondary | ICD-10-CM | POA: Insufficient documentation

## 2013-06-19 DIAGNOSIS — O26899 Other specified pregnancy related conditions, unspecified trimester: Secondary | ICD-10-CM

## 2013-06-19 DIAGNOSIS — O9989 Other specified diseases and conditions complicating pregnancy, childbirth and the puerperium: Secondary | ICD-10-CM

## 2013-06-19 DIAGNOSIS — Z87891 Personal history of nicotine dependence: Secondary | ICD-10-CM | POA: Insufficient documentation

## 2013-06-19 DIAGNOSIS — R109 Unspecified abdominal pain: Secondary | ICD-10-CM

## 2013-06-19 LAB — CBC
HCT: 38 % (ref 36.0–46.0)
Hemoglobin: 12.5 g/dL (ref 12.0–15.0)
MCH: 29.4 pg (ref 26.0–34.0)
MCHC: 32.9 g/dL (ref 30.0–36.0)
MCV: 89.4 fL (ref 78.0–100.0)
PLATELETS: 327 10*3/uL (ref 150–400)
RBC: 4.25 MIL/uL (ref 3.87–5.11)
RDW: 12.8 % (ref 11.5–15.5)
WBC: 6.4 10*3/uL (ref 4.0–10.5)

## 2013-06-19 LAB — WET PREP, GENITAL
Trich, Wet Prep: NONE SEEN
YEAST WET PREP: NONE SEEN

## 2013-06-19 LAB — URINALYSIS, ROUTINE W REFLEX MICROSCOPIC
Bilirubin Urine: NEGATIVE
GLUCOSE, UA: NEGATIVE mg/dL
Hgb urine dipstick: NEGATIVE
Ketones, ur: NEGATIVE mg/dL
LEUKOCYTES UA: NEGATIVE
Nitrite: NEGATIVE
PH: 6.5 (ref 5.0–8.0)
Protein, ur: NEGATIVE mg/dL
Specific Gravity, Urine: 1.02 (ref 1.005–1.030)
Urobilinogen, UA: 0.2 mg/dL (ref 0.0–1.0)

## 2013-06-19 LAB — HCG, QUANTITATIVE, PREGNANCY: hCG, Beta Chain, Quant, S: 5248 m[IU]/mL — ABNORMAL HIGH (ref <5)

## 2013-06-19 LAB — POCT PREGNANCY, URINE: PREG TEST UR: POSITIVE — AB

## 2013-06-19 MED ORDER — PROMETHAZINE HCL 25 MG PO TABS: 25.0000 mg | ORAL_TABLET | Freq: Four times a day (QID) | ORAL | Status: DC | PRN

## 2013-06-19 NOTE — Discharge Instructions (Signed)
Abdominal Pain During Pregnancy Abdominal pain is common in pregnancy. Most of the time, it does not cause harm. There are many causes of abdominal pain. Some causes are more serious than others. Some of the causes of abdominal pain in pregnancy are easily diagnosed. Occasionally, the diagnosis takes time to understand. Other times, the cause is not determined. Abdominal pain can be a sign that something is very wrong with the pregnancy, or the pain may have nothing to do with the pregnancy at all. For this reason, always tell your health care provider if you have any abdominal discomfort. HOME CARE INSTRUCTIONS  Monitor your abdominal pain for any changes. The following actions may help to alleviate any discomfort you are experiencing:  Do not have sexual intercourse or put anything in your vagina until your symptoms go away completely.  Get plenty of rest until your pain improves.  Drink clear fluids if you feel nauseous. Avoid solid food as long as you are uncomfortable or nauseous.  Only take over-the-counter or prescription medicine as directed by your health care provider.  Keep all follow-up appointments with your health care provider. SEEK IMMEDIATE MEDICAL CARE IF:  You are bleeding, leaking fluid, or passing tissue from the vagina.  You have increasing pain or cramping.  You have persistent vomiting.  You have painful or bloody urination.  You have a fever.  You notice a decrease in your baby's movements.  You have extreme weakness or feel faint.  You have shortness of breath, with or without abdominal pain.  You develop a severe headache with abdominal pain.  You have abnormal vaginal discharge with abdominal pain.  You have persistent diarrhea.  You have abdominal pain that continues even after rest, or gets worse. MAKE SURE YOU:   Understand these instructions.  Will watch your condition.  Will get help right away if you are not doing well or get  worse. Document Released: 01/20/2005 Document Revised: 11/10/2012 Document Reviewed: 08/19/2012 [facility] Patient Information 2014 [place], [place].  Pregnancy - First Trimester During sexual intercourse, millions of sperm go into the vagina. Only 1 sperm will penetrate and fertilize the female egg while it is in the Fallopian tube. One week later, the fertilized egg implants into the wall of the uterus. An embryo begins to develop into a baby. At 6 to 8 weeks, the eyes and face are formed and the heartbeat can be seen on ultrasound. At the end of 12 weeks (first trimester), all the baby's organs are formed. Now that you are pregnant, you will want to do everything you can to have a healthy baby. Two of the most important things are to get good prenatal care and follow your caregiver's instructions. Prenatal care is all the medical care you receive before the baby's birth. It is given to prevent, find, and treat problems during the pregnancy and childbirth. PRENATAL EXAMS  During prenatal visits, your weight, blood pressure, and urine are checked. This is done to make sure you are healthy and progressing normally during the pregnancy.  A pregnant woman should gain 25 to 35 pounds during the pregnancy. However, if you are overweight or underweight, your caregiver will advise you regarding your weight.  Your caregiver will ask and answer questions for you.  Blood work, cervical cultures, other necessary tests, and a Pap test are done during your prenatal exams. These tests are done to check on your health and the probable health of your baby. Tests are strongly recommended and done for HIV  the virus that causes AIDS. These tests are done because medicines can be given to help prevent your baby from being born with this infection should you have been infected without knowing it. Blood work is also used to find out your blood type, previous infections, and follow your blood levels  (hemoglobin). °Low hemoglobin (anemia) is common during pregnancy. Iron and vitamins are given to help prevent this. Later in the pregnancy, blood tests for diabetes will be done along with any other tests if any problems develop. °You may need other tests to make sure you and the baby are doing well. °CHANGES DURING THE FIRST TRIMESTER  °Your body goes through many changes during pregnancy. They vary from person to person. Talk to your caregiver about changes you notice and are concerned about. Changes can include: °Your menstrual period stops. °The egg and sperm carry the genes that determine what you look like. Genes from you and your partner are forming a baby. The female genes determine whether the baby is a boy or a girl. °Your body increases in girth and you may feel bloated. °Feeling sick to your stomach (nauseous) and throwing up (vomiting). If the vomiting is uncontrollable, call your caregiver. °Your breasts will begin to enlarge and become tender. °Your nipples may stick out more and become darker. °The need to urinate more. Painful urination may mean you have a bladder infection. °Tiring easily. °Loss of appetite. °Cravings for certain kinds of food. °At first, you may gain or lose a couple of pounds. °You may have changes in your emotions from day to day (excited to be pregnant or concerned something may go wrong with the pregnancy and baby). °You may have more vivid and strange dreams. °HOME CARE INSTRUCTIONS  °It is very important to avoid all smoking, alcohol and non-prescribed drugs during your pregnancy. These affect the formation and growth of the baby. Avoid chemicals while pregnant to ensure the delivery of a healthy infant. °Start your prenatal visits by the 12th week of pregnancy. They are usually scheduled monthly at first, then more often in the last 2 months before delivery. Keep your caregiver's appointments. Follow your caregiver's instructions regarding medicine use, blood and lab tests,  exercise, and diet. °During pregnancy, you are providing food for you and your baby. Eat regular, well-balanced meals. Choose foods such as meat, fish, milk and other low fat dairy products, vegetables, fruits, and whole-grain breads and cereals. Your caregiver will tell you of the ideal weight gain. °You can help morning sickness by keeping soda crackers at the bedside. Eat a couple before arising in the morning. You may want to use the crackers without salt on them. °Eating 4 to 5 small meals rather than 3 large meals a day also may help the nausea and vomiting. °Drinking liquids between meals instead of during meals also seems to help nausea and vomiting. °A physical sexual relationship may be continued throughout pregnancy if there are no other problems. Problems may be early (premature) leaking of amniotic fluid from the membranes, vaginal bleeding, or belly (abdominal) pain. °Exercise regularly if there are no restrictions. Check with your caregiver or physical therapist if you are unsure of the safety of some of your exercises. Greater weight gain will occur in the last 2 trimesters of pregnancy. Exercising will help: °Control your weight. °Keep you in shape. °Prepare you for labor and delivery. °Help you lose your pregnancy weight after you deliver your baby. °Wear a good support or jogging bra for breast tenderness   during pregnancy. This may help if worn during sleep too. °Ask when prenatal classes are available. Begin classes when they are offered. °Do not use hot tubs, steam rooms, or saunas. °Wear your seat belt when driving. This protects you and your baby if you are in an accident. °Avoid raw meat, uncooked cheese, cat litter boxes, and soil used by cats throughout the pregnancy. These carry germs that can cause birth defects in the baby. °The first trimester is a good time to visit your dentist for your dental health. Getting your teeth cleaned is okay. Use a softer toothbrush and brush gently during  pregnancy. °Ask for help if you have financial, counseling, or nutritional needs during pregnancy. Your caregiver will be able to offer counseling for these needs as well as refer you for other special needs. °Do not take any medicines or herbs unless told by your caregiver. °Inform your caregiver if there is any mental or physical domestic violence. °Make a list of emergency phone numbers of family, friends, hospital, and police and fire departments. °Write down your questions. Take them to your prenatal visit. °Do not douche. °Do not cross your legs. °If you have to stand for long periods of time, rotate you feet or take small steps in a circle. °You may have more vaginal secretions that may require a sanitary pad. Do not use tampons or scented sanitary pads. °MEDICINES AND DRUG USE IN PREGNANCY °Take prenatal vitamins as directed. The vitamin should contain 1 milligram of folic acid. Keep all vitamins out of reach of children. Only a couple vitamins or tablets containing iron may be fatal to a baby or young child when ingested. °Avoid use of all medicines, including herbs, over-the-counter medicines, not prescribed or suggested by your caregiver. Only take over-the-counter or prescription medicines for pain, discomfort, or fever as directed by your caregiver. Do not use aspirin, ibuprofen, or naproxen unless directed by your caregiver. °Let your caregiver also know about herbs you may be using. °Alcohol is related to a number of birth defects. This includes fetal alcohol syndrome. All alcohol, in any form, should be avoided completely. Smoking will cause low birth rate and premature babies. °Street or illegal drugs are very harmful to the baby. They are absolutely forbidden. A baby born to an addicted mother will be addicted at birth. The baby will go through the same withdrawal an adult does. °Let your caregiver know about any medicines that you have to take and for what reason you take them. °SEEK MEDICAL CARE  IF:  °You have any concerns or worries during your pregnancy. It is better to call with your questions if you feel they cannot wait, rather than worry about them. °SEEK IMMEDIATE MEDICAL CARE IF:  °An unexplained oral temperature above 102° F (38.9° C) develops, or as your caregiver suggests. °You have leaking of fluid from the vagina (birth canal). If leaking membranes are suspected, take your temperature and inform your caregiver of this when you call. °There is vaginal spotting or bleeding. Notify your caregiver of the amount and how many pads are used. °You develop a bad smelling vaginal discharge with a change in the color. °You continue to feel sick to your stomach (nauseated) and have no relief from remedies suggested. You vomit blood or coffee ground-like materials. °You lose more than 2 pounds of weight in 1 week. °You gain more than 2 pounds of weight in 1 week and you notice swelling of your face, hands, feet, or legs. °You gain 5   continue to feel sick to your stomach (nauseated) and have no relief from remedies suggested. You vomit blood or coffee ground-like materials.  You lose more than 2 pounds of weight in 1 week.  You gain more than 2 pounds of weight in 1 week and you notice swelling of your face, hands, feet, or legs.  You gain 5 pounds or more in 1 week (even if you do not have swelling of your hands, face, legs, or feet).  You get exposed to [place] measles and have never had them.  You are exposed to fifth disease or chickenpox.  You develop belly (abdominal) pain. Round ligament discomfort is a common non-cancerous (benign) cause of abdominal pain in pregnancy. Your caregiver still must evaluate this.  You develop headache, fever, diarrhea, pain with urination, or shortness of breath.  You fall or are in a car accident or have any kind of trauma.  There is mental or physical violence in your home. Document Released: 01/14/2001 Document Revised: 10/15/2011 Document Reviewed: 07/18/2008 [facility] Patient Information 2014 [place].

## 2013-06-19 NOTE — MAU Provider Note (Signed)
Chief Complaint: Possible Pregnancy and Abdominal Cramping   First Provider Initiated Contact with Patient 06/19/13 2139       SUBJECTIVE HPI: Paula Cervantes is a 38 y.o. G2P1001 at [redacted]w[redacted]d by LMP who presents with +UPT today having cramping, denies bleeding. N/V since arrival to MAU, resolved. Rates pain 8/q10 on pain scale at worst. Has not tried anything for the pain due to fear of SAB. Had SAB in March 2015.    Past Medical History  Diagnosis Date  . Abnormal Pap smear   . Fibroids   . Endometriosis   . Gout   . Ovarian cancer   . Asthma     no episodes/no inhaler needed   OB History  Gravida Para Term Preterm AB SAB TAB Ectopic Multiple Living  3 1 1  1 1    1     # Outcome Date GA Lbr Len/2nd Weight Sex Delivery Anes PTL Lv  3 CUR           2 TRM 09/20/12 [redacted]w[redacted]d  3.25 kg (7 lb 2.6 oz) F LTCS Spinal  Y  1 SAB              Past Surgical History  Procedure Laterality Date  . Ovarian fibroids    . Knee surgery    . No past surgeries    . Cryotherapy    . Diagnostic laparoscopy    . Cesarean section N/A 09/20/2012    Procedure: PRIMARY CESAREAN SECTION;  Surgeon: Margarette Asal, MD;  Location: [facility];  Service: Obstetrics;  Laterality: N/A;  Primary edc 8/25   History   Social History  . Marital Status: Divorced    Spouse Name: N/A    Number of Children: N/A  . Years of Education: N/A   Occupational History  . Not on file.   Social History Main Topics  . Smoking status: Former Smoker    Quit date: 03/27/2002  . Smokeless tobacco: Never Used  . Alcohol Use: No  . Drug Use: No  . Sexual Activity: Yes    Birth Control/ Protection: None   Other Topics Concern  . Not on file   Social History Narrative  . No narrative on file   No current facility-administered medications on file prior to encounter.   Current Outpatient Prescriptions on File Prior to Encounter  Medication Sig Dispense Refill  . acetaminophen (TYLENOL) 500 MG tablet Take 1,000 mg by  mouth every 6 (six) hours as needed for mild pain.       Marland Kitchen amitriptyline (ELAVIL) 25 MG tablet Take 1 tablet (25 mg total) by mouth at bedtime.  30 tablet  0  . meclizine (ANTIVERT) 25 MG tablet Take 1 tablet (25 mg total) by mouth 3 (three) times daily as needed for dizziness.  30 tablet  0   Allergies  Allergen Reactions  . Penicillins Nausea And Vomiting and Other (See Comments)    Caused patient to pass out.   . Sulfa Antibiotics Other (See Comments)    Blisters in mouth and throat  . Aspirin Hives    Pt states she can take Ibuprofen  . Coconut Flavor Hives    ROS: Pertinent positive items in HPI. Neg for fever, chills, urinary complaints, Diarrhea, constipation, VB or vaginal discharge.   OBJECTIVE Blood pressure 143/76, pulse 119, temperature 98.8 F (37.1 C), temperature source Oral, resp. rate 16, height 5\' 7"  (1.702 m), weight 93.169 kg (205 lb 6.4 oz), last menstrual period 05/11/2013, unknown  if currently breastfeeding. GENERAL: Well-developed, well-nourished female in no acute distress.  HEENT: Normocephalic HEART: normal rate RESP: normal effort ABDOMEN: Soft, mild tenderness across entire low abd, R>L. Pos BS x 4. No CVAT.  EXTREMITIES: Nontender, no edema NEURO: Alert and oriented SPECULUM EXAM: NEFG, physiologic discharge, no blood noted, cervix clean BIMANUAL: cervix closed; uterus ? Slightly enlarged, no adnexal tenderness or masses. No CMT.   LAB RESULTS Results for orders placed during the hospital encounter of 06/19/13 (from the past 24 hour(s))  URINALYSIS, ROUTINE W REFLEX MICROSCOPIC     Status: None   Collection Time    06/19/13  9:05 PM      Result Value Ref Range   Color, Urine YELLOW  YELLOW   APPearance CLEAR  CLEAR   Specific Gravity, Urine 1.020  1.005 - 1.030   pH 6.5  5.0 - 8.0   Glucose, UA NEGATIVE  NEGATIVE mg/dL   Hgb urine dipstick NEGATIVE  NEGATIVE   Bilirubin Urine NEGATIVE  NEGATIVE   Ketones, ur NEGATIVE  NEGATIVE mg/dL    Protein, ur NEGATIVE  NEGATIVE mg/dL   Urobilinogen, UA 0.2  0.0 - 1.0 mg/dL   Nitrite NEGATIVE  NEGATIVE   Leukocytes, UA NEGATIVE  NEGATIVE  POCT PREGNANCY, URINE     Status: Abnormal   Collection Time    06/19/13  9:10 PM      Result Value Ref Range   Preg Test, Ur POSITIVE (*) NEGATIVE  HCG, QUANTITATIVE, PREGNANCY     Status: Abnormal   Collection Time    06/19/13  9:25 PM      Result Value Ref Range   hCG, Beta Chain, Quant, S 5248 (*) <5 mIU/mL  CBC     Status: None   Collection Time    06/19/13  9:25 PM      Result Value Ref Range   WBC 6.4  4.0 - 10.5 K/uL   RBC 4.25  3.87 - 5.11 MIL/uL   Hemoglobin 12.5  12.0 - 15.0 g/dL   HCT 38.0  36.0 - 46.0 %   MCV 89.4  78.0 - 100.0 fL   MCH 29.4  26.0 - 34.0 pg   MCHC 32.9  30.0 - 36.0 g/dL   RDW 12.8  11.5 - 15.5 %   Platelets 327  150 - 400 K/uL    IMAGING No results found.  MAU COURSE CBC, quant, UA, [place], wet prep , GC/CT.  N/V returned. Phenergan given.  N/V resolved.   ASSESSMENT 1. Abdominal pain in pregnancy, antepartum     PLAN Discharge home per consult w/ Dr. Willis Modena. GC/CT pending. Comfort measures.      Follow-up Information   Follow up with [facility]. (Start prenatal care)    Contact information:   [place] [address] [phone]       Follow up with [facility]. (As needed in emergencies)    Contact information:   [address] [id number] [place] [address] [phone]       Medication List         acetaminophen 500 MG tablet  Commonly known as:  TYLENOL  Take 1,000 mg by mouth every 6 (six) hours as needed for mild pain.     amitriptyline 25 MG tablet  Commonly known as:  ELAVIL  Take 1 tablet (25 mg total) by mouth at bedtime.     meclizine 25 MG tablet  Commonly  known as:  ANTIVERT  Take 1 tablet (25 mg total) by mouth 3 (three) times daily as needed for dizziness.      promethazine 25 MG tablet  Commonly known as:  PHENERGAN  Take 1 tablet (25 mg total) by mouth every 6 (six) hours as needed for nausea or vomiting.         Doran, CNM 06/19/2013  11:52 PM

## 2013-06-19 NOTE — MAU Note (Signed)
Pt miscarried in March.  LMP 05/11/2013, +UPT today having cramping, denies bleeding.

## 2013-06-20 LAB — GC/CHLAMYDIA PROBE AMP
CT Probe RNA: NEGATIVE
GC Probe RNA: NEGATIVE

## 2013-06-30 ENCOUNTER — Encounter (HOSPITAL_COMMUNITY): Payer: Self-pay | Admitting: Emergency Medicine

## 2013-06-30 ENCOUNTER — Emergency Department (HOSPITAL_COMMUNITY)
Admission: EM | Admit: 2013-06-30 | Discharge: 2013-06-30 | Disposition: A | Discharge status: Home / Self Care | Payer: BC Managed Care – PPO | Source: Home / Self Care | Attending: Emergency Medicine | Admitting: Emergency Medicine

## 2013-06-30 DIAGNOSIS — M461 Sacroiliitis, not elsewhere classified: Secondary | ICD-10-CM

## 2013-06-30 MED ORDER — LIDOCAINE 5 % EX PTCH: 1.0000 | MEDICATED_PATCH | CUTANEOUS | Status: DC

## 2013-06-30 NOTE — ED Provider Notes (Signed)
CSN: [id number]     Arrival date & time 06/30/13  1353 History   First MD Initiated Contact with Patient 06/30/13 1414     Chief Complaint  Patient presents with  . Back Pain   (Consider location/radiation/quality/duration/timing/severity/associated sxs/prior Treatment) HPI Comments: 38 year old female who is 2 months pregnant presents complaining of back pain. For 2 days she has worsening back pain in her right lower back. It is worse with movement it is somewhat relieved by rest. She has tried putting ice packs on the area and taking Tylenol but that has not helped. She denies any specific injury. She has never had this before. No problems with the pregnancy thus far, no abdominal pain or vaginal bleeding. No numbness or weakness in the extremities. The pain does not radiate down her legs. No loss of bowel or bladder control.  Patient is a 38 y.o. female presenting with back pain.  Back Pain   Past Medical History  Diagnosis Date  . Abnormal Pap smear   . Fibroids   . Endometriosis   . Gout   . Ovarian cancer   . Asthma     no episodes/no inhaler needed   Past Surgical History  Procedure Laterality Date  . Ovarian fibroids    . Knee surgery    . No past surgeries    . Cryotherapy    . Diagnostic laparoscopy    . Cesarean section N/A 09/20/2012    Procedure: PRIMARY CESAREAN SECTION;  Surgeon: Margarette Asal, MD;  Location: [facility];  Service: Obstetrics;  Laterality: N/A;  Primary edc 8/25   History reviewed. No pertinent family history. History  Substance Use Topics  . Smoking status: Former Smoker    Quit date: 03/27/2002  . Smokeless tobacco: Never Used  . Alcohol Use: No   OB History   Grav Para Term Preterm Abortions TAB SAB Ect Mult Living   3 1 1  1  1   1      Review of Systems  Musculoskeletal: Positive for back pain.  All other systems reviewed and are negative.   Allergies  Penicillins; Sulfa antibiotics; Aspirin; and Coconut flavor  Home  Medications   Prior to Admission medications   Medication Sig Start Date End Date Taking? Authorizing Provider  acetaminophen (TYLENOL) 500 MG tablet Take 1,000 mg by mouth every 6 (six) hours as needed for mild pain.     Historical Provider, MD  amitriptyline (ELAVIL) 25 MG tablet Take 1 tablet (25 mg total) by mouth at bedtime. 06/11/13   Harden Mo, MD  lidocaine (LIDODERM) 5 % Place 1 patch onto the skin daily. Remove & Discard patch within 12 hours or as directed by MD 06/30/13   Liam Graham, PA-C  meclizine (ANTIVERT) 25 MG tablet Take 1 tablet (25 mg total) by mouth 3 (three) times daily as needed for dizziness. 06/11/13   Harden Mo, MD  promethazine (PHENERGAN) 25 MG tablet Take 1 tablet (25 mg total) by mouth every 6 (six) hours as needed for nausea or vomiting. 06/19/13   Manya Silvas, CNM   BP 129/79  Pulse 104  Temp(Src) 99 F (37.2 C) (Oral)  Resp 12  SpO2 100%  LMP 05/11/2013 Physical Exam  Nursing note and vitals reviewed. Constitutional: She is oriented to person, place, and time. Vital signs are normal. She appears well-developed and well-nourished. No distress.  HENT:  Head: Normocephalic and atraumatic.  Pulmonary/Chest: Effort normal. No respiratory distress.  Musculoskeletal:  Thoracic back: Normal.       Lumbar back: Normal.  Tenderness to palpation at the right SI joint only  Neurological: She is alert and oriented to person, place, and time. She has normal strength. Coordination normal.  Skin: Skin is warm and dry. No rash noted. She is not diaphoretic.  Psychiatric: She has a normal mood and affect. Judgment normal.    ED Course  Procedures (including critical care time) Labs Review Labs Reviewed - No data to display  Imaging Review No results found.   MDM   1. Sacroiliitis    We discussed exercises she could perform to help alleviate pain in the sacroiliac joint. Also prescribed Lidoderm patches and she will continue to take  ibuprofen as needed. Followup with OB/GYN for referral to physical therapy if this continues.   Meds ordered this encounter  Medications  . lidocaine (LIDODERM) 5 %    Sig: Place 1 patch onto the skin daily. Remove & Discard patch within 12 hours or as directed by MD    Dispense:  30 patch    Refill:  0    Order Specific Question:  Supervising Provider    Answer:  Ihor Gully D Frederika, PA-C 06/30/13 [phone]

## 2013-06-30 NOTE — ED Notes (Signed)
Pt is  2  Months  Pregnant  -      She  Reports  Back pain    X  2  Days   - she  denys  Any injury      She  denys  Any urinary problems       She  Ambulated to  Room  With steady  Fluid gait    She  dedenys  Any  Bleeding or  Cramping     She   Has taken tylenol  For  The pain

## 2013-06-30 NOTE — Discharge Instructions (Signed)
Perform the stretching exercises we discussed and use the patches on your back.  If no improvement in a few days, follow up with your primary care physician or OBGYN for referral to physical therapy.

## 2013-07-03 NOTE — ED Provider Notes (Signed)
Medical screening examination/treatment/procedure(s) were performed by a resident physician or non-physician practitioner and as the supervising physician I was immediately available for consultation/collaboration.  Lynne Leader, MD    Gregor Hams, MD 07/03/13 [phone]

## 2013-07-09 ENCOUNTER — Inpatient Hospital Stay (HOSPITAL_COMMUNITY)
Admission: AD | Admit: 2013-07-09 | Discharge: 2013-07-09 | Disposition: A | Discharge status: Home / Self Care | Payer: BC Managed Care – PPO | Source: Ambulatory Visit | Attending: Obstetrics and Gynecology | Admitting: Obstetrics and Gynecology

## 2013-07-09 ENCOUNTER — Encounter (HOSPITAL_COMMUNITY): Payer: Self-pay | Admitting: *Deleted

## 2013-07-09 DIAGNOSIS — O99891 Other specified diseases and conditions complicating pregnancy: Secondary | ICD-10-CM | POA: Insufficient documentation

## 2013-07-09 DIAGNOSIS — R109 Unspecified abdominal pain: Secondary | ICD-10-CM | POA: Insufficient documentation

## 2013-07-09 DIAGNOSIS — O9989 Other specified diseases and conditions complicating pregnancy, childbirth and the puerperium: Secondary | ICD-10-CM

## 2013-07-09 DIAGNOSIS — N949 Unspecified condition associated with female genital organs and menstrual cycle: Secondary | ICD-10-CM | POA: Insufficient documentation

## 2013-07-09 DIAGNOSIS — M549 Dorsalgia, unspecified: Secondary | ICD-10-CM

## 2013-07-09 LAB — URINALYSIS, ROUTINE W REFLEX MICROSCOPIC
Bilirubin Urine: NEGATIVE
GLUCOSE, UA: NEGATIVE mg/dL
Hgb urine dipstick: NEGATIVE
Ketones, ur: NEGATIVE mg/dL
Leukocytes, UA: NEGATIVE
Nitrite: NEGATIVE
Protein, ur: NEGATIVE mg/dL
SPECIFIC GRAVITY, URINE: 1.02 (ref 1.005–1.030)
Urobilinogen, UA: 0.2 mg/dL (ref 0.0–1.0)
pH: 6.5 (ref 5.0–8.0)

## 2013-07-09 NOTE — Discharge Instructions (Signed)
Back Pain in Pregnancy °Back pain during pregnancy is common. It happens in about half of all pregnancies. It is important for you and your baby that you remain active during your pregnancy. If you feel that back pain is not allowing you to remain active or sleep well, it is time to see your caregiver. Back pain may be caused by several factors related to changes during your pregnancy. Fortunately, unless you had trouble with your back before your pregnancy, the pain is likely to get better after you deliver. °Low back pain usually occurs between the fifth and seventh months of pregnancy. It can, however, happen in the first couple months. Factors that increase the risk of back problems include:  °· Previous back problems. °· Injury to your back. °· Having twins or multiple births. °· A chronic cough. °· Stress. °· Job-related repetitive motions. °· Muscle or spinal disease in the back. °· Family history of back problems, ruptured (herniated) discs, or osteoporosis. °· Depression, anxiety, and panic attacks. °CAUSES  °· When you are pregnant, your body produces a hormone called relaxin. This hormone makes the ligaments connecting the low back and pubic bones more flexible. This flexibility allows the baby to be delivered more easily. When your ligaments are loose, your muscles need to work harder to support your back. Soreness in your back can come from tired muscles. Soreness can also come from back tissues that are irritated since they are receiving less support. °· As the baby grows, it puts pressure on the nerves and blood vessels in your pelvis. This can cause back pain. °· As the baby grows and gets heavier during pregnancy, the uterus pushes the stomach muscles forward and changes your center of gravity. This makes your back muscles work harder to maintain good posture. °SYMPTOMS  °Lumbar pain during pregnancy °Lumbar pain during pregnancy usually occurs at or above the waist in the center of the back. There  may be pain and numbness that radiates into your leg or foot. This is similar to low back pain experienced by non-pregnant women. It usually increases with sitting for long periods of time, standing, or repetitive lifting. Tenderness may also be present in the muscles along your upper back. °Posterior pelvic pain during pregnancy °Pain in the back of the pelvis is more common than lumbar pain in pregnancy. It is a deep pain felt in your side at the waistline, or across the tailbone (sacrum), or in both places. You may have pain on one or both sides. This pain can also go into the buttocks and backs of the upper thighs. Pubic and groin pain may also be present. The pain does not quickly resolve with rest, and morning stiffness may also be present. °Pelvic pain during pregnancy can be brought on by most activities. A high level of fitness before and during pregnancy may or may not prevent this problem. Labor pain is usually 1 to 2 minutes apart, lasts for about 1 minute, and involves a bearing down feeling or pressure in your pelvis. However, if you are at term with the pregnancy, constant low back pain can be the beginning of early labor, and you should be aware of this. °DIAGNOSIS  °X-rays of the back should not be done during the first 12 to 14 weeks of the pregnancy and only when absolutely necessary during the rest of the pregnancy. MRIs do not give off radiation and are safe during pregnancy. MRIs also should only be done when absolutely necessary. °HOME CARE INSTRUCTIONS °· Exercise   as directed by your caregiver. Exercise is the most effective way to prevent or manage back pain. If you have a back problem, it is especially important to avoid sports that require sudden body movements. Swimming and walking are great activities.  Do not stand in one place for long periods of time.  Do not wear high heels.  Sit in chairs with good posture. Use a pillow on your lower back if necessary. Make sure your head  rests over your shoulders and is not hanging forward.  Try sleeping on your side, preferably the left side, with a pillow or two between your legs. If you are sore after a night's rest, your bedmay betoo soft.Try placing a board between your mattress and box spring.  Listen to your body when lifting.If you are experiencing pain, ask for help or try bending yourknees more so you can use your leg muscles rather than your back muscles. Squat down when picking up something from the floor. Do not bend over.  Eat a healthy diet. Try to gain weight within your caregiver's recommendations.  Use heat or cold packs 3 to 4 times a day for 15 minutes to help with the pain.  Only take over-the-counter or prescription medicines for pain, discomfort, or fever as directed by your caregiver. Sudden (acute) back pain  Use bed rest for only the most extreme, acute episodes of back pain. Prolonged bed rest over 48 hours will aggravate your condition.  Ice is very effective for acute conditions.  Put ice in a plastic bag.  Place a towel between your skin and the bag.  Leave the ice on for 10 to 20 minutes every 2 hours, or as needed.  Using heat packs for 30 minutes prior to activities is also helpful. Continued back pain See your caregiver if you have continued problems. Your caregiver can help or refer you for appropriate physical therapy. With conditioning, most back problems can be avoided. Sometimes, a more serious issue may be the cause of back pain. You should be seen right away if new problems seem to be developing. Your caregiver may recommend:  A maternity girdle.  An elastic sling.  A back brace.  A massage therapist or acupuncture. SEEK MEDICAL CARE IF:   You are not able to do most of your daily activities, even when taking the pain medicine you were given.  You need a referral to a physical therapist or chiropractor.  You want to try acupuncture. SEEK IMMEDIATE MEDICAL CARE  IF:  You develop numbness, tingling, weakness, or problems with the use of your arms or legs.  You develop severe back pain that is no longer relieved with medicines.  You have a sudden change in bowel or bladder control.  You have increasing pain in other areas of the body.  You develop shortness of breath, dizziness, or fainting.  You develop nausea, vomiting, or sweating.  You have back pain which is similar to labor pains.  You have back pain along with your water breaking or vaginal bleeding.  You have back pain or numbness that travels down your leg.  Your back pain developed after you fell.  You develop pain on one side of your back. You may have a kidney stone.  You see blood in your urine. You may have a bladder infection or kidney stone.  You have back pain with blisters. You may have shingles. Back pain is fairly common during pregnancy but should not be accepted as just part of  the process. Back pain should always be treated as soon as possible. This will make your pregnancy as pleasant as possible. Document Released: 04/30/2005 Document Revised: 04/14/2011 Document Reviewed: 06/11/2010 [facility] Patient Information 2014 [place], [place].  Back Exercises Back exercises help treat and prevent back injuries. The goal of back exercises is to increase the strength of your abdominal and back muscles and the flexibility of your back. These exercises should be started when you no longer have back pain. Back exercises include:  Pelvic Tilt. Lie on your back with your knees bent. Tilt your pelvis until the lower part of your back is against the floor. Hold this position 5 to 10 sec and repeat 5 to 10 times.  Knee to Chest. Pull first 1 knee up against your chest and hold for 20 to 30 seconds, repeat this with the other knee, and then both knees. This may be done with the other leg straight or bent, whichever feels better.  Sit-Ups or Curl-Ups. Bend your knees 90 degrees. Start  with tilting your pelvis, and do a partial, slow sit-up, lifting your trunk only 30 to 45 degrees off the floor. Take at least 2 to 3 seconds for each sit-up. Do not do sit-ups with your knees out straight. If partial sit-ups are difficult, simply do the above but with only tightening your abdominal muscles and holding it as directed.  Hip-Lift. Lie on your back with your knees flexed 90 degrees. Push down with your feet and shoulders as you raise your hips a couple inches off the floor; hold for 10 seconds, repeat 5 to 10 times.  Back arches. Lie on your stomach, propping yourself up on bent elbows. Slowly press on your hands, causing an arch in your low back. Repeat 3 to 5 times. Any initial stiffness and discomfort should lessen with repetition over time.  Shoulder-Lifts. Lie face down with arms beside your body. Keep hips and torso pressed to floor as you slowly lift your head and shoulders off the floor. Do not overdo your exercises, especially in the beginning. Exercises may cause you some mild back discomfort which lasts for a few minutes; however, if the pain is more severe, or lasts for more than 15 minutes, do not continue exercises until you see your caregiver. Improvement with exercise therapy for back problems is slow.  See your caregivers for assistance with developing a proper back exercise program. Document Released: 02/28/2004 Document Revised: 04/14/2011 Document Reviewed: 11/21/2010 [facility] Patient Information 2014 [place].

## 2013-07-09 NOTE — MAU Note (Signed)
Pelvic pain/lower abd pain past couple days.  Today started  Having pain low back- this is how she felt when she miscarried in March.

## 2013-07-09 NOTE — MAU Note (Signed)
C/o abdominal cramping for past 2 days; c/o back pain since this AM; denies any bleeding; 8 weeks 3 days gestation;

## 2013-07-09 NOTE — MAU Note (Signed)
Hx of SAB back in March; states that her back was "hurting like this" when she miscarried;  Anxious; denies any bleeding;

## 2013-07-21 LAB — OB RESULTS CONSOLE GC/CHLAMYDIA
Chlamydia: NEGATIVE
Gonorrhea: NEGATIVE

## 2013-07-21 LAB — OB RESULTS CONSOLE RUBELLA ANTIBODY, IGM: RUBELLA: IMMUNE

## 2013-07-21 LAB — OB RESULTS CONSOLE ANTIBODY SCREEN: ANTIBODY SCREEN: NEGATIVE

## 2013-07-21 LAB — OB RESULTS CONSOLE ABO/RH: RH TYPE: POSITIVE

## 2013-07-21 LAB — OB RESULTS CONSOLE HEPATITIS B SURFACE ANTIGEN: Hepatitis B Surface Ag: NEGATIVE

## 2013-07-21 LAB — OB RESULTS CONSOLE HIV ANTIBODY (ROUTINE TESTING): HIV: NONREACTIVE

## 2013-07-21 LAB — OB RESULTS CONSOLE RPR: RPR: NONREACTIVE

## 2013-08-01 ENCOUNTER — Encounter (HOSPITAL_COMMUNITY): Payer: Self-pay | Admitting: *Deleted

## 2013-08-01 ENCOUNTER — Inpatient Hospital Stay (HOSPITAL_COMMUNITY)
Admission: AD | Admit: 2013-08-01 | Discharge: 2013-08-02 | Disposition: A | Discharge status: Home / Self Care | Payer: BC Managed Care – PPO | Source: Ambulatory Visit | Attending: Obstetrics and Gynecology | Admitting: Obstetrics and Gynecology

## 2013-08-01 DIAGNOSIS — O99891 Other specified diseases and conditions complicating pregnancy: Secondary | ICD-10-CM | POA: Insufficient documentation

## 2013-08-01 DIAGNOSIS — M545 Low back pain, unspecified: Secondary | ICD-10-CM | POA: Insufficient documentation

## 2013-08-01 DIAGNOSIS — M549 Dorsalgia, unspecified: Secondary | ICD-10-CM

## 2013-08-01 DIAGNOSIS — Z87891 Personal history of nicotine dependence: Secondary | ICD-10-CM | POA: Insufficient documentation

## 2013-08-01 DIAGNOSIS — O9989 Other specified diseases and conditions complicating pregnancy, childbirth and the puerperium: Principal | ICD-10-CM

## 2013-08-01 LAB — URINALYSIS, ROUTINE W REFLEX MICROSCOPIC
BILIRUBIN URINE: NEGATIVE
Glucose, UA: NEGATIVE mg/dL
Hgb urine dipstick: NEGATIVE
KETONES UR: 15 mg/dL — AB
Leukocytes, UA: NEGATIVE
NITRITE: NEGATIVE
PROTEIN: NEGATIVE mg/dL
Specific Gravity, Urine: 1.015 (ref 1.005–1.030)
Urobilinogen, UA: 1 mg/dL (ref 0.0–1.0)
pH: 7 (ref 5.0–8.0)

## 2013-08-01 MED ORDER — IBUPROFEN 800 MG PO TABS
800.0000 mg | ORAL_TABLET | Freq: Once | ORAL | Status: AC
  Administered 2013-08-01: 800 mg via ORAL
  Filled 2013-08-01: qty 1

## 2013-08-01 NOTE — MAU Note (Signed)
PT  SAYS  HER BACK STARTED HURTING ON Friday-  DID NOT CALL OFFICE. SHE TOOK TYLENOL EVERYDAY  AND LAST WAS AT 4 PM TODAY.  ALSO USES LIDOCAINE PATCHES-  LAST USED WAS YESTERDAY.   AT 5-[redacted] WEEK GESTATION-  - SHE HAD BACK PAIN-  GAVE HER PATCHES.      LAST SEEN IN OFFICE-  6-18.   NEXT APPPOINTMENT-  7-6.  LAST SEX-  6-6.

## 2013-08-01 NOTE — MAU Provider Note (Signed)
History     CSN: [id number]  Arrival date and time: 08/01/13 2235   First Riverlyn Kizziah Initiated Contact with Patient 08/01/13 2338      No chief complaint on file.  HPI  Paula Cervantes is a 38 y.o. G3P1011 at [redacted]w[redacted]d who presents today with lower back pain. She has had the pain since Friday. She has taken tylenol, and used lidocaine patches without any relief. She has also been using a heating pad. She denies any abdominal pain or bleeding.   Past Medical History  Diagnosis Date  . Abnormal Pap smear   . Fibroids   . Endometriosis   . Gout   . Ovarian cancer   . Asthma     no episodes/no inhaler needed  . Cervical cancer     Past Surgical History  Procedure Laterality Date  . Ovarian fibroids    . Knee surgery    . No past surgeries    . Cryotherapy    . Diagnostic laparoscopy    . Cesarean section N/A 09/20/2012    Procedure: PRIMARY CESAREAN SECTION;  Surgeon: Margarette Asal, MD;  Location: [facility];  Service: Obstetrics;  Laterality: N/A;  Primary edc 8/25    Family History  Problem Relation Age of Onset  . Alcohol abuse Neg Hx   . Arthritis Neg Hx   . Asthma Neg Hx   . Birth defects Neg Hx   . Cancer Neg Hx   . COPD Neg Hx   . Depression Neg Hx   . Diabetes Neg Hx   . Drug abuse Neg Hx   . Early death Neg Hx   . Hearing loss Neg Hx   . Heart disease Neg Hx   . Hyperlipidemia Neg Hx   . Hypertension Neg Hx   . Kidney disease Neg Hx   . Learning disabilities Neg Hx   . Mental illness Neg Hx   . Mental retardation Neg Hx   . Miscarriages / Stillbirths Neg Hx   . Stroke Neg Hx   . Vision loss Neg Hx   . Varicose Veins Neg Hx     History  Substance Use Topics  . Smoking status: Former Smoker    Quit date: 03/27/2002  . Smokeless tobacco: Never Used  . Alcohol Use: No    Allergies:  Allergies  Allergen Reactions  . Penicillins Nausea And Vomiting and Other (See Comments)    Caused pt to pass out.   . Sulfa Antibiotics Other (See Comments)   Blisters in mouth and throat  . Aspirin Hives and Other (See Comments)    Causes pt to pass out.  Pt states that she is able to take ibuprofen.    Marland Kitchen Coconut Flavor Hives    Prescriptions prior to admission  Medication Sig Dispense Refill  . acetaminophen (TYLENOL) 500 MG tablet Take 1,000 mg by mouth every 6 (six) hours as needed for mild pain.       Marland Kitchen lidocaine (LIDODERM) 5 % Place 1 patch onto the skin daily. Remove & Discard patch within 12 hours or as directed by MD      . Prenatal Vit-Fe Fumarate-FA (PRENATAL MULTIVITAMIN) TABS tablet Take 1 tablet by mouth daily at 12 noon.      . promethazine (PHENERGAN) 25 MG tablet Take 1 tablet (25 mg total) by mouth every 6 (six) hours as needed for nausea or vomiting.  30 tablet  1    ROS Physical Exam   Blood pressure 126/65, pulse  110, temperature 98.9 F (37.2 C), temperature source Oral, resp. rate 20, height 5\' 5"  (1.651 m), weight 92.704 kg (204 lb 6 oz), last menstrual period 05/11/2013, unknown if currently breastfeeding.  Physical Exam  Nursing note and vitals reviewed. Constitutional: She is oriented to person, place, and time. She appears well-developed and well-nourished. No distress.  Cardiovascular: Normal rate.   Respiratory: Effort normal.  GI: Soft. There is no tenderness. There is no rebound.  Genitourinary:  No CVA tenderness  Uterus: at [facility], FHT 164 with doppler   Neurological: She is alert and oriented to person, place, and time.  Skin: Skin is warm and dry.  Psychiatric: She has a normal mood and affect.    MAU Course  Procedures  Results for orders placed during the hospital encounter of 08/01/13 (from the past 24 hour(s))  URINALYSIS, ROUTINE W REFLEX MICROSCOPIC     Status: Abnormal   Collection Time    08/01/13 10:54 PM      Result Value Ref Range   Color, Urine YELLOW  YELLOW   APPearance CLEAR  CLEAR   Specific Gravity, Urine 1.015  1.005 - 1.030   pH 7.0  5.0 - 8.0   Glucose, UA NEGATIVE  NEGATIVE  mg/dL   Hgb urine dipstick NEGATIVE  NEGATIVE   Bilirubin Urine NEGATIVE  NEGATIVE   Ketones, ur 15 (*) NEGATIVE mg/dL   Protein, ur NEGATIVE  NEGATIVE mg/dL   Urobilinogen, UA 1.0  0.0 - 1.0 mg/dL   Nitrite NEGATIVE  NEGATIVE   Leukocytes, UA NEGATIVE  NEGATIVE   0102: Pain is slightly improved. Patient does not want any more medications at this time. Discussed comfort measures.   Assessment and Plan   1. Back pain affecting pregnancy in first trimester    Second trimester precautions reviewed Return to MAU as needed Back exercises Icy hot patches Rx: Vicodin #[id number]   Mathis Bud 08/01/2013, 11:39 PM

## 2013-08-02 DIAGNOSIS — O9989 Other specified diseases and conditions complicating pregnancy, childbirth and the puerperium: Secondary | ICD-10-CM

## 2013-08-02 DIAGNOSIS — M549 Dorsalgia, unspecified: Secondary | ICD-10-CM

## 2013-08-02 MED ORDER — HYDROCODONE-ACETAMINOPHEN 5-325 MG PO TABS: 1.0000 | ORAL_TABLET | Freq: Four times a day (QID) | ORAL | Status: DC | PRN

## 2013-08-02 NOTE — Discharge Instructions (Signed)
Back Exercises Back exercises help treat and prevent back injuries. The goal of back exercises is to increase the strength of your abdominal and back muscles and the flexibility of your back. These exercises should be started when you no longer have back pain. Back exercises include:  Pelvic Tilt. Lie on your back with your knees bent. Tilt your pelvis until the lower part of your back is against the floor. Hold this position 5 to 10 sec and repeat 5 to 10 times.  Knee to Chest. Pull first 1 knee up against your chest and hold for 20 to 30 seconds, repeat this with the other knee, and then both knees. This may be done with the other leg straight or bent, whichever feels better.  Sit-Ups or Curl-Ups. Bend your knees 90 degrees. Start with tilting your pelvis, and do a partial, slow sit-up, lifting your trunk only 30 to 45 degrees off the floor. Take at least 2 to 3 seconds for each sit-up. Do not do sit-ups with your knees out straight. If partial sit-ups are difficult, simply do the above but with only tightening your abdominal muscles and holding it as directed.  Hip-Lift. Lie on your back with your knees flexed 90 degrees. Push down with your feet and shoulders as you raise your hips a couple inches off the floor; hold for 10 seconds, repeat 5 to 10 times.  Back arches. Lie on your stomach, propping yourself up on bent elbows. Slowly press on your hands, causing an arch in your low back. Repeat 3 to 5 times. Any initial stiffness and discomfort should lessen with repetition over time.  Shoulder-Lifts. Lie face down with arms beside your body. Keep hips and torso pressed to floor as you slowly lift your head and shoulders off the floor. Do not overdo your exercises, especially in the beginning. Exercises may cause you some mild back discomfort which lasts for a few minutes; however, if the pain is more severe, or lasts for more than 15 minutes, do not continue exercises until you see your caregiver.  Improvement with exercise therapy for back problems is slow.  See your caregivers for assistance with developing a proper back exercise program. Document Released: 02/28/2004 Document Revised: 04/14/2011 Document Reviewed: 11/21/2010 [facility] Patient Information 2015 [place], [place]. This information is not intended to replace advice given to you by your health care provider. Make sure you discuss any questions you have with your health care provider.  Second Trimester of Pregnancy The second trimester is from week 13 through week 28, months 4 through 6. The second trimester is often a time when you feel your best. Your body has also adjusted to being pregnant, and you begin to feel better physically. Usually, morning sickness has lessened or quit completely, you may have more energy, and you may have an increase in appetite. The second trimester is also a time when the fetus is growing rapidly. At the end of the sixth month, the fetus is about 9 inches long and weighs about 1 pounds. You will likely begin to feel the baby move (quickening) between 18 and 20 weeks of the pregnancy. BODY CHANGES Your body goes through many changes during pregnancy. The changes vary from woman to woman.   Your weight will continue to increase. You will notice your lower abdomen bulging out.  You may begin to get stretch marks on your hips, abdomen, and breasts.  You may develop headaches that can be relieved by medicines approved by your health care  provider.  You may urinate more often because the fetus is pressing on your bladder.  You may develop or continue to have heartburn as a result of your pregnancy.  You may develop constipation because certain hormones are causing the muscles that push waste through your intestines to slow down.  You may develop hemorrhoids or swollen, bulging veins (varicose veins).  You may have back pain because of the weight gain and pregnancy hormones relaxing your joints  between the bones in your pelvis and as a result of a shift in weight and the muscles that support your balance.  Your breasts will continue to grow and be tender.  Your gums may bleed and may be sensitive to brushing and flossing.  Dark spots or blotches (chloasma, mask of pregnancy) may develop on your face. This will likely fade after the baby is born.  A dark line from your belly button to the pubic area (linea nigra) may appear. This will likely fade after the baby is born.  You may have changes in your hair. These can include thickening of your hair, rapid growth, and changes in texture. Some women also have hair loss during or after pregnancy, or hair that feels dry or thin. Your hair will most likely return to normal after your baby is born. WHAT TO EXPECT AT YOUR PRENATAL VISITS During a routine prenatal visit:  You will be weighed to make sure you and the fetus are growing normally.  Your blood pressure will be taken.  Your abdomen will be measured to track your baby's growth.  The fetal heartbeat will be listened to.  Any test results from the previous visit will be discussed. Your health care provider may ask you:  How you are feeling.  If you are feeling the baby move.  If you have had any abnormal symptoms, such as leaking fluid, bleeding, severe headaches, or abdominal cramping.  If you have any questions. Other tests that may be performed during your second trimester include:  Blood tests that check for:  Low iron levels (anemia).  Gestational diabetes (between 24 and 28 weeks).  Rh antibodies.  Urine tests to check for infections, diabetes, or protein in the urine.  An ultrasound to confirm the proper growth and development of the baby.  An amniocentesis to check for possible genetic problems.  Fetal screens for spina bifida and Down syndrome. HOME CARE INSTRUCTIONS   Avoid all smoking, herbs, alcohol, and unprescribed drugs. These chemicals affect  the formation and growth of the baby.  Follow your health care provider's instructions regarding medicine use. There are medicines that are either safe or unsafe to take during pregnancy.  Exercise only as directed by your health care provider. Experiencing uterine cramps is a good sign to stop exercising.  Continue to eat regular, healthy meals.  Wear a good support bra for breast tenderness.  Do not use hot tubs, steam rooms, or saunas.  Wear your seat belt at all times when driving.  Avoid raw meat, uncooked cheese, cat litter boxes, and soil used by cats. These carry germs that can cause birth defects in the baby.  Take your prenatal vitamins.  Try taking a stool softener (if your health care provider approves) if you develop constipation. Eat more high-fiber foods, such as fresh vegetables or fruit and whole grains. Drink plenty of fluids to keep your urine clear or pale yellow.  Take warm sitz baths to soothe any pain or discomfort caused by hemorrhoids. Use hemorrhoid cream  if your health care provider approves.  If you develop varicose veins, wear support hose. Elevate your feet for 15 minutes, 3-4 times a day. Limit salt in your diet.  Avoid heavy lifting, wear low heel shoes, and practice good posture.  Rest with your legs elevated if you have leg cramps or low back pain.  Visit your dentist if you have not gone yet during your pregnancy. Use a soft toothbrush to brush your teeth and be gentle when you floss.  A sexual relationship may be continued unless your health care provider directs you otherwise.  Continue to go to all your prenatal visits as directed by your health care provider. SEEK MEDICAL CARE IF:   You have dizziness.  You have mild pelvic cramps, pelvic pressure, or nagging pain in the abdominal area.  You have persistent nausea, vomiting, or diarrhea.  You have a bad smelling vaginal discharge.  You have pain with urination. SEEK IMMEDIATE MEDICAL  CARE IF:   You have a fever.  You are leaking fluid from your vagina.  You have spotting or bleeding from your vagina.  You have severe abdominal cramping or pain.  You have rapid weight gain or loss.  You have shortness of breath with chest pain.  You notice sudden or extreme swelling of your face, hands, ankles, feet, or legs.  You have not felt your baby move in over an hour.  You have severe headaches that do not go away with medicine.  You have vision changes. Document Released: 01/14/2001 Document Revised: 01/25/2013 Document Reviewed: 03/23/2012 [facility] Patient Information 2015 [employer], [place]. This information is not intended to replace advice given to you by your health care provider. Make sure you discuss any questions you have with your health care provider.

## 2013-09-27 ENCOUNTER — Inpatient Hospital Stay (HOSPITAL_COMMUNITY)
Admission: AD | Admit: 2013-09-27 | Discharge: 2013-09-27 | Disposition: A | Discharge status: Home / Self Care | Payer: Medicaid Other | Source: Ambulatory Visit | Attending: Obstetrics and Gynecology | Admitting: Obstetrics and Gynecology

## 2013-09-27 ENCOUNTER — Encounter (HOSPITAL_COMMUNITY): Payer: Self-pay | Admitting: *Deleted

## 2013-09-27 DIAGNOSIS — N949 Unspecified condition associated with female genital organs and menstrual cycle: Secondary | ICD-10-CM | POA: Insufficient documentation

## 2013-09-27 DIAGNOSIS — Z880 Allergy status to penicillin: Secondary | ICD-10-CM | POA: Diagnosis not present

## 2013-09-27 DIAGNOSIS — O9989 Other specified diseases and conditions complicating pregnancy, childbirth and the puerperium: Principal | ICD-10-CM

## 2013-09-27 DIAGNOSIS — Z87891 Personal history of nicotine dependence: Secondary | ICD-10-CM | POA: Insufficient documentation

## 2013-09-27 DIAGNOSIS — R109 Unspecified abdominal pain: Secondary | ICD-10-CM | POA: Insufficient documentation

## 2013-09-27 DIAGNOSIS — O99891 Other specified diseases and conditions complicating pregnancy: Secondary | ICD-10-CM | POA: Insufficient documentation

## 2013-09-27 LAB — URINALYSIS, ROUTINE W REFLEX MICROSCOPIC
BILIRUBIN URINE: NEGATIVE
Glucose, UA: NEGATIVE mg/dL
Hgb urine dipstick: NEGATIVE
KETONES UR: NEGATIVE mg/dL
Leukocytes, UA: NEGATIVE
Nitrite: NEGATIVE
Protein, ur: NEGATIVE mg/dL
SPECIFIC GRAVITY, URINE: 1.025 (ref 1.005–1.030)
UROBILINOGEN UA: 0.2 mg/dL (ref 0.0–1.0)
pH: 6 (ref 5.0–8.0)

## 2013-09-27 LAB — WET PREP, GENITAL
Trich, Wet Prep: NONE SEEN
Yeast Wet Prep HPF POC: NONE SEEN

## 2013-09-27 NOTE — Discharge Instructions (Signed)
Round Ligament Pain During Pregnancy Round ligament pain is a sharp pain or jabbing feeling often felt in the lower belly or groin area on one or both sides. It is one of the most common complaints during pregnancy and is considered a normal part of pregnancy. It is most often felt during the second trimester.  Here is what you need to know about round ligament pain, including some tips to help you feel better.  Causes of Round Ligament Pain  Several thick ligaments surround and support your womb (uterus) as it grows during pregnancy. One of them is called the round ligament.  The round ligament connects the front part of the womb to your groin, the area where your legs attach to your pelvis. The round ligament normally tightens and relaxes slowly.  As your baby and womb grow, the round ligament stretches. That makes it more likely to become strained.  Sudden movements can cause the ligament to tighten quickly, like a rubber band snapping. This causes a sudden and quick jabbing feeling.  Symptoms of Round Ligament Pain  Round ligament pain can be concerning and uncomfortable. But it is considered normal as your body changes during pregnancy.  The symptoms of round ligament pain include a sharp, sudden spasm in the belly. It usually affects the right side, but it may happen on both sides. The pain only lasts a few seconds.  Exercise may cause the pain, as will rapid movements such as:  sneezing coughing laughing rolling over in bed standing up too quickly  Treatment of Round Ligament Pain  Here are some tips that may help reduce your discomfort:  Pain relief. Take over-the-counter acetaminophen for pain, if necessary. Ask your doctor if this is OK.  Exercise. Get plenty of exercise to keep your stomach (core) muscles strong. Doing stretching exercises or prenatal yoga can be helpful. Ask your doctor which exercises are safe for you and your baby.  A helpful exercise involves  putting your hands and knees on the floor, lowering your head, and pushing your backside into the air.  Avoid sudden movements. Change positions slowly (such as standing up or sitting down) to avoid sudden movements that may cause stretching and pain.  Flex your hips. Bend and flex your hips before you cough, sneeze, or laugh to avoid pulling on the ligaments.  Apply warmth. A heating pad or warm bath may be helpful. Ask your doctor if this is OK. Extreme heat can be dangerous to the baby.  You should try to modify your daily activity level and avoid positions that may worsen the condition.  When to Call the Doctor/Midwife  Always tell your doctor or midwife about any type of pain you have during pregnancy. Round ligament pain is quick and doesn't last long.  Call your health care provider immediately if you have:  severe pain fever chills pain on urination difficulty walking  Belly pain during pregnancy can be due to many different causes. It is important for your doctor to rule out more serious conditions, including pregnancy complications such as placenta abruption or non-pregnancy illnesses such as:  inguinal hernia appendicitis stomach, liver, and kidney problems Preterm labor pains may sometimes be mistaken for round ligament pain.  Braxton Hicks Contractions Contractions of the uterus can occur throughout pregnancy. Contractions are not always a sign that you are in labor.  WHAT ARE BRAXTON HICKS CONTRACTIONS?  Contractions that occur before labor are called Braxton Hicks contractions, or false labor. Toward the end of pregnancy,  these contractions can develop more often and may become more forceful. This is not true labor because these contractions do not result in opening (dilatation) and thinning of the cervix. They are sometimes difficult to tell apart from true labor because these contractions can be forceful and people have different pain tolerances. You should not feel  embarrassed if you go to the hospital with false labor. Sometimes, the only way to tell if you are in true labor is for your health care provider to look for changes in the cervix. If there are no prenatal problems or other health problems associated with the pregnancy, it is completely safe to be sent home with false labor and await the onset of true labor. HOW CAN YOU TELL THE DIFFERENCE BETWEEN TRUE AND FALSE LABOR? False Labor  The contractions of false labor are usually shorter and not as hard as those of true labor.   The contractions are usually irregular.   The contractions are often felt in the front of the lower abdomen and in the groin.   The contractions may go away when you walk around or change positions while lying down.   The contractions get weaker and are shorter lasting as time goes on.   The contractions do not usually become progressively stronger, regular, and closer together as with true labor.  True Labor  Contractions in true labor last 30-70 seconds, become very regular, usually become more intense, and increase in frequency.   The contractions do not go away with walking.   The discomfort is usually felt in the top of the uterus and spreads to the lower abdomen and low back.   True labor can be determined by your health care provider with an exam. This will show that the cervix is dilating and getting thinner.  WHAT TO REMEMBER  Keep up with your usual exercises and follow other instructions given by your health care provider.   Take medicines as directed by your health care provider.   Keep your regular prenatal appointments.   Eat and drink lightly if you think you are going into labor.   If Braxton Hicks contractions are making you uncomfortable:   Change your position from lying down or resting to walking, or from walking to resting.   Sit and rest in a tub of warm water.   Drink 2-3 glasses of water. Dehydration may cause these  contractions.   Do slow and deep breathing several times an hour.  WHEN SHOULD I SEEK IMMEDIATE MEDICAL CARE? Seek immediate medical care if:  Your contractions become stronger, more regular, and closer together.   You have fluid leaking or gushing from your vagina.   You have a fever.   You pass blood-tinged mucus.   You have vaginal bleeding.   You have continuous abdominal pain.   You have low back pain that you never had before.   You feel your baby's head pushing down and causing pelvic pressure.   Your baby is not moving as much as it used to.  Document Released: 01/20/2005 Document Revised: 01/25/2013 Document Reviewed: 11/01/2012 [facility] Patient Information 2015 [place], [place]. This information is not intended to replace advice given to you by your health care provider. Make sure you discuss any questions you have with your health care provider.

## 2013-09-27 NOTE — MAU Provider Note (Signed)
History     CSN: [id number]  Arrival date and time: 09/27/13 2048   First Provider Initiated Contact with Patient 09/27/13 2212      No chief complaint on file.  HPI  Paula Cervantes is a 38 y.o. G3P1011 at [redacted]w[redacted]d who presents today with abdominal pain, and tightening. She states that she started to notice the pain when she woke up today,and it has continued all day. She denies any VB or LOF and confirms fetal movement. She denies any complications with this pregnancy, and states that she had had "a lot of ultrasounds", and to the best of her knowledge the findings have been normal.   Past Medical History  Diagnosis Date  . Abnormal Pap smear   . Fibroids   . Endometriosis   . Gout   . Asthma     no episodes/no inhaler needed  . Cervical cancer     Past Surgical History  Procedure Laterality Date  . Ovarian fibroids    . Knee surgery    . No past surgeries    . Cryotherapy    . Diagnostic laparoscopy    . Cesarean section N/A 09/20/2012    Procedure: PRIMARY CESAREAN SECTION;  Surgeon: Margarette Asal, MD;  Location: [facility];  Service: Obstetrics;  Laterality: N/A;  Primary edc 8/25    Family History  Problem Relation Age of Onset  . Alcohol abuse Neg Hx   . Arthritis Neg Hx   . Asthma Neg Hx   . Birth defects Neg Hx   . Cancer Neg Hx   . COPD Neg Hx   . Depression Neg Hx   . Diabetes Neg Hx   . Drug abuse Neg Hx   . Early death Neg Hx   . Hearing loss Neg Hx   . Heart disease Neg Hx   . Hyperlipidemia Neg Hx   . Hypertension Neg Hx   . Kidney disease Neg Hx   . Learning disabilities Neg Hx   . Mental illness Neg Hx   . Mental retardation Neg Hx   . Miscarriages / Stillbirths Neg Hx   . Stroke Neg Hx   . Vision loss Neg Hx   . Varicose Veins Neg Hx     History  Substance Use Topics  . Smoking status: Former Smoker    Quit date: 03/27/2002  . Smokeless tobacco: Never Used  . Alcohol Use: No    Allergies:  Allergies  Allergen Reactions  .  Penicillins Nausea And Vomiting and Other (See Comments)    Caused pt to pass out.   . Sulfa Antibiotics Other (See Comments)    Blisters in mouth and throat  . Aspirin Hives and Other (See Comments)    Causes pt to pass out.  Pt states that she is able to take ibuprofen.    Marland Kitchen Coconut Flavor Hives    Prescriptions prior to admission  Medication Sig Dispense Refill  . acetaminophen (TYLENOL) 500 MG tablet Take 1,000 mg by mouth every 6 (six) hours as needed for mild pain.       Marland Kitchen HYDROcodone-acetaminophen (NORCO/VICODIN) 5-325 MG per tablet Take 1 tablet by mouth every 6 (six) hours as needed for moderate pain.  10 tablet  0  . Prenatal Vit-Fe Fumarate-FA (PRENATAL MULTIVITAMIN) TABS tablet Take 1 tablet by mouth daily at 12 noon.      . lidocaine (LIDODERM) 5 % Place 1 patch onto the skin daily. Remove & Discard patch within 12 hours or  as directed by MD      . promethazine (PHENERGAN) 25 MG tablet Take 1 tablet (25 mg total) by mouth every 6 (six) hours as needed for nausea or vomiting.  30 tablet  1    ROS Physical Exam   Blood pressure 112/60, pulse 97, temperature 98.4 F (36.9 C), temperature source Oral, resp. rate 18, height 5\' 5"  (1.651 m), weight 92.194 kg (203 lb 4 oz), last menstrual period 05/11/2013, unknown if currently breastfeeding.  Physical Exam  Nursing note and vitals reviewed. Constitutional: She is oriented to person, place, and time. She appears well-developed and well-nourished. No distress.  Cardiovascular: Normal rate.   Respiratory: Effort normal.  GI: Soft. There is no tenderness. There is no rebound.  No contractions palpated on abdomen   Genitourinary:   External: no lesion Vagina: small amount of white discharge Cervix: pink, smooth, closed/thick/high/posterior  Uterus: AGA    Neurological: She is alert and oriented to person, place, and time.  Skin: Skin is warm and dry.  Psychiatric: She has a normal mood and affect.    MAU Course   Procedures  Results for orders placed during the hospital encounter of 09/27/13 (from the past 24 hour(s))  URINALYSIS, ROUTINE W REFLEX MICROSCOPIC     Status: None   Collection Time    09/27/13  9:21 PM      Result Value Ref Range   Color, Urine YELLOW  YELLOW   APPearance CLEAR  CLEAR   Specific Gravity, Urine 1.025  1.005 - 1.030   pH 6.0  5.0 - 8.0   Glucose, UA NEGATIVE  NEGATIVE mg/dL   Hgb urine dipstick NEGATIVE  NEGATIVE   Bilirubin Urine NEGATIVE  NEGATIVE   Ketones, ur NEGATIVE  NEGATIVE mg/dL   Protein, ur NEGATIVE  NEGATIVE mg/dL   Urobilinogen, UA 0.2  0.0 - 1.0 mg/dL   Nitrite NEGATIVE  NEGATIVE   Leukocytes, UA NEGATIVE  NEGATIVE  WET PREP, GENITAL     Status: Abnormal   Collection Time    09/27/13 10:22 PM      Result Value Ref Range   Yeast Wet Prep HPF POC NONE SEEN  NONE SEEN   Trich, Wet Prep NONE SEEN  NONE SEEN   Clue Cells Wet Prep HPF POC FEW (*) NONE SEEN   WBC, Wet Prep HPF POC FEW (*) NONE SEEN     Assessment and Plan   1. Round ligament pain    Comfort measures reviewed PTL precautions reviewed Return to MAU as needed  Follow-up Information   Follow up with [place]. (As scheduled)    Contact information:   [address], [place] [address] [phone]       Mathis Bud 09/27/2013, 10:21 PM

## 2013-09-27 NOTE — MAU Note (Signed)
PT  SAYS SHE STARTED HAVING PAIN IN LOWER ABD - STARTED  THIS AM-   WHEN AWOKE .  GETS PNC-  WITH DR Marvel Plan.  NO BLEEDING.  LAST SEX-  8-21.

## 2013-10-29 ENCOUNTER — Encounter (HOSPITAL_COMMUNITY): Payer: Self-pay | Admitting: Emergency Medicine

## 2013-10-29 ENCOUNTER — Emergency Department (HOSPITAL_COMMUNITY): Payer: Medicaid Other

## 2013-10-29 ENCOUNTER — Emergency Department (HOSPITAL_COMMUNITY)
Admission: EM | Admit: 2013-10-29 | Discharge: 2013-10-29 | Disposition: A | Discharge status: Home / Self Care | Payer: Medicaid Other | Attending: Emergency Medicine | Admitting: Emergency Medicine

## 2013-10-29 DIAGNOSIS — Z880 Allergy status to penicillin: Secondary | ICD-10-CM | POA: Insufficient documentation

## 2013-10-29 DIAGNOSIS — G44219 Episodic tension-type headache, not intractable: Secondary | ICD-10-CM | POA: Diagnosis not present

## 2013-10-29 DIAGNOSIS — Z8541 Personal history of malignant neoplasm of cervix uteri: Secondary | ICD-10-CM | POA: Insufficient documentation

## 2013-10-29 DIAGNOSIS — O9989 Other specified diseases and conditions complicating pregnancy, childbirth and the puerperium: Secondary | ICD-10-CM | POA: Diagnosis not present

## 2013-10-29 DIAGNOSIS — J45909 Unspecified asthma, uncomplicated: Secondary | ICD-10-CM | POA: Insufficient documentation

## 2013-10-29 DIAGNOSIS — Z862 Personal history of diseases of the blood and blood-forming organs and certain disorders involving the immune mechanism: Secondary | ICD-10-CM | POA: Insufficient documentation

## 2013-10-29 DIAGNOSIS — Z8742 Personal history of other diseases of the female genital tract: Secondary | ICD-10-CM | POA: Insufficient documentation

## 2013-10-29 DIAGNOSIS — Z8639 Personal history of other endocrine, nutritional and metabolic disease: Secondary | ICD-10-CM | POA: Diagnosis not present

## 2013-10-29 DIAGNOSIS — Z87891 Personal history of nicotine dependence: Secondary | ICD-10-CM | POA: Diagnosis not present

## 2013-10-29 DIAGNOSIS — Z79899 Other long term (current) drug therapy: Secondary | ICD-10-CM | POA: Insufficient documentation

## 2013-10-29 LAB — CBG MONITORING, ED: GLUCOSE-CAPILLARY: 97 mg/dL (ref 70–99)

## 2013-10-29 MED ORDER — ACETAMINOPHEN 325 MG PO TABS
975.0000 mg | ORAL_TABLET | Freq: Once | ORAL | Status: AC
  Administered 2013-10-29: 975 mg via ORAL
  Filled 2013-10-29: qty 3

## 2013-10-29 NOTE — ED Notes (Signed)
Right eye 20/20. Left eye 20/20. Both eyes 20/25.

## 2013-10-29 NOTE — ED Notes (Signed)
CBG 97 

## 2013-10-29 NOTE — ED Provider Notes (Signed)
CSN: [id number]     Arrival date & time 10/29/13  1552 History   First MD Initiated Contact with Patient 10/29/13 1601     Chief Complaint  Patient presents with  . Dizziness  . Headache     (Consider location/radiation/quality/duration/timing/severity/associated sxs/prior Treatment) HPI Patient developed headache frontal in location. gradual onset 3 PM today accompanied by blurred vision and "it feels like my eyes are moving independently of each other." Headache is presently mild. Other associated symptoms and lightheadedness. No treatment prior to coming here. She denies vertigo. Also complains of left ear pain. No treatment prior to coming here. Route by EMS. Patient gets similar headaches approximately 3 times per week however has never had visual changes before.. No other associated symptoms. Admits to "triple vision" and blurred vision. Past Medical History  Diagnosis Date  . Abnormal Pap smear   . Fibroids   . Endometriosis   . Gout   . Asthma     no episodes/no inhaler needed  . Cervical cancer    migraine headaches Past Surgical History  Procedure Laterality Date  . Ovarian fibroids    . Knee surgery    . No past surgeries    . Cryotherapy    . Diagnostic laparoscopy    . Cesarean section N/A 09/20/2012    Procedure: PRIMARY CESAREAN SECTION;  Surgeon: Margarette Asal, MD;  Location: [facility];  Service: Obstetrics;  Laterality: N/A;  Primary edc 8/25   Family History  Problem Relation Age of Onset  . Alcohol abuse Neg Hx   . Arthritis Neg Hx   . Asthma Neg Hx   . Birth defects Neg Hx   . Cancer Neg Hx   . COPD Neg Hx   . Depression Neg Hx   . Diabetes Neg Hx   . Drug abuse Neg Hx   . Early death Neg Hx   . Hearing loss Neg Hx   . Heart disease Neg Hx   . Hyperlipidemia Neg Hx   . Hypertension Neg Hx   . Kidney disease Neg Hx   . Learning disabilities Neg Hx   . Mental illness Neg Hx   . Mental retardation Neg Hx   . Miscarriages / Stillbirths Neg Hx   .  Stroke Neg Hx   . Vision loss Neg Hx   . Varicose Veins Neg Hx    History  Substance Use Topics  . Smoking status: Former Smoker    Quit date: 03/27/2002  . Smokeless tobacco: Never Used  . Alcohol Use: No   OB History   Grav Para Term Preterm Abortions TAB SAB Ect Mult Living   3 1 1  1  1   1      Review of Systems  Constitutional: Negative.   Eyes: Positive for visual disturbance.  Respiratory: Negative.   Cardiovascular: Negative.   Gastrointestinal: Negative.   Genitourinary:       Pregnant. EDC = 02/09/2014. Pregnancy going well  Musculoskeletal: Negative.   Skin: Negative.   Neurological: Positive for light-headedness and headaches.  Psychiatric/Behavioral: Negative.   All other systems reviewed and are negative.     Allergies  Penicillins; Sulfa antibiotics; Aspirin; and Coconut flavor  Home Medications   Prior to Admission medications   Medication Sig Start Date End Date Taking? Authorizing Provider  acetaminophen (TYLENOL) 500 MG tablet Take 1,000 mg by mouth every 6 (six) hours as needed for mild pain.     Historical Provider, MD  HYDROcodone-acetaminophen (NORCO/VICODIN) 5-325  MG per tablet Take 1 tablet by mouth every 6 (six) hours as needed for moderate pain. 08/02/13   Heather Erby Pian, CNM  lidocaine (LIDODERM) 5 % Place 1 patch onto the skin daily. Remove & Discard patch within 12 hours or as directed by MD    Historical Provider, MD  Prenatal Vit-Fe Fumarate-FA (PRENATAL MULTIVITAMIN) TABS tablet Take 1 tablet by mouth daily at 12 noon.    Historical Provider, MD  promethazine (PHENERGAN) 25 MG tablet Take 1 tablet (25 mg total) by mouth every 6 (six) hours as needed for nausea or vomiting. 06/19/13   Manya Silvas, CNM   BP 121/55  Pulse 99  Resp 24  SpO2 95%  LMP 05/11/2013 Physical Exam  Nursing note and vitals reviewed. Constitutional: She is oriented to person, place, and time. She appears well-developed and well-nourished. No distress.   HENT:  Head: Normocephalic and atraumatic.  Eyes: Conjunctivae and EOM are normal. Pupils are equal, round, and reactive to light. Left eye exhibits no discharge.  Fundi benign. Uncover in left eye patient has triple vision with right eye. On covering right eye, she has double vision with left eye  Neck: Neck supple. No tracheal deviation present. No thyromegaly present.  Cardiovascular: Normal rate and regular rhythm.   No murmur heard. Pulmonary/Chest: Effort normal and breath sounds normal.  Abdominal: Soft. Bowel sounds are normal. She exhibits no distension. There is no tenderness.  Gravid fetal heart tones 144  Musculoskeletal: Normal range of motion. She exhibits no edema and no tenderness.  Neurological: She is alert and oriented to person, place, and time. She has normal reflexes. Coordination normal.  Gait normal Romberg normal pronator drift normal. DTRs symmetric bilaterally knee jerk ankle jerk and biceps toes downward going bilaterally  Skin: Skin is warm and dry. No rash noted.  Psychiatric: She has a normal mood and affect.   visual acuity 20/20 right eye 20/20 left eye  ED Course  Procedures (including critical care time) Labs Review Labs Reviewed  CBG MONITORING, ED    Imaging Review No results found.   EKG Interpretation None     5:55 PM patient's headache is improved after treatment with Tylenol. she states her vision is normal states "I'm fine now I think I was just under stress" Results for orders placed during the hospital encounter of 10/29/13  CBG MONITORING, ED      Result Value Ref Range   Glucose-Capillary 97  70 - 99 mg/dL   Mr Brain Wo Contrast  10/29/2013   CLINICAL DATA:  Headaches, dizziness, and blurred vision. Six months pregnant. Symptoms present for 1 day.  EXAM: MRI HEAD WITHOUT CONTRAST  TECHNIQUE: Multiplanar, multiecho pulse sequences of the brain and surrounding structures were obtained without intravenous contrast.  COMPARISON:  Head  CT 04/25/2010  FINDINGS: Mildly prominent, symmetric upper cervical lymph nodes are likely reactive.  There is no acute infarct. Ventricles and sulci are normal for age. There is no evidence of intracranial hemorrhage, mass, midline shift, or extra-axial fluid collection. No brain parenchymal signal abnormality is identified.  Orbits are unremarkable. Minimal right ethmoid air cell mucosal thickening is noted. Mastoid air cells are clear. Major intracranial vascular flow voids are preserved. Calvarium and scalp soft tissues are unremarkable.  IMPRESSION: Unremarkable appearance of the brain.   Electronically Signed   By: Logan Bores   On: 10/29/2013 17:45    MDM  Visual changes felt to be psychogenic Final diagnoses:  None   Plan tylenol  as needed for rdiscomfort.  Keep regularly scheduled visits for prenatal care Dx: Headache     Orlie Dakin, MD 10/29/13 0932

## 2013-10-29 NOTE — Discharge Instructions (Signed)
Headaches, Frequently Asked Questions Your MRI scan today was normal. Take Tylenol as directed for headaches. It is safe to take Tylenol during pregnancy. Follow up with her regularly scheduled prenatal visits MIGRAINE HEADACHES Q: What is migraine? What causes it? How can I treat it? A: Generally, migraine headaches begin as a dull ache. Then they develop into a constant, throbbing, and pulsating pain. You may experience pain at the temples. You may experience pain at the front or back of one or both sides of the head. The pain is usually accompanied by a combination of:  Nausea.  Vomiting.  Sensitivity to light and noise. Some people (about 15%) experience an aura (see below) before an attack. The cause of migraine is believed to be chemical reactions in the brain. Treatment for migraine may include over-the-counter or prescription medications. It may also include self-help techniques. These include relaxation training and biofeedback.  Q: What is an aura? A: About 15% of people with migraine get an "aura". This is a sign of neurological symptoms that occur before a migraine headache. You may see wavy or jagged lines, dots, or flashing lights. You might experience tunnel vision or blind spots in one or both eyes. The aura can include visual or auditory hallucinations (something imagined). It may include disruptions in smell (such as strange odors), taste or touch. Other symptoms include:  Numbness.  A "pins and needles" sensation.  Difficulty in recalling or speaking the correct word. These neurological events may last as long as 60 minutes. These symptoms will fade as the headache begins. Q: What is a trigger? A: Certain physical or environmental factors can lead to or "trigger" a migraine. These include:  Foods.  Hormonal changes.  Weather.  Stress. It is important to remember that triggers are different for everyone. To help prevent migraine attacks, you need to figure out which  triggers affect you. Keep a headache diary. This is a good way to track triggers. The diary will help you talk to your healthcare professional about your condition. Q: Does weather affect migraines? A: Bright sunshine, hot, humid conditions, and drastic changes in barometric pressure may lead to, or "trigger," a migraine attack in some people. But studies have shown that weather does not act as a trigger for everyone with migraines. Q: What is the link between migraine and hormones? A: Hormones start and regulate many of your body's functions. Hormones keep your body in balance within a constantly changing environment. The levels of hormones in your body are unbalanced at times. Examples are during menstruation, pregnancy, or menopause. That can lead to a migraine attack. In fact, about three quarters of all women with migraine report that their attacks are related to the menstrual cycle.  Q: Is there an increased risk of stroke for migraine sufferers? A: The likelihood of a migraine attack causing a stroke is very remote. That is not to say that migraine sufferers cannot have a stroke associated with their migraines. In persons under age 24, the most common associated factor for stroke is migraine headache. But over the course of a person's normal life span, the occurrence of migraine headache may actually be associated with a reduced risk of dying from cerebrovascular disease due to stroke.  Q: What are acute medications for migraine? A: Acute medications are used to treat the pain of the headache after it has started. Examples over-the-counter medications, NSAIDs, ergots, and triptans.  Q: What are the triptans? A: Triptans are the newest class of abortive medications.  They are specifically targeted to treat migraine. Triptans are vasoconstrictors. They moderate some chemical reactions in the brain. The triptans work on receptors in your brain. Triptans help to restore the balance of a neurotransmitter  called serotonin. Fluctuations in levels of serotonin are thought to be a main cause of migraine.  Q: Are over-the-counter medications for migraine effective? A: Over-the-counter, or "OTC," medications may be effective in relieving mild to moderate pain and associated symptoms of migraine. But you should see your caregiver before beginning any treatment regimen for migraine.  Q: What are preventive medications for migraine? A: Preventive medications for migraine are sometimes referred to as "prophylactic" treatments. They are used to reduce the frequency, severity, and length of migraine attacks. Examples of preventive medications include antiepileptic medications, antidepressants, beta-blockers, calcium channel blockers, and NSAIDs (nonsteroidal anti-inflammatory drugs). Q: Why are anticonvulsants used to treat migraine? A: During the past few years, there has been an increased interest in antiepileptic drugs for the prevention of migraine. They are sometimes referred to as "anticonvulsants". Both epilepsy and migraine may be caused by similar reactions in the brain.  Q: Why are antidepressants used to treat migraine? A: Antidepressants are typically used to treat people with depression. They may reduce migraine frequency by regulating chemical levels, such as serotonin, in the brain.  Q: What alternative therapies are used to treat migraine? A: The term "alternative therapies" is often used to describe treatments considered outside the scope of conventional Western medicine. Examples of alternative therapy include acupuncture, acupressure, and yoga. Another common alternative treatment is herbal therapy. Some herbs are believed to relieve headache pain. Always discuss alternative therapies with your caregiver before proceeding. Some herbal products contain arsenic and other toxins. TENSION HEADACHES Q: What is a tension-type headache? What causes it? How can I treat it? A: Tension-type headaches occur  randomly. They are often the result of temporary stress, anxiety, fatigue, or anger. Symptoms include soreness in your temples, a tightening band-like sensation around your head (a "vice-like" ache). Symptoms can also include a pulling feeling, pressure sensations, and contracting head and neck muscles. The headache begins in your forehead, temples, or the back of your head and neck. Treatment for tension-type headache may include over-the-counter or prescription medications. Treatment may also include self-help techniques such as relaxation training and biofeedback. CLUSTER HEADACHES Q: What is a cluster headache? What causes it? How can I treat it? A: Cluster headache gets its name because the attacks come in groups. The pain arrives with little, if any, warning. It is usually on one side of the head. A tearing or bloodshot eye and a runny nose on the same side of the headache may also accompany the pain. Cluster headaches are believed to be caused by chemical reactions in the brain. They have been described as the most severe and intense of any headache type. Treatment for cluster headache includes prescription medication and oxygen. SINUS HEADACHES Q: What is a sinus headache? What causes it? How can I treat it? A: When a cavity in the bones of the face and skull (a sinus) becomes inflamed, the inflammation will cause localized pain. This condition is usually the result of an allergic reaction, a tumor, or an infection. If your headache is caused by a sinus blockage, such as an infection, you will probably have a fever. An x-ray will confirm a sinus blockage. Your caregiver's treatment might include antibiotics for the infection, as well as antihistamines or decongestants.  REBOUND HEADACHES Q: What is a rebound  headache? What causes it? How can I treat it? A: A pattern of taking acute headache medications too often can lead to a condition known as "rebound headache." A pattern of taking too much  headache medication includes taking it more than 2 days per week or in excessive amounts. That means more than the label or a caregiver advises. With rebound headaches, your medications not only stop relieving pain, they actually begin to cause headaches. Doctors treat rebound headache by tapering the medication that is being overused. Sometimes your caregiver will gradually substitute a different type of treatment or medication. Stopping may be a challenge. Regularly overusing a medication increases the potential for serious side effects. Consult a caregiver if you regularly use headache medications more than 2 days per week or more than the label advises. ADDITIONAL QUESTIONS AND ANSWERS Q: What is biofeedback? A: Biofeedback is a self-help treatment. Biofeedback uses special equipment to monitor your body's involuntary physical responses. Biofeedback monitors:  Breathing.  Pulse.  Heart rate.  Temperature.  Muscle tension.  Brain activity. Biofeedback helps you refine and perfect your relaxation exercises. You learn to control the physical responses that are related to stress. Once the technique has been mastered, you do not need the equipment any more. Q: Are headaches hereditary? A: Four out of five (80%) of people that suffer report a family history of migraine. Scientists are not sure if this is genetic or a family predisposition. Despite the uncertainty, a child has a 50% chance of having migraine if one parent suffers. The child has a 75% chance if both parents suffer.  Q: Can children get headaches? A: By the time they reach high school, most young people have experienced some type of headache. Many safe and effective approaches or medications can prevent a headache from occurring or stop it after it has begun.  Q: What type of doctor should I see to diagnose and treat my headache? A: Start with your primary caregiver. Discuss his or her experience and approach to headaches. Discuss  methods of classification, diagnosis, and treatment. Your caregiver may decide to recommend you to a headache specialist, depending upon your symptoms or other physical conditions. Having diabetes, allergies, etc., may require a more comprehensive and inclusive approach to your headache. The National Head[employer] Foundation will provide, upon request, a list of [facility] physician members in your state. Document Released: 04/12/2003 Document Revised: 04/14/2011 Document Reviewed: 09/20/2007 [facility] Patient Information 2015 [place], [place]. This information is not intended to replace advice given to you by your health care provider. Make sure you discuss any questions you have with your health care provider.

## 2013-10-29 NOTE — ED Notes (Signed)
MD at bedside. 

## 2013-10-29 NOTE — ED Notes (Signed)
Pt from home with c/o vision problems and dizziness upon standing. Pt reporting HA, triple vision, and "seeing spots" until arrival to ED. Pt is "6 months" pregnant, with no complications of the pregnancy. NAD noted at this time.

## 2013-11-13 ENCOUNTER — Inpatient Hospital Stay (HOSPITAL_COMMUNITY)
Admission: AD | Admit: 2013-11-13 | Discharge: 2013-11-13 | Disposition: A | Discharge status: Home / Self Care | Payer: Medicaid Other | Source: Ambulatory Visit | Attending: Obstetrics and Gynecology | Admitting: Obstetrics and Gynecology

## 2013-11-13 ENCOUNTER — Encounter (HOSPITAL_COMMUNITY): Payer: Self-pay

## 2013-11-13 DIAGNOSIS — O9989 Other specified diseases and conditions complicating pregnancy, childbirth and the puerperium: Secondary | ICD-10-CM | POA: Diagnosis not present

## 2013-11-13 DIAGNOSIS — R1011 Right upper quadrant pain: Secondary | ICD-10-CM

## 2013-11-13 DIAGNOSIS — Z87891 Personal history of nicotine dependence: Secondary | ICD-10-CM | POA: Insufficient documentation

## 2013-11-13 DIAGNOSIS — R1010 Upper abdominal pain, unspecified: Secondary | ICD-10-CM | POA: Diagnosis present

## 2013-11-13 DIAGNOSIS — O26899 Other specified pregnancy related conditions, unspecified trimester: Secondary | ICD-10-CM

## 2013-11-13 DIAGNOSIS — R1012 Left upper quadrant pain: Secondary | ICD-10-CM

## 2013-11-13 LAB — URINE MICROSCOPIC-ADD ON

## 2013-11-13 LAB — URINALYSIS, ROUTINE W REFLEX MICROSCOPIC
Bilirubin Urine: NEGATIVE
Glucose, UA: NEGATIVE mg/dL
Hgb urine dipstick: NEGATIVE
Ketones, ur: NEGATIVE mg/dL
NITRITE: NEGATIVE
PH: 6 (ref 5.0–8.0)
PROTEIN: NEGATIVE mg/dL
Specific Gravity, Urine: 1.025 (ref 1.005–1.030)
Urobilinogen, UA: 1 mg/dL (ref 0.0–1.0)

## 2013-11-13 MED ORDER — GI COCKTAIL ~~LOC~~
30.0000 mL | Freq: Once | ORAL | Status: AC
  Administered 2013-11-13: 30 mL via ORAL
  Filled 2013-11-13: qty 30

## 2013-11-13 MED ORDER — PANTOPRAZOLE SODIUM 20 MG PO TBEC: 20.0000 mg | DELAYED_RELEASE_TABLET | Freq: Every day | ORAL | Status: DC

## 2013-11-13 NOTE — MAU Note (Signed)
Pt presents complaining of abdominal pain that has been consistent for the past few weeks. States she feels like something is wrong because of the ongoing pain. Pt takes tylenol for the pain but it doesn't help. Denies vaginal bleeding or discharge. Reports good fetal movement and feels like the movement is excessive

## 2013-11-13 NOTE — MAU Provider Note (Signed)
History     CSN: [id number]  Arrival date and time: 11/13/13 1744   None     Chief Complaint  Patient presents with  . Abdominal Pain   HPI This is a 38 y.o. female at [redacted]w[redacted]d who presents with c/o upper abdominal pain for several weeks. Nothing makes it worse or better. Baby moves a lot recently and pt is worried "he is trying to tell me something".   Has occasional "balling up " but that is not responsible for this upper abdominal pain. Denies acid reflux. Denies leaking or bleeding.   RN Note: Pt presents complaining of abdominal pain that has been consistent for the past few weeks. States she feels like something is wrong because of the ongoing pain. Pt takes tylenol for the pain but it doesn't help. Denies vaginal bleeding or discharge. Reports good fetal movement and feels like the movement is excessive        OB History   Grav Para Term Preterm Abortions TAB SAB Ect Mult Living   3 1 1  1  1   1       Past Medical History  Diagnosis Date  . Abnormal Pap smear   . Fibroids   . Endometriosis   . Gout   . Asthma     no episodes/no inhaler needed  . Cervical cancer     Past Surgical History  Procedure Laterality Date  . Ovarian fibroids    . Knee surgery    . No past surgeries    . Cryotherapy    . Diagnostic laparoscopy    . Cesarean section N/A 09/20/2012    Procedure: PRIMARY CESAREAN SECTION;  Surgeon: Margarette Asal, MD;  Location: [facility];  Service: Obstetrics;  Laterality: N/A;  Primary edc 8/25    Family History  Problem Relation Age of Onset  . Alcohol abuse Neg Hx   . Arthritis Neg Hx   . Asthma Neg Hx   . Birth defects Neg Hx   . Cancer Neg Hx   . COPD Neg Hx   . Depression Neg Hx   . Diabetes Neg Hx   . Drug abuse Neg Hx   . Early death Neg Hx   . Hearing loss Neg Hx   . Heart disease Neg Hx   . Hyperlipidemia Neg Hx   . Hypertension Neg Hx   . Kidney disease Neg Hx   . Learning disabilities Neg Hx   . Mental illness Neg Hx   .  Mental retardation Neg Hx   . Miscarriages / Stillbirths Neg Hx   . Stroke Neg Hx   . Vision loss Neg Hx   . Varicose Veins Neg Hx     History  Substance Use Topics  . Smoking status: Former Smoker    Quit date: 03/27/2002  . Smokeless tobacco: Never Used  . Alcohol Use: No    Allergies:  Allergies  Allergen Reactions  . Penicillins Nausea And Vomiting and Other (See Comments)    Pt states that this medication caused her to pass out.    . Sulfa Antibiotics Other (See Comments)    Pt states that this medication causes blisters in mouth and throat.   . Aspirin Hives and Nausea And Vomiting  . Coconut Flavor Hives    Prescriptions prior to admission  Medication Sig Dispense Refill  . acetaminophen (TYLENOL) 500 MG tablet Take 1,000 mg by mouth every 6 (six) hours as needed for mild pain.       Marland Kitchen  calcium carbonate (TUMS EX) 750 MG chewable tablet Chew 1 tablet by mouth 3 (three) times daily as needed for heartburn.      . Prenatal Vit-Fe Fumarate-FA (PRENATAL MULTIVITAMIN) TABS tablet Take 1 tablet by mouth daily.         Review of Systems  Constitutional: Negative for fever, chills and malaise/fatigue.  Gastrointestinal: Positive for abdominal pain (upper ). Negative for nausea, vomiting, diarrhea and constipation.  Genitourinary: Negative for dysuria.   Physical Exam   Blood pressure 128/61, pulse 115, temperature 98.5 F (36.9 C), temperature source Oral, resp. rate 18, last menstrual period 05/11/2013, unknown if currently breastfeeding.  Physical Exam  Constitutional: She is oriented to person, place, and time. She appears well-developed and well-nourished. No distress.  HENT:  Head: Normocephalic.  Cardiovascular: Normal rate.   Respiratory: Effort normal.  GI: Soft. She exhibits no distension and no mass. There is tenderness (mild to moderate tenderness bilateral upper abdomen). There is no rebound and no guarding.  Musculoskeletal: Normal range of motion.   Neurological: She is alert and oriented to person, place, and time.  Skin: Skin is warm and dry.  Psychiatric: She has a normal mood and affect.   FHR reactive No contractions seen on monitor  MAU Course  Procedures  MDM Results for orders placed during the hospital encounter of 11/13/13 (from the past 72 hour(s))  URINALYSIS, ROUTINE W REFLEX MICROSCOPIC     Status: Abnormal   Collection Time    11/13/13  5:50 PM      Result Value Ref Range   Color, Urine YELLOW  YELLOW   APPearance HAZY (*) CLEAR   Specific Gravity, Urine 1.025  1.005 - 1.030   pH 6.0  5.0 - 8.0   Glucose, UA NEGATIVE  NEGATIVE mg/dL   Hgb urine dipstick NEGATIVE  NEGATIVE   Bilirubin Urine NEGATIVE  NEGATIVE   Ketones, ur NEGATIVE  NEGATIVE mg/dL   Protein, ur NEGATIVE  NEGATIVE mg/dL   Urobilinogen, UA 1.0  0.0 - 1.0 mg/dL   Nitrite NEGATIVE  NEGATIVE   Leukocytes, UA TRACE (*) NEGATIVE  URINE MICROSCOPIC-ADD ON     Status: Abnormal   Collection Time    11/13/13  5:50 PM      Result Value Ref Range   Squamous Epithelial / LPF MANY (*) RARE   WBC, UA 7-10  <3 WBC/hpf   RBC / HPF 0-2  <3 RBC/hpf   Bacteria, UA MANY (*) RARE   Urine-Other MUCOUS PRESENT     Will try a GI cocktail  >> Felt better after med No contractions  Assessment and Plan  A:  [facility] at [redacted]w[redacted]d       Upper abdominal pain      Probable acid reflux  P:  Discharge home       Rx. protonix       Followup in office  [facility] 11/13/2013, 6:13 PM

## 2013-11-13 NOTE — Discharge Instructions (Signed)
Abdominal Pain During Pregnancy °Belly (abdominal) pain is common during pregnancy. Most of the time, it is not a serious problem. Other times, it can be a sign that something is wrong with the pregnancy. Always tell your doctor if you have belly pain. °HOME CARE °Monitor your belly pain for any changes. The following actions may help you feel better: °· Do not have sex (intercourse) or put anything in your vagina until you feel better. °· Rest until your pain stops. °· Drink clear fluids if you feel sick to your stomach (nauseous). Do not eat solid food until you feel better. °· Only take medicine as told by your doctor. °· Keep all doctor visits as told. °GET HELP RIGHT AWAY IF:  °· You are bleeding, leaking fluid, or pieces of tissue come out of your vagina. °· You have more pain or cramping. °· You keep throwing up (vomiting). °· You have pain when you pee (urinate) or have blood in your pee. °· You have a fever. °· You do not feel your baby moving as much. °· You feel very weak or feel like passing out. °· You have trouble breathing, with or without belly pain. °· You have a very bad headache and belly pain. °· You have fluid leaking from your vagina and belly pain. °· You keep having watery poop (diarrhea). °· Your belly pain does not go away after resting, or the pain gets worse. °MAKE SURE YOU:  °· Understand these instructions. °· Will watch your condition. °· Will get help right away if you are not doing well or get worse. °Document Released: 01/08/2009 Document Revised: 09/22/2012 Document Reviewed: 08/19/2012 °[employer]® Patient Information ©2015 [employer]. This information is not intended to replace advice given to you by your health care provider. Make sure you discuss any questions you have with your health care provider. ° °

## 2013-12-05 ENCOUNTER — Encounter (HOSPITAL_COMMUNITY): Payer: Self-pay

## 2013-12-23 ENCOUNTER — Encounter (HOSPITAL_COMMUNITY): Payer: Self-pay | Admitting: *Deleted

## 2013-12-23 ENCOUNTER — Inpatient Hospital Stay (HOSPITAL_COMMUNITY): Payer: Medicaid Other

## 2013-12-23 ENCOUNTER — Inpatient Hospital Stay (HOSPITAL_COMMUNITY)
Admission: AD | Admit: 2013-12-23 | Discharge: 2013-12-23 | Disposition: A | Discharge status: Home / Self Care | Payer: Medicaid Other | Source: Ambulatory Visit | Attending: Obstetrics and Gynecology | Admitting: Obstetrics and Gynecology

## 2013-12-23 DIAGNOSIS — Z87891 Personal history of nicotine dependence: Secondary | ICD-10-CM | POA: Diagnosis not present

## 2013-12-23 DIAGNOSIS — Z3A32 32 weeks gestation of pregnancy: Secondary | ICD-10-CM | POA: Insufficient documentation

## 2013-12-23 DIAGNOSIS — R197 Diarrhea, unspecified: Secondary | ICD-10-CM | POA: Insufficient documentation

## 2013-12-23 DIAGNOSIS — O26893 Other specified pregnancy related conditions, third trimester: Secondary | ICD-10-CM | POA: Diagnosis present

## 2013-12-23 DIAGNOSIS — O26899 Other specified pregnancy related conditions, unspecified trimester: Secondary | ICD-10-CM

## 2013-12-23 DIAGNOSIS — R109 Unspecified abdominal pain: Secondary | ICD-10-CM

## 2013-12-23 DIAGNOSIS — A084 Viral intestinal infection, unspecified: Secondary | ICD-10-CM

## 2013-12-23 DIAGNOSIS — R112 Nausea with vomiting, unspecified: Secondary | ICD-10-CM

## 2013-12-23 DIAGNOSIS — R1010 Upper abdominal pain, unspecified: Secondary | ICD-10-CM

## 2013-12-23 LAB — COMPREHENSIVE METABOLIC PANEL
ALT: 5 U/L (ref 0–35)
AST: 8 U/L (ref 0–37)
Albumin: 2.6 g/dL — ABNORMAL LOW (ref 3.5–5.2)
Alkaline Phosphatase: 102 U/L (ref 39–117)
Anion gap: 14 (ref 5–15)
BUN: 9 mg/dL (ref 6–23)
CALCIUM: 8.4 mg/dL (ref 8.4–10.5)
CO2: 17 meq/L — AB (ref 19–32)
Chloride: 106 mEq/L (ref 96–112)
Creatinine, Ser: 0.56 mg/dL (ref 0.50–1.10)
GFR calc Af Amer: >90 mL/min (ref >90)
GFR calc non Af Amer: >90 mL/min (ref >90)
GLUCOSE: 89 mg/dL (ref 70–99)
Potassium: 3.6 mEq/L — ABNORMAL LOW (ref 3.7–5.3)
Sodium: 137 mEq/L (ref 137–147)
Total Bilirubin: 0.2 mg/dL — ABNORMAL LOW (ref 0.3–1.2)
Total Protein: 6.5 g/dL (ref 6.0–8.3)

## 2013-12-23 LAB — CBC
HCT: 34.5 % — ABNORMAL LOW (ref 36.0–46.0)
Hemoglobin: 11.7 g/dL — ABNORMAL LOW (ref 12.0–15.0)
MCH: 30.4 pg (ref 26.0–34.0)
MCHC: 33.9 g/dL (ref 30.0–36.0)
MCV: 89.6 fL (ref 78.0–100.0)
Platelets: 255 10*3/uL (ref 150–400)
RBC: 3.85 MIL/uL — AB (ref 3.87–5.11)
RDW: 12.9 % (ref 11.5–15.5)
WBC: 9.2 10*3/uL (ref 4.0–10.5)

## 2013-12-23 LAB — AMYLASE: Amylase: 3 U/L (ref 0–105)

## 2013-12-23 LAB — LIPASE, BLOOD: LIPASE: 3 U/L — AB (ref 11–59)

## 2013-12-23 MED ORDER — ONDANSETRON 4 MG PO TBDP: 4.0000 mg | ORAL_TABLET | Freq: Three times a day (TID) | ORAL | Status: DC | PRN

## 2013-12-23 MED ORDER — ONDANSETRON HCL 4 MG/2ML IJ SOLN
4.0000 mg | Freq: Once | INTRAMUSCULAR | Status: AC
  Administered 2013-12-23: 4 mg via INTRAVENOUS
  Filled 2013-12-23: qty 2

## 2013-12-23 MED ORDER — DEXTROSE 5 % IN LACTATED RINGERS IV BOLUS
1000.0000 mL | Freq: Once | INTRAVENOUS | Status: AC
  Administered 2013-12-23: 1000 mL via INTRAVENOUS

## 2013-12-23 NOTE — MAU Note (Signed)
Patient states she has had nausea, vomiting and diarrhea all day. Having upper abdominal pain. Denies bleeding or leaking and reports good fetal movement.

## 2013-12-23 NOTE — MAU Provider Note (Signed)
Chief Complaint:  Emesis; Diarrhea; and Abdominal Pain   None     HPI: Paula Cervantes is a 38 y.o. G3P1011 at 36w2dwho presents to maternity admissions reporting n/v/d since this morning.  She reports she ate breakfast, and has been throwing up constantly since then.  She has been unable to eat or drink anything since 9 am this morning.  She denies exposure to illness recently.  She reports pain in her upper abdomen since the vomiting started.  She reports good fetal movement, denies LOF, vaginal bleeding, vaginal itching/burning, urinary symptoms, h/a, dizziness, or fever/chills.     Past Medical History: Past Medical History  Diagnosis Date  . Abnormal Pap smear   . Fibroids   . Endometriosis   . Gout   . Asthma     no episodes/no inhaler needed  . Cervical cancer     Past obstetric history: OB History  Gravida Para Term Preterm AB SAB TAB Ectopic Multiple Living  3 1 1  1 1    1     # Outcome Date GA Lbr Len/2nd Weight Sex Delivery Anes PTL Lv  3 Current           2 Term 09/20/12 [redacted]w[redacted]d  3.25 kg (7 lb 2.6 oz) F CS-LTranv Spinal  Y  1 SAB               Past Surgical History: Past Surgical History  Procedure Laterality Date  . Ovarian fibroids    . Knee surgery    . No past surgeries    . Cryotherapy    . Diagnostic laparoscopy    . Cesarean section N/A 09/20/2012    Procedure: PRIMARY CESAREAN SECTION;  Surgeon: Margarette Asal, MD;  Location: [facility];  Service: Obstetrics;  Laterality: N/A;  Primary edc 8/25    Family History: Family History  Problem Relation Age of Onset  . Alcohol abuse Neg Hx   . Arthritis Neg Hx   . Asthma Neg Hx   . Birth defects Neg Hx   . Cancer Neg Hx   . COPD Neg Hx   . Depression Neg Hx   . Diabetes Neg Hx   . Drug abuse Neg Hx   . Early death Neg Hx   . Hearing loss Neg Hx   . Heart disease Neg Hx   . Hyperlipidemia Neg Hx   . Hypertension Neg Hx   . Kidney disease Neg Hx   . Learning disabilities Neg Hx   . Mental illness  Neg Hx   . Mental retardation Neg Hx   . Miscarriages / Stillbirths Neg Hx   . Stroke Neg Hx   . Vision loss Neg Hx   . Varicose Veins Neg Hx     Social History: History  Substance Use Topics  . Smoking status: Former Smoker    Quit date: 03/27/2002  . Smokeless tobacco: Never Used  . Alcohol Use: No    Allergies:  Allergies  Allergen Reactions  . Penicillins Nausea And Vomiting and Other (See Comments)    Pt states that this medication caused her to pass out.    . Sulfa Antibiotics Other (See Comments)    Pt states that this medication causes blisters in mouth and throat.   . Aspirin Hives and Nausea And Vomiting  . Coconut Flavor Hives    Meds:  Prescriptions prior to admission  Medication Sig Dispense Refill Last Dose  . acetaminophen (TYLENOL) 500 MG tablet Take 1,000 mg by  mouth every 6 (six) hours as needed for mild pain.    Past Month at Unknown time  . calcium carbonate (TUMS EX) 750 MG chewable tablet Chew 1 tablet by mouth 3 (three) times daily as needed for heartburn.   12/22/2013 at Unknown time  . Prenatal Vit-Fe Fumarate-FA (PRENATAL MULTIVITAMIN) TABS tablet Take 1 tablet by mouth daily.    12/23/2013 at Unknown time  . pantoprazole (PROTONIX) 20 MG tablet Take 1 tablet (20 mg total) by mouth daily. (Patient not taking: Reported on 12/23/2013) 30 tablet 0     ROS: Pertinent findings in history of present illness.  Physical Exam  Blood pressure 117/60, pulse 115, temperature 98.8 F (37.1 C), temperature source Oral, resp. rate 16, height 5\' 7"  (1.702 m), weight 93.895 kg (207 lb), last menstrual period 05/11/2013, SpO2 97 %, unknown if currently breastfeeding. GENERAL: Well-developed, well-nourished female in no acute distress.  HEENT: normocephalic HEART: normal rate RESP: normal effort ABDOMEN: Soft, non-tender, gravid appropriate for gestational age EXTREMITIES: Nontender, no edema NEURO: alert and oriented SPECULUM EXAM: NEFG, physiologic  discharge, no blood, cervix clean   Results for orders placed or performed during the hospital encounter of 12/23/13 (from the past 24 hour(s))  CBC     Status: Abnormal   Collection Time: 12/23/13  6:54 PM  Result Value Ref Range   WBC 9.2 4.0 - 10.5 K/uL   RBC 3.85 (L) 3.87 - 5.11 MIL/uL   Hemoglobin 11.7 (L) 12.0 - 15.0 g/dL   HCT 34.5 (L) 36.0 - 46.0 %   MCV 89.6 78.0 - 100.0 fL   MCH 30.4 26.0 - 34.0 pg   MCHC 33.9 30.0 - 36.0 g/dL   RDW 12.9 11.5 - 15.5 %   Platelets 255 150 - 400 K/uL  Comprehensive metabolic panel     Status: Abnormal   Collection Time: 12/23/13  6:54 PM  Result Value Ref Range   Sodium 137 137 - 147 mEq/L   Potassium 3.6 (L) 3.7 - 5.3 mEq/L   Chloride 106 96 - 112 mEq/L   CO2 17 (L) 19 - 32 mEq/L   Glucose, Bld 89 70 - 99 mg/dL   BUN 9 6 - 23 mg/dL   Creatinine, Ser 0.56 0.50 - 1.10 mg/dL   Calcium 8.4 8.4 - 10.5 mg/dL   Total Protein 6.5 6.0 - 8.3 g/dL   Albumin 2.6 (L) 3.5 - 5.2 g/dL   AST 8 0 - 37 U/L   ALT 5 0 - 35 U/L   Alkaline Phosphatase 102 39 - 117 U/L   Total Bilirubin 0.2 (L) 0.3 - 1.2 mg/dL   GFR calc non Af Amer >90 >90 mL/min   GFR calc Af Amer >90 >90 mL/min   Anion gap 14 5 - 15  Amylase     Status: None   Collection Time: 12/23/13  6:54 PM  Result Value Ref Range   Amylase 3 0 - 105 U/L  Lipase, blood     Status: Abnormal   Collection Time: 12/23/13  6:54 PM  Result Value Ref Range   Lipase 3 (L) 11 - 59 U/L   US Abdomen Limited Ruq  12/23/2013   CLINICAL DATA:  Upper abdominal pain. Nausea, vomiting. Thirty-two weeks pregnant.  EXAM: US ABDOMEN LIMITED - RIGHT UPPER QUADRANT  COMPARISON:  None.  FINDINGS: Gallbladder:  No gallstones or wall thickening visualized. No sonographic Murphy sign noted.  Common bile duct:  Diameter: Normal caliber, 3 mm.  Liver:  No focal  lesion identified. Within normal limits in parenchymal echogenicity.  IMPRESSION: Unremarkable right upper quadrant ultrasound.   Electronically Signed   By: Rolm Baptise M.D.   On: 12/23/2013 19:42    FHT:  Baseline 145 , moderate variability, no 15 x 15 accels, 10 x 10's present, variables lasting 15 seconds and 1 prolonged decel x 1 down to 100s. 2211: FHT 130, mdoerate with 15x15 accels, no decels now Toco: no UCs Consult Dr Sandford Craze.  CBC, CMP, amylase, lipase, RUQ ultrasound, NST.  2211: D/W Dr. Melba Coon, ok for dc home with rx for zofran Report to Marcille Buffy, CNM      Follow-up Information    Follow up with Janyth Contes, MD.   Specialty:  Obstetrics and Gynecology   Why:  As scheduled   Contact information:   [address] [place] [address] [phone]     Assessment/Plan  1. Viral gastroenteritis   2. Upper abdominal pain   3. Nausea, vomiting, and diarrhea   4. Abdominal pain affecting pregnancy    Fetal kick counts PTL precautions  BRAT diet Return to MAU as needed     Medication List    TAKE these medications        acetaminophen 500 MG tablet  Commonly known as:  TYLENOL  Take 1,000 mg by mouth every 6 (six) hours as needed for mild pain.     calcium carbonate 750 MG chewable tablet  Commonly known as:  TUMS EX  Chew 1 tablet by mouth 3 (three) times daily as needed for heartburn.     ondansetron 4 MG disintegrating tablet  Commonly known as:  ZOFRAN ODT  Take 1 tablet (4 mg total) by mouth every 8 (eight) hours as needed for nausea or vomiting.     pantoprazole 20 MG tablet  Commonly known as:  PROTONIX  Take 1 tablet (20 mg total) by mouth daily.     prenatal multivitamin Tabs tablet  Take 1 tablet by mouth daily.        Fatima Blank Certified Nurse-Midwife 12/23/2013 10:15 PM

## 2013-12-23 NOTE — Discharge Instructions (Signed)

## 2014-01-04 ENCOUNTER — Inpatient Hospital Stay (HOSPITAL_COMMUNITY)
Admission: AD | Admit: 2014-01-04 | Discharge: 2014-01-04 | Disposition: A | Discharge status: Home / Self Care | Payer: Medicaid Other | Source: Ambulatory Visit | Attending: Obstetrics and Gynecology | Admitting: Obstetrics and Gynecology

## 2014-01-04 ENCOUNTER — Encounter (HOSPITAL_COMMUNITY): Payer: Self-pay | Admitting: *Deleted

## 2014-01-04 DIAGNOSIS — Z87891 Personal history of nicotine dependence: Secondary | ICD-10-CM | POA: Diagnosis not present

## 2014-01-04 DIAGNOSIS — O4703 False labor before 37 completed weeks of gestation, third trimester: Secondary | ICD-10-CM | POA: Diagnosis not present

## 2014-01-04 DIAGNOSIS — Z3A34 34 weeks gestation of pregnancy: Secondary | ICD-10-CM | POA: Insufficient documentation

## 2014-01-04 DIAGNOSIS — O479 False labor, unspecified: Secondary | ICD-10-CM

## 2014-01-04 MED ORDER — LACTATED RINGERS IV BOLUS (SEPSIS)
1000.0000 mL | Freq: Once | INTRAVENOUS | Status: AC
  Administered 2014-01-04: 1000 mL via INTRAVENOUS

## 2014-01-04 MED ORDER — NIFEDIPINE 10 MG PO CAPS
10.0000 mg | ORAL_CAPSULE | Freq: Once | ORAL | Status: AC
  Administered 2014-01-04: 10 mg via ORAL
  Filled 2014-01-04: qty 1

## 2014-01-04 NOTE — MAU Note (Signed)
Sent from [place] office for further evaluation for preterm labor;

## 2014-01-04 NOTE — MAU Note (Signed)
Patient states she was seen in the office today for contractions. Sent to MAU for evaluation of preterm labor. States contractions are every 8 minutes.

## 2014-01-04 NOTE — MAU Note (Signed)
Urine in lab 

## 2014-01-04 NOTE — MAU Provider Note (Signed)
History     CSN: [id number]  Arrival date and time: 01/04/14 1701   First Provider Initiated Contact with Patient 01/04/14 1754      Chief Complaint  Patient presents with  . Labor Eval   HPI   [name] is a 38 y.o. female G3P1011 at [redacted]w[redacted]d who presents to MAU with contractions. She was seen in the office today and sent here for further evaluation of contraction like pain. Her Cervix in the office today was closed. She denies history of preterm labor/ delivery.   OB History    Gravida Para Term Preterm AB TAB SAB Ectopic Multiple Living   3 1 1  1  1   1       Past Medical History  Diagnosis Date  . Abnormal Pap smear   . Fibroids   . Endometriosis   . Gout   . Asthma     no episodes/no inhaler needed  . Cervical cancer     Past Surgical History  Procedure Laterality Date  . Ovarian fibroids    . Knee surgery    . Cryotherapy    . Diagnostic laparoscopy    . Cesarean section N/A 09/20/2012    Procedure: PRIMARY CESAREAN SECTION;  Surgeon: Margarette Asal, MD;  Location: [facility];  Service: Obstetrics;  Laterality: N/A;  Primary edc 8/25    Family History  Problem Relation Age of Onset  . Alcohol abuse Neg Hx   . Arthritis Neg Hx   . Asthma Neg Hx   . Birth defects Neg Hx   . Cancer Neg Hx   . COPD Neg Hx   . Depression Neg Hx   . Diabetes Neg Hx   . Drug abuse Neg Hx   . Early death Neg Hx   . Hearing loss Neg Hx   . Heart disease Neg Hx   . Hyperlipidemia Neg Hx   . Hypertension Neg Hx   . Kidney disease Neg Hx   . Learning disabilities Neg Hx   . Mental illness Neg Hx   . Mental retardation Neg Hx   . Miscarriages / Stillbirths Neg Hx   . Stroke Neg Hx   . Vision loss Neg Hx   . Varicose Veins Neg Hx     History  Substance Use Topics  . Smoking status: Former Smoker    Quit date: 03/27/2002  . Smokeless tobacco: Never Used  . Alcohol Use: No    Allergies:  Allergies  Allergen Reactions  . Penicillins Nausea And Vomiting and  Other (See Comments)    Pt states that this medication caused her to pass out.    . Sulfa Antibiotics Other (See Comments)    Pt states that this medication causes blisters in mouth and throat.   . Aspirin Hives and Nausea And Vomiting  . Coconut Flavor Hives    Prescriptions prior to admission  Medication Sig Dispense Refill Last Dose  . acetaminophen (TYLENOL) 500 MG tablet Take 1,000 mg by mouth every 6 (six) hours as needed for mild pain.    Past Month at Unknown time  . calcium carbonate (TUMS EX) 750 MG chewable tablet Chew 1 tablet by mouth 3 (three) times daily as needed for heartburn.   01/04/2014 at Unknown time  . ondansetron (ZOFRAN ODT) 4 MG disintegrating tablet Take 1 tablet (4 mg total) by mouth every 8 (eight) hours as needed for nausea or vomiting. 20 tablet 0 Past Month at Unknown time  .  Prenatal Vit-Fe Fumarate-FA (PRENATAL MULTIVITAMIN) TABS tablet Take 1 tablet by mouth daily.    01/04/2014 at Unknown time  . pantoprazole (PROTONIX) 20 MG tablet Take 1 tablet (20 mg total) by mouth daily. (Patient not taking: Reported on 12/23/2013) 30 tablet 0    No results found for this or any previous visit (from the past 48 hour(s)).  Review of Systems  Gastrointestinal: Positive for abdominal pain (Lower abdominal pain; feels like contractions ).  Genitourinary: Negative for dysuria, urgency, frequency and hematuria.  Musculoskeletal: Positive for back pain.   Physical Exam   Blood pressure 118/65, pulse 106, temperature 98.4 F (36.9 C), temperature source Oral, resp. rate 16, height 5\' 7"  (1.702 m), weight 95.029 kg (209 lb 8 oz), last menstrual period 05/11/2013, unknown if currently breastfeeding.  Physical Exam  Constitutional: She is oriented to person, place, and time. She appears well-developed and well-nourished. No distress.  HENT:  Head: Normocephalic.  Eyes: Pupils are equal, round, and reactive to light.  Neck: Neck supple.  Respiratory: Effort normal.  GI:  Soft.  Fundus is soft. Unable to palpate contraction   Musculoskeletal: Normal range of motion.  Neurological: She is alert and oriented to person, place, and time.  Skin: Skin is warm. She is not diaphoretic.  Psychiatric: Her behavior is normal.   Fetal Tracing: Baseline: 120 bpm  Variability: Moderate  Accelerations: 15x15 Decelerations: none Toco: Occasional contraction with UI     MAU Course  Procedures  None  MDM 1830: Discussed patient with Dr. Melba Coon LR bolus Procardia times 1 dose 1930: patient denies pain and states that the fluid and procardia helped; Dr. Melba Coon notified.   Assessment and Plan   A:  Braxton hicks contractions  P:  Discharge home in stable condition Preterm labor precautions Follow up with PCP as scheduled Return to MAU as needed, if symptoms worsen Kick counts   Darrelyn Hillock Rasch, NP 01/04/2014 7:40 PM

## 2014-01-04 NOTE — Discharge Instructions (Signed)
Braxton Hicks Contractions °Contractions of the uterus can occur throughout pregnancy. Contractions are not always a sign that you are in labor.  °WHAT ARE BRAXTON HICKS CONTRACTIONS?  °Contractions that occur before labor are called Braxton Hicks contractions, or false labor. Toward the end of pregnancy (32-34 weeks), these contractions can develop more often and may become more forceful. This is not true labor because these contractions do not result in opening (dilatation) and thinning of the cervix. They are sometimes difficult to tell apart from true labor because these contractions can be forceful and people have different pain tolerances. You should not feel embarrassed if you go to the hospital with false labor. Sometimes, the only way to tell if you are in true labor is for your health care provider to look for changes in the cervix. °If there are no prenatal problems or other health problems associated with the pregnancy, it is completely safe to be sent home with false labor and await the onset of true labor. °HOW CAN YOU TELL THE DIFFERENCE BETWEEN TRUE AND FALSE LABOR? °False Labor °· The contractions of false labor are usually shorter and not as hard as those of true labor.   °· The contractions are usually irregular.   °· The contractions are often felt in the front of the lower abdomen and in the groin.   °· The contractions may go away when you walk around or change positions while lying down.   °· The contractions get weaker and are shorter lasting as time goes on.   °· The contractions do not usually become progressively stronger, regular, and closer together as with true labor.   °True Labor °· Contractions in true labor last 30-70 seconds, become very regular, usually become more intense, and increase in frequency.   °· The contractions do not go away with walking.   °· The discomfort is usually felt in the top of the uterus and spreads to the lower abdomen and low back.   °· True labor can be  determined by your health care provider with an exam. This will show that the cervix is dilating and getting thinner.   °WHAT TO REMEMBER °· Keep up with your usual exercises and follow other instructions given by your health care provider.   °· Take medicines as directed by your health care provider.   °· Keep your regular prenatal appointments.   °· Eat and drink lightly if you think you are going into labor.   °· If Braxton Hicks contractions are making you uncomfortable:   °¨ Change your position from lying down or resting to walking, or from walking to resting.   °¨ Sit and rest in a tub of warm water.   °¨ Drink 2-3 glasses of water. Dehydration may cause these contractions.   °¨ Do slow and deep breathing several times an hour.   °WHEN SHOULD I SEEK IMMEDIATE MEDICAL CARE? °Seek immediate medical care if: °· Your contractions become stronger, more regular, and closer together.   °· You have fluid leaking or gushing from your vagina.   °· You have a fever.   °· You pass blood-tinged mucus.   °· You have vaginal bleeding.   °· You have continuous abdominal pain.   °· You have low back pain that you never had before.   °· You feel your baby's head pushing down and causing pelvic pressure.   °· Your baby is not moving as much as it used to.   °Document Released: 01/20/2005 Document Revised: 01/25/2013 Document Reviewed: 11/01/2012 °[employer]® Patient Information ©2015 [employer]. This information is not intended to replace advice given to you by your health care   provider. Make sure you discuss any questions you have with your health care provider. ° °

## 2014-01-04 NOTE — MAU Note (Signed)
Cervix closed per OB's SVE this afternoon; c/o ucs for past 2 days;; denies bleeding or discharge;

## 2014-01-15 ENCOUNTER — Encounter (HOSPITAL_COMMUNITY): Payer: Self-pay

## 2014-01-15 ENCOUNTER — Inpatient Hospital Stay (HOSPITAL_COMMUNITY)
Admission: AD | Admit: 2014-01-15 | Discharge: 2014-01-15 | Disposition: A | Discharge status: Home / Self Care | Payer: Medicaid Other | Source: Ambulatory Visit | Attending: Obstetrics and Gynecology | Admitting: Obstetrics and Gynecology

## 2014-01-15 DIAGNOSIS — Z87891 Personal history of nicotine dependence: Secondary | ICD-10-CM | POA: Insufficient documentation

## 2014-01-15 DIAGNOSIS — Z8541 Personal history of malignant neoplasm of cervix uteri: Secondary | ICD-10-CM | POA: Diagnosis not present

## 2014-01-15 DIAGNOSIS — O99513 Diseases of the respiratory system complicating pregnancy, third trimester: Secondary | ICD-10-CM | POA: Diagnosis not present

## 2014-01-15 DIAGNOSIS — O4703 False labor before 37 completed weeks of gestation, third trimester: Secondary | ICD-10-CM

## 2014-01-15 DIAGNOSIS — O479 False labor, unspecified: Secondary | ICD-10-CM

## 2014-01-15 DIAGNOSIS — J45909 Unspecified asthma, uncomplicated: Secondary | ICD-10-CM | POA: Insufficient documentation

## 2014-01-15 DIAGNOSIS — Z3A35 35 weeks gestation of pregnancy: Secondary | ICD-10-CM | POA: Insufficient documentation

## 2014-01-15 LAB — URINALYSIS, ROUTINE W REFLEX MICROSCOPIC
Bilirubin Urine: NEGATIVE
Glucose, UA: NEGATIVE mg/dL
Hgb urine dipstick: NEGATIVE
KETONES UR: NEGATIVE mg/dL
NITRITE: NEGATIVE
Protein, ur: NEGATIVE mg/dL
Specific Gravity, Urine: 1.01 (ref 1.005–1.030)
UROBILINOGEN UA: 0.2 mg/dL (ref 0.0–1.0)
pH: 6.5 (ref 5.0–8.0)

## 2014-01-15 LAB — URINE MICROSCOPIC-ADD ON

## 2014-01-15 NOTE — Progress Notes (Signed)
Dr. Marvel Plan called to check on pt care and current status. Updated about pt contractions and waiting for UA. Ok to discharge home once results are reviewed with CNM

## 2014-01-15 NOTE — Discharge Instructions (Signed)
Braxton Hicks Contractions °Contractions of the uterus can occur throughout pregnancy. Contractions are not always a sign that you are in labor.  °WHAT ARE BRAXTON HICKS CONTRACTIONS?  °Contractions that occur before labor are called Braxton Hicks contractions, or false labor. Toward the end of pregnancy (32-34 weeks), these contractions can develop more often and may become more forceful. This is not true labor because these contractions do not result in opening (dilatation) and thinning of the cervix. They are sometimes difficult to tell apart from true labor because these contractions can be forceful and people have different pain tolerances. You should not feel embarrassed if you go to the hospital with false labor. Sometimes, the only way to tell if you are in true labor is for your health care provider to look for changes in the cervix. °If there are no prenatal problems or other health problems associated with the pregnancy, it is completely safe to be sent home with false labor and await the onset of true labor. °HOW CAN YOU TELL THE DIFFERENCE BETWEEN TRUE AND FALSE LABOR? °False Labor °· The contractions of false labor are usually shorter and not as hard as those of true labor.   °· The contractions are usually irregular.   °· The contractions are often felt in the front of the lower abdomen and in the groin.   °· The contractions may go away when you walk around or change positions while lying down.   °· The contractions get weaker and are shorter lasting as time goes on.   °· The contractions do not usually become progressively stronger, regular, and closer together as with true labor.   °True Labor °· Contractions in true labor last 30-70 seconds, become very regular, usually become more intense, and increase in frequency.   °· The contractions do not go away with walking.   °· The discomfort is usually felt in the top of the uterus and spreads to the lower abdomen and low back.   °· True labor can be  determined by your health care provider with an exam. This will show that the cervix is dilating and getting thinner.   °WHAT TO REMEMBER °· Keep up with your usual exercises and follow other instructions given by your health care provider.   °· Take medicines as directed by your health care provider.   °· Keep your regular prenatal appointments.   °· Eat and drink lightly if you think you are going into labor.   °· If Braxton Hicks contractions are making you uncomfortable:   °¨ Change your position from lying down or resting to walking, or from walking to resting.   °¨ Sit and rest in a tub of warm water.   °¨ Drink 2-3 glasses of water. Dehydration may cause these contractions.   °¨ Do slow and deep breathing several times an hour.   °WHEN SHOULD I SEEK IMMEDIATE MEDICAL CARE? °Seek immediate medical care if: °· Your contractions become stronger, more regular, and closer together.   °· You have fluid leaking or gushing from your vagina.   °· You have a fever.   °· You pass blood-tinged mucus.   °· You have vaginal bleeding.   °· You have continuous abdominal pain.   °· You have low back pain that you never had before.   °· You feel your baby's head pushing down and causing pelvic pressure.   °· Your baby is not moving as much as it used to.   °Document Released: 01/20/2005 Document Revised: 01/25/2013 Document Reviewed: 11/01/2012 °[employer]® Patient Information ©2015 [employer]. This information is not intended to replace advice given to you by your health care   provider. Make sure you discuss any questions you have with your health care provider. ° °

## 2014-01-15 NOTE — MAU Note (Signed)
Pt presents complaining of contractions that started last night but got worse today. Denies bleeding, leaking of fluid, or discharge. Reports good fetal movement. Scheduled c/s

## 2014-01-15 NOTE — MAU Provider Note (Signed)
History     CSN: [id number]  Arrival date and time: 01/15/14 2012   None     Chief Complaint  Patient presents with  . Contractions   HPI  Paula Cervantes is a 38 y.o. G3P1011 at 35 weeks 4 day who presents today with contractions. She states that she has been having contractions for two days. She denies any VB or LOF and confirms fetal movement. She states that she was checked in the office recently, and was closed. She was also here two weeks ago for a similar concern. She was closed at that time, and she was given procardia at that visit. 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 She is planning a repeat c-section with this pregnancy. No history of preterm labor and delivery.   Past Medical History  Diagnosis Date  . Abnormal Pap smear   . Fibroids   . Endometriosis   . Gout   . Asthma     no episodes/no inhaler needed  . Cervical cancer     Past Surgical History  Procedure Laterality Date  . Ovarian fibroids    . Knee surgery    . Cryotherapy    . Diagnostic laparoscopy    . Cesarean section N/A 09/20/2012    Procedure: PRIMARY CESAREAN SECTION;  Surgeon: Margarette Asal, MD;  Location: [facility];  Service: Obstetrics;  Laterality: N/A;  Primary edc 8/25    Family History  Problem Relation Age of Onset  . Alcohol abuse Neg Hx   . Arthritis Neg Hx   . Asthma Neg Hx   . Birth defects Neg Hx   . Cancer Neg Hx   . COPD Neg Hx   . Depression Neg Hx   . Diabetes Neg Hx   . Drug abuse Neg Hx   . Early death Neg Hx   . Hearing loss Neg Hx   . Heart disease Neg Hx   . Hyperlipidemia Neg Hx   . Hypertension Neg Hx   . Kidney disease Neg Hx   . Learning disabilities Neg Hx   . Mental illness Neg Hx   . Mental retardation Neg Hx   . Miscarriages / Stillbirths Neg Hx   . Stroke Neg Hx   . Vision loss Neg Hx   . Varicose Veins Neg Hx     History   Substance Use Topics  . Smoking status: Former Smoker    Quit date: 03/27/2002  . Smokeless tobacco: Never Used  . Alcohol Use: No    Allergies:  Allergies  Allergen Reactions  . Penicillins Nausea And Vomiting and Other (See Comments)    Pt states that this medication caused her to pass out.    . Sulfa Antibiotics Other (See Comments)    Pt states that this medication causes blisters in mouth and throat.   . Aspirin Hives and Nausea And Vomiting  . Coconut Flavor Hives    Prescriptions prior to admission  Medication Sig Dispense Refill Last Dose  . acetaminophen (TYLENOL) 500 MG tablet Take 1,000 mg by mouth every 6 (six) hours as needed for mild pain.    Past Month at Unknown time  . calcium carbonate (TUMS EX) 750 MG chewable tablet Chew 1 tablet by mouth 3 (three) times daily as needed for heartburn.   01/04/2014 at Unknown time  . ondansetron (ZOFRAN ODT) 4  MG disintegrating tablet Take 1 tablet (4 mg total) by mouth every 8 (eight) hours as needed for nausea or vomiting. 20 tablet 0 Past Month at Unknown time  . Prenatal Vit-Fe Fumarate-FA (PRENATAL MULTIVITAMIN) TABS tablet Take 1 tablet by mouth daily.    01/04/2014 at Unknown time    ROS Physical Exam   Blood pressure 126/70, pulse 106, temperature 98.4 F (36.9 C), temperature source Oral, resp. rate 18, last menstrual period 05/11/2013, unknown if currently breastfeeding.  Physical Exam  Nursing note and vitals reviewed. Constitutional: She is oriented to person, place, and time. She appears well-developed and well-nourished. No distress.  Cardiovascular: Normal rate.   Respiratory: Effort normal.  GI: Soft. There is no tenderness. There is no rebound.  Genitourinary:   Cervix: closed/thick/firm/posterior/-2  Neurological: She is alert and oriented to person, place, and time.  Skin: Skin is warm and dry.  Psychiatric: She has a normal mood and affect.   FHT 135, moderate with 15x15 accels, no decels Toco:  irregualar, about every 4-8 mins  MAU Course  Procedures  Results for orders placed or performed during the hospital encounter of 01/15/14 (from the past 24 hour(s))  Urinalysis, Routine w reflex microscopic     Status: Abnormal   Collection Time: 01/15/14  8:30 PM  Result Value Ref Range   Color, Urine YELLOW YELLOW   APPearance CLEAR CLEAR   Specific Gravity, Urine 1.010 1.005 - 1.030   pH 6.5 5.0 - 8.0   Glucose, UA NEGATIVE NEGATIVE mg/dL   Hgb urine dipstick NEGATIVE NEGATIVE   Bilirubin Urine NEGATIVE NEGATIVE   Ketones, ur NEGATIVE NEGATIVE mg/dL   Protein, ur NEGATIVE NEGATIVE mg/dL   Urobilinogen, UA 0.2 0.0 - 1.0 mg/dL   Nitrite NEGATIVE NEGATIVE   Leukocytes, UA TRACE (A) NEGATIVE  Urine microscopic-add on     Status: Abnormal   Collection Time: 01/15/14  8:30 PM  Result Value Ref Range   Squamous Epithelial / LPF FEW (A) RARE   WBC, UA 0-2 <3 WBC/hpf   RBC / HPF 0-2 <3 RBC/hpf   Bacteria, UA FEW (A) RARE     2226: Dr. Marvel Plan called. If UA is normal patient may be dc home. Assessment and Plan   1. Braxton Hick's contraction    PTL precautions  Fetal kick counts Return to MAU as needed  Follow-up Information    Follow up with Logan Bores, MD.   Specialty:  Obstetrics and Gynecology   Why:  As scheduled   Contact information:   [address] [place] [address] [phone]       Mathis Bud 01/15/2014, 8:27 PM

## 2014-01-28 ENCOUNTER — Encounter (HOSPITAL_COMMUNITY): Payer: Self-pay

## 2014-01-28 ENCOUNTER — Inpatient Hospital Stay (HOSPITAL_COMMUNITY)
Admission: AD | Admit: 2014-01-28 | Discharge: 2014-01-28 | Disposition: A | Discharge status: Home / Self Care | Payer: Medicaid Other | Source: Ambulatory Visit | Attending: Obstetrics and Gynecology | Admitting: Obstetrics and Gynecology

## 2014-01-28 DIAGNOSIS — Z3A38 38 weeks gestation of pregnancy: Secondary | ICD-10-CM | POA: Insufficient documentation

## 2014-01-28 DIAGNOSIS — O471 False labor at or after 37 completed weeks of gestation: Secondary | ICD-10-CM | POA: Diagnosis not present

## 2014-01-28 LAB — URINALYSIS, ROUTINE W REFLEX MICROSCOPIC
Bilirubin Urine: NEGATIVE
Glucose, UA: NEGATIVE mg/dL
Ketones, ur: NEGATIVE mg/dL
NITRITE: NEGATIVE
Protein, ur: NEGATIVE mg/dL
SPECIFIC GRAVITY, URINE: 1.025 (ref 1.005–1.030)
Urobilinogen, UA: 0.2 mg/dL (ref 0.0–1.0)
pH: 6.5 (ref 5.0–8.0)

## 2014-01-28 LAB — URINE MICROSCOPIC-ADD ON

## 2014-01-28 NOTE — Progress Notes (Signed)
Dr Marvel Plan notified of pt's complaints, VE, and FHR orders received to discharge home

## 2014-01-28 NOTE — MAU Note (Signed)
Pt states here for contractions q5 minutes apart. Denies bleeding or lof. Has not been dilated.

## 2014-01-28 NOTE — Discharge Instructions (Signed)

## 2014-02-03 HISTORY — PX: TUBAL LIGATION: SHX77

## 2014-02-07 ENCOUNTER — Encounter (HOSPITAL_COMMUNITY): Payer: Self-pay

## 2014-02-07 ENCOUNTER — Encounter (HOSPITAL_COMMUNITY)
Admission: RE | Admit: 2014-02-07 | Discharge: 2014-02-07 | Disposition: A | Discharge status: Home / Self Care | Payer: Medicaid Other | Source: Ambulatory Visit | Attending: Obstetrics and Gynecology | Admitting: Obstetrics and Gynecology

## 2014-02-07 HISTORY — DX: Other specified pregnancy related conditions, unspecified trimester: O26.899

## 2014-02-07 HISTORY — DX: Lactose intolerance, unspecified: E73.9

## 2014-02-07 HISTORY — DX: Other specified pregnancy related conditions, unspecified trimester: R12

## 2014-02-07 LAB — CBC
HCT: 33 % — ABNORMAL LOW (ref 36.0–46.0)
Hemoglobin: 11.1 g/dL — ABNORMAL LOW (ref 12.0–15.0)
MCH: 30.3 pg (ref 26.0–34.0)
MCHC: 33.6 g/dL (ref 30.0–36.0)
MCV: 90.2 fL (ref 78.0–100.0)
PLATELETS: 238 10*3/uL (ref 150–400)
RBC: 3.66 MIL/uL — ABNORMAL LOW (ref 3.87–5.11)
RDW: 13.2 % (ref 11.5–15.5)
WBC: 6.1 10*3/uL (ref 4.0–10.5)

## 2014-02-07 LAB — TYPE AND SCREEN
ABO/RH(D): A POS
Antibody Screen: NEGATIVE

## 2014-02-07 LAB — RPR

## 2014-02-07 NOTE — Patient Instructions (Signed)
Your procedure is scheduled on:02/09/14  Enter through the Main Entrance at : [facility] desk phone and dial [phone] and inform us of your arrival.  Please call [phone] if you have any problems the morning of surgery.  Remember: Do not eat food or drink liquids, including water, after midnight:WED   You may brush your teeth the morning of surgery.  Take these meds the morning of surgery with a sip of water:Tums if needed  DO NOT wear jewelry, eye make-up, lipstick,body lotion, or dark fingernail polish.  (Polished toes are ok) You may wear deodorant.  If you are to be admitted after surgery, leave suitcase in car until your room has been assigned. Patients discharged on the day of surgery will not be allowed to drive home. Wear loose fitting, comfortable clothes for your ride home.

## 2014-02-08 MED ORDER — DEXTROSE 5 % IV SOLN
INTRAVENOUS | Status: AC
  Administered 2014-02-09: 100 mL via INTRAVENOUS
  Filled 2014-02-08: qty 9.5

## 2014-02-08 NOTE — H&P (Signed)
Jayleena Stille is a 39 y.o. female G3P1011 at 38 weeks (ED 02/16/14 by LMP c/w 8 week [place]) presenting for repeat c-section and tubal sterilization. 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  AMA with normal Panorama and AFP testing. She is GBS positive but allergic to PCN, the GBS is clindamycin sensitive.  The patient expresses a desire for permanent sterilization after careful consideration.   Maternal Medical History:  Contractions: Frequency: rare.   Perceived severity is mild.    Fetal activity: Perceived fetal activity is normal.    Prenatal Complications - Diabetes: none.    OB History    Gravida Para Term Preterm AB TAB SAB Ectopic Multiple Living   3 1 1  1  1   1     2014 LTCS 7#2oz 2015 SAB x 1  Past Medical History  Diagnosis Date  . Abnormal Pap smear   . Fibroids   . Endometriosis   . Gout   . Asthma     no episodes/no inhaler needed  . Cervical cancer   . Heartburn during pregnancy   . Lactose intolerance    Past Surgical History  Procedure Laterality Date  . Ovarian fibroids    . Knee surgery    . Cryotherapy    . Diagnostic laparoscopy    . Cesarean section N/A 09/20/2012    Procedure: PRIMARY CESAREAN SECTION;  Surgeon: Margarette Asal, MD;  Location: [facility];  Service: Obstetrics;  Laterality: N/A;  Primary edc 8/25   Family History: family history is negative for Alcohol abuse, Arthritis, Asthma, Birth defects, Cancer, COPD, Depression, Diabetes, Drug abuse, Early death, Hearing loss, Heart disease, Hyperlipidemia, Hypertension, Kidney disease, Learning disabilities, Mental illness, Mental retardation, Miscarriages / Stillbirths, Stroke, Vision loss, and Varicose Veins. Social History:  reports that she quit smoking about 11 years ago. She has never used smokeless tobacco. She reports that she does not drink alcohol or use illicit drugs.   Prenatal Transfer Tool  Maternal  Diabetes: No Genetic Screening: Normal Maternal Ultrasounds/Referrals: Normal Fetal Ultrasounds or other Referrals:  None Maternal Substance Abuse:  No Significant Maternal Medications:  None Significant Maternal Lab Results:  Lab values include: Group B Strep positive Other Comments:  h/o HSV noted in last pregnancy notes.  Pt did not report  Review of Systems  Neurological: Positive for headaches.      Last menstrual period 05/11/2013, unknown if currently breastfeeding. Maternal Exam:  Uterine Assessment: Contraction strength is mild.  Contraction frequency is rare.   Abdomen: Patient reports no abdominal tenderness. Fetal presentation: vertex  Introitus: Normal vulva. Normal vagina.    Physical Exam  Constitutional: She is oriented to person, place, and time. She appears well-developed and well-nourished.  Cardiovascular: Normal rate and regular rhythm.   Respiratory: Effort normal and breath sounds normal.  GI: Soft.  Genitourinary: Vagina normal.  Gravid  Neurological: She is alert and oriented to person, place, and time.  Psychiatric: She has a normal mood and affect.    Prenatal labs: ABO, Rh: --/--/A POS (01/05 5597) Antibody: NEG (01/05 0925) Rubella: Immune (06/18 0000) RPR: NON REAC (01/05 0925)  HBsAg: Negative (06/18 0000)  HIV: Non-reactive (06/18 0000)  GBS:   Positive--sensitive to clindamycin Panorama WNL AFP WNL One hour GTT 141 Three hour GTT 82/181/153/68  Assessment/Plan: Patient was counseled regarding the risks and benefits of C-section and the procedure was reviewed in detail. Risks of bleeding, infection,  and possible damage to bowel and bladder were reviewed, The patient would accept a blood transfusion if needed. She desires to proceed. The patient has expressed a desire for permanent sterility. We discussed removal of the distal fallopian tubes at the time of her C-section for permanent sterility and as a means of potentially reducing future  risk of ovarian cancer. A risk of failure was quoted as 1/100 or less. The patient understands this would not be reversible and has no future wish for child-bearing. She desires to proceed with this at the time of C-section.   Logan Bores 02/08/2014, 8:05 AM

## 2014-02-09 ENCOUNTER — Encounter (HOSPITAL_COMMUNITY)
Admission: RE | Disposition: A | Discharge status: Home / Self Care | Payer: Self-pay | Source: Ambulatory Visit | Attending: Obstetrics and Gynecology

## 2014-02-09 ENCOUNTER — Inpatient Hospital Stay (HOSPITAL_COMMUNITY): Payer: Medicaid Other | Admitting: Anesthesiology

## 2014-02-09 ENCOUNTER — Inpatient Hospital Stay (HOSPITAL_COMMUNITY)
Admission: RE | Admit: 2014-02-09 | Discharge: 2014-02-11 | DRG: 766 | Disposition: A | Discharge status: Home / Self Care | Payer: Medicaid Other | Source: Ambulatory Visit | Attending: Obstetrics and Gynecology | Admitting: Obstetrics and Gynecology

## 2014-02-09 ENCOUNTER — Encounter (HOSPITAL_COMMUNITY): Payer: Self-pay | Admitting: Anesthesiology

## 2014-02-09 DIAGNOSIS — O34219 Maternal care for unspecified type scar from previous cesarean delivery: Secondary | ICD-10-CM

## 2014-02-09 DIAGNOSIS — O09523 Supervision of elderly multigravida, third trimester: Secondary | ICD-10-CM | POA: Diagnosis not present

## 2014-02-09 DIAGNOSIS — Z87891 Personal history of nicotine dependence: Secondary | ICD-10-CM | POA: Diagnosis not present

## 2014-02-09 DIAGNOSIS — Z880 Allergy status to penicillin: Secondary | ICD-10-CM | POA: Diagnosis not present

## 2014-02-09 DIAGNOSIS — O3421 Maternal care for scar from previous cesarean delivery: Secondary | ICD-10-CM | POA: Diagnosis present

## 2014-02-09 DIAGNOSIS — Z302 Encounter for sterilization: Secondary | ICD-10-CM | POA: Diagnosis not present

## 2014-02-09 DIAGNOSIS — Z98891 History of uterine scar from previous surgery: Secondary | ICD-10-CM

## 2014-02-09 DIAGNOSIS — O99824 Streptococcus B carrier state complicating childbirth: Secondary | ICD-10-CM | POA: Diagnosis present

## 2014-02-09 DIAGNOSIS — Z3A39 39 weeks gestation of pregnancy: Secondary | ICD-10-CM | POA: Diagnosis present

## 2014-02-09 SURGERY — Surgical Case
Anesthesia: Spinal | Site: Abdomen | Laterality: Bilateral

## 2014-02-09 MED ORDER — FENTANYL CITRATE 0.05 MG/ML IJ SOLN
INTRAMUSCULAR | Status: AC
  Filled 2014-02-09: qty 2

## 2014-02-09 MED ORDER — NALBUPHINE HCL 10 MG/ML IJ SOLN: 5.0000 mg | INTRAMUSCULAR | Status: DC | PRN

## 2014-02-09 MED ORDER — DIPHENHYDRAMINE HCL 25 MG PO CAPS: 25.0000 mg | ORAL_CAPSULE | Freq: Four times a day (QID) | ORAL | Status: DC | PRN

## 2014-02-09 MED ORDER — NALOXONE HCL 1 MG/ML IJ SOLN
1.0000 ug/kg/h | INTRAVENOUS | Status: DC | PRN
  Filled 2014-02-09: qty 2

## 2014-02-09 MED ORDER — NALBUPHINE HCL 10 MG/ML IJ SOLN: 5.0000 mg | Freq: Once | INTRAMUSCULAR | Status: AC | PRN

## 2014-02-09 MED ORDER — NALBUPHINE HCL 10 MG/ML IJ SOLN
INTRAMUSCULAR | Status: AC
Start: 2014-02-09 — End: 2014-02-09
  Filled 2014-02-09: qty 1

## 2014-02-09 MED ORDER — IBUPROFEN 600 MG PO TABS
600.0000 mg | ORAL_TABLET | Freq: Four times a day (QID) | ORAL | Status: DC
  Administered 2014-02-09 – 2014-02-11 (×8): 600 mg via ORAL
  Filled 2014-02-09 (×8): qty 1

## 2014-02-09 MED ORDER — SENNOSIDES-DOCUSATE SODIUM 8.6-50 MG PO TABS
2.0000 | ORAL_TABLET | ORAL | Status: DC
  Administered 2014-02-09 – 2014-02-10 (×2): 2 via ORAL
  Filled 2014-02-09 (×2): qty 2

## 2014-02-09 MED ORDER — SODIUM CHLORIDE 0.9 % IJ SOLN: 3.0000 mL | INTRAMUSCULAR | Status: DC | PRN

## 2014-02-09 MED ORDER — LANOLIN HYDROUS EX OINT: 1.0000 "application " | TOPICAL_OINTMENT | CUTANEOUS | Status: DC | PRN

## 2014-02-09 MED ORDER — KETOROLAC TROMETHAMINE 30 MG/ML IJ SOLN
30.0000 mg | Freq: Once | INTRAMUSCULAR | Status: AC
  Administered 2014-02-09: 30 mg via INTRAMUSCULAR

## 2014-02-09 MED ORDER — SCOPOLAMINE 1 MG/3DAYS TD PT72
1.0000 | MEDICATED_PATCH | Freq: Once | TRANSDERMAL | Status: DC
Start: 2014-02-09 — End: 2014-02-09
  Administered 2014-02-09: 1.5 mg via TRANSDERMAL

## 2014-02-09 MED ORDER — LACTATED RINGERS IV SOLN
Freq: Once | INTRAVENOUS | Status: AC
  Administered 2014-02-09: 06:00:00 via INTRAVENOUS

## 2014-02-09 MED ORDER — PHENYLEPHRINE 8 MG IN D5W 100 ML (0.08MG/ML) PREMIX OPTIME
INJECTION | INTRAVENOUS | Status: AC
  Filled 2014-02-09: qty 100

## 2014-02-09 MED ORDER — SCOPOLAMINE 1 MG/3DAYS TD PT72
MEDICATED_PATCH | TRANSDERMAL | Status: AC
  Filled 2014-02-09: qty 1

## 2014-02-09 MED ORDER — MEPERIDINE HCL 25 MG/ML IJ SOLN: 6.2500 mg | INTRAMUSCULAR | Status: DC | PRN

## 2014-02-09 MED ORDER — 0.9 % SODIUM CHLORIDE (POUR BTL) OPTIME
TOPICAL | Status: DC | PRN
  Administered 2014-02-09: 1000 mL

## 2014-02-09 MED ORDER — FENTANYL CITRATE 0.05 MG/ML IJ SOLN
INTRAMUSCULAR | Status: DC | PRN
  Administered 2014-02-09: 25 ug via INTRATHECAL

## 2014-02-09 MED ORDER — ONDANSETRON HCL 4 MG/2ML IJ SOLN
INTRAMUSCULAR | Status: DC | PRN
  Administered 2014-02-09: 4 mg via INTRAVENOUS

## 2014-02-09 MED ORDER — MORPHINE SULFATE 0.5 MG/ML IJ SOLN
INTRAMUSCULAR | Status: AC
  Filled 2014-02-09: qty 10

## 2014-02-09 MED ORDER — DIBUCAINE 1 % RE OINT: 1.0000 "application " | TOPICAL_OINTMENT | RECTAL | Status: DC | PRN

## 2014-02-09 MED ORDER — ONDANSETRON HCL 4 MG/2ML IJ SOLN
INTRAMUSCULAR | Status: AC
  Filled 2014-02-09: qty 2

## 2014-02-09 MED ORDER — LACTATED RINGERS IV SOLN
INTRAVENOUS | Status: DC
  Administered 2014-02-09: 08:00:00 via INTRAVENOUS

## 2014-02-09 MED ORDER — ONDANSETRON HCL 4 MG/2ML IJ SOLN: 4.0000 mg | Freq: Three times a day (TID) | INTRAMUSCULAR | Status: DC | PRN

## 2014-02-09 MED ORDER — TETANUS-DIPHTH-ACELL PERTUSSIS 5-2.5-18.5 LF-MCG/0.5 IM SUSP: 0.5000 mL | Freq: Once | INTRAMUSCULAR | Status: DC

## 2014-02-09 MED ORDER — OXYTOCIN 10 UNIT/ML IJ SOLN
INTRAMUSCULAR | Status: AC
  Filled 2014-02-09: qty 4

## 2014-02-09 MED ORDER — OXYTOCIN 40 UNITS IN LACTATED RINGERS INFUSION - SIMPLE MED: 62.5000 mL/h | INTRAVENOUS | Status: AC

## 2014-02-09 MED ORDER — DIPHENHYDRAMINE HCL 25 MG PO CAPS: 25.0000 mg | ORAL_CAPSULE | ORAL | Status: DC | PRN

## 2014-02-09 MED ORDER — PHENYLEPHRINE 8 MG IN D5W 100 ML (0.08MG/ML) PREMIX OPTIME
INJECTION | INTRAVENOUS | Status: DC | PRN
  Administered 2014-02-09: 60 ug/min via INTRAVENOUS

## 2014-02-09 MED ORDER — ZOLPIDEM TARTRATE 5 MG PO TABS: 5.0000 mg | ORAL_TABLET | Freq: Every evening | ORAL | Status: DC | PRN

## 2014-02-09 MED ORDER — ONDANSETRON HCL 4 MG PO TABS: 4.0000 mg | ORAL_TABLET | ORAL | Status: DC | PRN

## 2014-02-09 MED ORDER — WITCH HAZEL-GLYCERIN EX PADS: 1.0000 "application " | MEDICATED_PAD | CUTANEOUS | Status: DC | PRN

## 2014-02-09 MED ORDER — SCOPOLAMINE 1 MG/3DAYS TD PT72
MEDICATED_PATCH | TRANSDERMAL | Status: AC
  Administered 2014-02-09: 1.5 mg via TRANSDERMAL
  Filled 2014-02-09: qty 1

## 2014-02-09 MED ORDER — ONDANSETRON HCL 4 MG/2ML IJ SOLN: 4.0000 mg | INTRAMUSCULAR | Status: DC | PRN

## 2014-02-09 MED ORDER — SIMETHICONE 80 MG PO CHEW
80.0000 mg | CHEWABLE_TABLET | Freq: Three times a day (TID) | ORAL | Status: DC
  Administered 2014-02-09 – 2014-02-11 (×5): 80 mg via ORAL
  Filled 2014-02-09 (×5): qty 1

## 2014-02-09 MED ORDER — SIMETHICONE 80 MG PO CHEW: 80.0000 mg | CHEWABLE_TABLET | ORAL | Status: DC | PRN

## 2014-02-09 MED ORDER — BUPIVACAINE IN DEXTROSE 0.75-8.25 % IT SOLN
INTRATHECAL | Status: DC | PRN
  Administered 2014-02-09: 1.6 mL via INTRATHECAL

## 2014-02-09 MED ORDER — SCOPOLAMINE 1 MG/3DAYS TD PT72
1.0000 | MEDICATED_PATCH | Freq: Once | TRANSDERMAL | Status: DC
  Filled 2014-02-09: qty 1

## 2014-02-09 MED ORDER — NALBUPHINE HCL 10 MG/ML IJ SOLN
5.0000 mg | INTRAMUSCULAR | Status: DC | PRN
  Administered 2014-02-09: 5 mg via SUBCUTANEOUS

## 2014-02-09 MED ORDER — PRENATAL MULTIVITAMIN CH
1.0000 | ORAL_TABLET | Freq: Every day | ORAL | Status: DC
  Administered 2014-02-10 – 2014-02-11 (×2): 1 via ORAL
  Filled 2014-02-09 (×2): qty 1

## 2014-02-09 MED ORDER — OXYCODONE-ACETAMINOPHEN 5-325 MG PO TABS
1.0000 | ORAL_TABLET | ORAL | Status: DC | PRN
  Administered 2014-02-10 (×2): 1 via ORAL
  Filled 2014-02-09 (×3): qty 1

## 2014-02-09 MED ORDER — SIMETHICONE 80 MG PO CHEW
80.0000 mg | CHEWABLE_TABLET | ORAL | Status: DC
  Administered 2014-02-09 – 2014-02-10 (×2): 80 mg via ORAL
  Filled 2014-02-09 (×2): qty 1

## 2014-02-09 MED ORDER — OXYCODONE-ACETAMINOPHEN 5-325 MG PO TABS
2.0000 | ORAL_TABLET | ORAL | Status: DC | PRN
  Administered 2014-02-10 – 2014-02-11 (×3): 2 via ORAL
  Filled 2014-02-09 (×3): qty 2

## 2014-02-09 MED ORDER — KETOROLAC TROMETHAMINE 30 MG/ML IJ SOLN
INTRAMUSCULAR | Status: AC
  Filled 2014-02-09: qty 1

## 2014-02-09 MED ORDER — LACTATED RINGERS IV SOLN: INTRAVENOUS | Status: DC

## 2014-02-09 MED ORDER — MENTHOL 3 MG MT LOZG: 1.0000 | LOZENGE | OROMUCOSAL | Status: DC | PRN

## 2014-02-09 MED ORDER — OXYTOCIN 10 UNIT/ML IJ SOLN
40.0000 [IU] | INTRAMUSCULAR | Status: DC | PRN
  Administered 2014-02-09: 40 [IU] via INTRAVENOUS

## 2014-02-09 MED ORDER — MORPHINE SULFATE (PF) 0.5 MG/ML IJ SOLN
INTRAMUSCULAR | Status: DC | PRN
  Administered 2014-02-09: .15 mg via INTRATHECAL

## 2014-02-09 MED ORDER — NALOXONE HCL 0.4 MG/ML IJ SOLN: 0.4000 mg | INTRAMUSCULAR | Status: DC | PRN

## 2014-02-09 MED ORDER — DIPHENHYDRAMINE HCL 50 MG/ML IJ SOLN: 12.5000 mg | INTRAMUSCULAR | Status: DC | PRN

## 2014-02-09 MED ORDER — PROMETHAZINE HCL 25 MG/ML IJ SOLN: 6.2500 mg | INTRAMUSCULAR | Status: DC | PRN

## 2014-02-09 MED ORDER — LACTATED RINGERS IV SOLN
INTRAVENOUS | Status: DC | PRN
  Administered 2014-02-09 (×3): via INTRAVENOUS

## 2014-02-09 MED ORDER — HYDROMORPHONE HCL 1 MG/ML IJ SOLN: 0.2500 mg | INTRAMUSCULAR | Status: DC | PRN

## 2014-02-09 SURGICAL SUPPLY — 37 items
APL SKNCLS STERI-STRIP NONHPOA (GAUZE/BANDAGES/DRESSINGS) ×1
BENZOIN TINCTURE PRP APPL 2/3 (GAUZE/BANDAGES/DRESSINGS) ×1 IMPLANT
CLOTH BEACON ORANGE TIMEOUT ST (SAFETY) ×2 IMPLANT
CONTAINER PREFILL 10% NBF 15ML (MISCELLANEOUS) ×2 IMPLANT
DRAPE SHEET LG 3/4 BI-LAMINATE (DRAPES) IMPLANT
DRSG OPSITE POSTOP 4X10 (GAUZE/BANDAGES/DRESSINGS) ×2 IMPLANT
DURAPREP 26ML APPLICATOR (WOUND CARE) ×2 IMPLANT
ELECT REM PT RETURN 9FT ADLT (ELECTROSURGICAL) ×2
ELECTRODE REM PT RTRN 9FT ADLT (ELECTROSURGICAL) ×1 IMPLANT
EXTRACTOR VACUUM KIWI (MISCELLANEOUS) ×1 IMPLANT
GLOVE BIO SURGEON STRL SZ 6.5 (GLOVE) ×2 IMPLANT
GLOVE BIOGEL PI IND STRL 7.0 (GLOVE) IMPLANT
GLOVE BIOGEL PI IND STRL 7.5 (GLOVE) IMPLANT
GLOVE BIOGEL PI INDICATOR 7.0 (GLOVE) ×3
GLOVE BIOGEL PI INDICATOR 7.5 (GLOVE) ×1
GLOVE SURG SS PI 7.0 STRL IVOR (GLOVE) ×1 IMPLANT
GOWN STRL REUS W/TWL LRG LVL3 (GOWN DISPOSABLE) ×4 IMPLANT
KIT ABG SYR 3ML LUER SLIP (SYRINGE) IMPLANT
NDL HYPO 25X5/8 SAFETYGLIDE (NEEDLE) IMPLANT
NEEDLE HYPO 25X5/8 SAFETYGLIDE (NEEDLE) ×2 IMPLANT
NS IRRIG 1000ML POUR BTL (IV SOLUTION) ×2 IMPLANT
PACK C SECTION WH (CUSTOM PROCEDURE TRAY) ×2 IMPLANT
PAD OB MATERNITY 4.3X12.25 (PERSONAL CARE ITEMS) ×2 IMPLANT
RTRCTR C-SECT PINK 25CM LRG (MISCELLANEOUS) ×2 IMPLANT
STRIP CLOSURE SKIN 1/2X4 (GAUZE/BANDAGES/DRESSINGS) ×1 IMPLANT
SUT CHROMIC 1 CTX 36 (SUTURE) ×4 IMPLANT
SUT PLAIN 0 NONE (SUTURE) ×1 IMPLANT
SUT PLAIN 2 0 XLH (SUTURE) ×2 IMPLANT
SUT VIC AB 0 CT1 27 (SUTURE) ×4
SUT VIC AB 0 CT1 27XBRD ANBCTR (SUTURE) ×2 IMPLANT
SUT VIC AB 2-0 CT1 27 (SUTURE) ×4
SUT VIC AB 2-0 CT1 TAPERPNT 27 (SUTURE) ×1 IMPLANT
SUT VIC AB 3-0 CT1 27 (SUTURE) ×4
SUT VIC AB 3-0 CT1 TAPERPNT 27 (SUTURE) ×1 IMPLANT
SUT VIC AB 4-0 KS 27 (SUTURE) ×2 IMPLANT
TOWEL OR 17X24 6PK STRL BLUE (TOWEL DISPOSABLE) ×3 IMPLANT
TRAY FOLEY CATH 14FR (SET/KITS/TRAYS/PACK) ×2 IMPLANT

## 2014-02-09 NOTE — Progress Notes (Signed)
Patient ID: Paula Cervantes, female   DOB: [birth year], 39 y.o.   MRN: [id number] Per pt no changes in dictated H&P. Brief exam WNL.

## 2014-02-09 NOTE — Addendum Note (Signed)
Addendum  created 02/09/14 1405 by Ignacia Bayley, CRNA   Modules edited: Notes Section   Notes Section:  File: [id number]

## 2014-02-09 NOTE — Anesthesia Postprocedure Evaluation (Signed)
  Anesthesia Post Note  Patient: Paula Cervantes  Procedure(s) Performed: Procedure(s) (LRB): CESAREAN SECTION WITH BILATERAL TUBAL LIGATION (Bilateral)  Anesthesia type: Spinal  Patient location: PACU  Post pain: Pain level controlled  Post assessment: Post-op Vital signs reviewed  Last Vitals:  Filed Vitals:   02/09/14 0930  BP: 127/65  Pulse: 92  Temp:   Resp: 13    Post vital signs: Reviewed  Level of consciousness: awake  Complications: No apparent anesthesia complications

## 2014-02-09 NOTE — Anesthesia Postprocedure Evaluation (Signed)
  Anesthesia Post-op Note  Patient: Paula Cervantes  Procedure(s) Performed: Procedure(s): CESAREAN SECTION WITH BILATERAL TUBAL LIGATION (Bilateral)  Patient Location: Mother/Baby  Anesthesia Type:Spinal  Level of Consciousness: awake  Airway and Oxygen Therapy: Patient Spontanous Breathing  Post-op Pain: mild  Post-op Assessment: Patient's Cardiovascular Status Stable and Respiratory Function Stable  Post-op Vital Signs: stable  Last Vitals:  Filed Vitals:   02/09/14 1121  BP: 110/60  Pulse: 79  Temp: 36.5 C  Resp: 18    Complications: No apparent anesthesia complications

## 2014-02-09 NOTE — Transfer of Care (Signed)
Immediate Anesthesia Transfer of Care Note  Patient: Paula Cervantes  Procedure(s) Performed: Procedure(s): CESAREAN SECTION WITH BILATERAL TUBAL LIGATION (Bilateral)  Patient Location: PACU  Anesthesia Type:Spinal  Level of Consciousness: awake, alert , oriented and patient cooperative  Airway & Oxygen Therapy: Patient Spontanous Breathing  Post-op Assessment: Report given to PACU RN and Post -op Vital signs reviewed and stable  Post vital signs: Reviewed and stable  Complications: No apparent anesthesia complications

## 2014-02-09 NOTE — Op Note (Addendum)
Operative Note  Preoperative Diagnosis Prior c-section, declines TOL Desires permanent sterillity  Postoperative Diagnosis Same Scar revision  Procedure  Low transverse repeat c-section and 2 layer closure of uterus  Surgeon Dr. Paula Compton Dr. Newton Pigg  Anesthesia Spinal  Fluids Estimated blood loss 700 cc Urine output 150 cc clear urine IV fluid 2200 cc LR  Findings There was a viable female infant in the vertex presentation Apgars were 8 and 9 weight was 9 pounds even. Normal female anatomy was noted.  Specimen Right and left distal fallopian tube segment sent to pathology Placenta sent to L&D  Procedure note  Patient was taken to the operating room where spinal anesthesia was obtained and was found to be adequate by Allis clamp test. She was prepped and draped in the normal sterile fashion in the dorsal supine position with a leftward tilt. An appropriate time out was performed. A Pfannenstiel skin incision was then made with the scalpel through a pre-existing scar and carried through to the underlying layer of fascia by sharp dissection and Bovie cautery. The fascia was nicked in the midline and the incision was extended laterally with Mayo scissors. The inferior aspect of the incision was grasped Coker clamps and dissected off the underlying rectus muscles. In a similar fashion the superior aspect was dissected off the rectus muscles. Rectus muscles were separated in the midline and the peritoneal cavity entered bluntly. The peritoneal incision was then extended both superiorly and inferiorly with careful attention to avoid both bowel and bladder. The Alexis self-retaining wound retractor was then placed within the incision and the lower uterine segment exposed. The bladder flap was developed with Metzenbaum scissors and pushed away from the lower uterine segment. The lower uterine segment was then incised in a transverse fashion and the cavity itself entered bluntly.  The incision was extended bluntly. The infant's head was then lifted and delivered from the incision without difficulty. The remainder of the infant delivered and the nose and mouth bulb suctioned with the cord clamped and cut as well. The infant was handed off to the waiting pediatricians. The placenta was then spontaneously expressed from the uterus and the uterus cleared of all clots and debris with moist lap sponge. The uterine incision was then repaired in 2 layers the first layer was a running locked layer 1-0 chromic and the second an imbricating layer of the same suture. The tubes and ovaries were inspected and the gutters cleared of all clots and debris. the tubes were traced out to their fimbriated end bilaterally and the distal end of the fallopian tube freed from the mesosalpinx. This was then clamped bilaterally with a Haney clamp and suture-ligated after the distal end was transected. Each pedicle was suture ligated 2 with 3-0 Vicryl. Both pedicles appeared hemostatic. The uterine incision was inspected and found to be hemostatic. All instruments and sponges as well as the Alexis retractor were then removed from the abdomen. The rectus muscles and peritoneum were then reapproximated with several interrupted mattress sutures of 2-0 Vicryl. The fascia was then closed with 0 Vicryl in a running fashion.and area of keloid along the old scar was removed sharply with scalpel.  Subcutaneous tissue was reapproximated with 3-0 plain in a running fashion. The skin was closed with a subcuticular stitch of 4-0 Vicryl on a Keith needle and then reinforced with benzoin and Steri-Strips. At the conclusion of the procedure all instruments and sponge counts were correct. Patient was taken to the recovery room in good  condition with her baby accompanying her skin to skin.

## 2014-02-09 NOTE — Anesthesia Procedure Notes (Signed)
Spinal Patient location during procedure: OR Start time: 02/09/2014 7:27 AM Staffing Anesthesiologist: Rudean Curt Performed by: anesthesiologist  Preanesthetic Checklist Completed: patient identified, site marked, surgical consent, pre-op evaluation, timeout performed, IV checked, risks and benefits discussed and monitors and equipment checked Spinal Block Patient position: sitting Prep: DuraPrep Patient monitoring: heart rate, cardiac monitor, continuous pulse ox and blood pressure Approach: midline Location: L3-4 Injection technique: single-shot Needle Needle type: Sprotte  Needle gauge: 24 G Needle length: 9 cm Assessment Sensory level: T4 Additional Notes Patient identified.  Risk benefits discussed including failed block, incomplete pain control, headache, nerve damage, paralysis, blood pressure changes, nausea, vomiting, reactions to medication both toxic or allergic, and postpartum back pain.  Patient expressed understanding and wished to proceed.  All questions were answered.  Sterile technique used throughout procedure.  CSF was clear.  No parasthesia or other complications.  Please see nursing notes for vital signs.

## 2014-02-09 NOTE — Anesthesia Preprocedure Evaluation (Signed)
Anesthesia Evaluation  Patient identified by MRN, date of birth, ID band Patient awake    Reviewed: Allergy & Precautions, H&P , NPO status , Patient's Chart, lab work & pertinent test results  Airway Mallampati: II  TM Distance: >3 FB Neck ROM: full    Dental no notable dental hx.    Pulmonary former smoker,    Pulmonary exam normal       Cardiovascular negative cardio ROS      Neuro/Psych negative neurological ROS  negative psych ROS   GI/Hepatic negative GI ROS, Neg liver ROS,   Endo/Other  negative endocrine ROS  Renal/GU negative Renal ROS     Musculoskeletal   Abdominal (+) + obese,   Peds  Hematology negative hematology ROS (+)   Anesthesia Other Findings   Reproductive/Obstetrics (+) Pregnancy                             Anesthesia Physical Anesthesia Plan  ASA: II  Anesthesia Plan: Spinal   Post-op Pain Management:    Induction:   Airway Management Planned:   Additional Equipment:   Intra-op Plan:   Post-operative Plan:   Informed Consent: I have reviewed the patients History and Physical, chart, labs and discussed the procedure including the risks, benefits and alternatives for the proposed anesthesia with the patient or authorized representative who has indicated his/her understanding and acceptance.     Plan Discussed with: CRNA, Anesthesiologist and Surgeon  Anesthesia Plan Comments:         Anesthesia Quick Evaluation

## 2014-02-09 NOTE — Consult Note (Signed)
Neonatology Note:   Attendance at C-section:    I was asked by Dr. Marvel Plan to attend this repeat C/S at term. The mother is a G3P1A1 A pos, GBS positive with fibroids and endometriosis. 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 ROM at delivery, fluid clear. Infant vigorous with good spontaneous cry and tone. Needed only bulb suctioning. Ap 8/9. Lungs clear to ausc in DR. To CN to care of Pediatrician.   Real Cons, MD

## 2014-02-10 ENCOUNTER — Encounter (HOSPITAL_COMMUNITY): Payer: Self-pay | Admitting: Obstetrics and Gynecology

## 2014-02-10 LAB — BIRTH TISSUE RECOVERY COLLECTION (PLACENTA DONATION)

## 2014-02-10 LAB — CBC
HCT: 30.8 % — ABNORMAL LOW (ref 36.0–46.0)
Hemoglobin: 10.3 g/dL — ABNORMAL LOW (ref 12.0–15.0)
MCH: 30.3 pg (ref 26.0–34.0)
MCHC: 33.4 g/dL (ref 30.0–36.0)
MCV: 90.6 fL (ref 78.0–100.0)
PLATELETS: 228 10*3/uL (ref 150–400)
RBC: 3.4 MIL/uL — ABNORMAL LOW (ref 3.87–5.11)
RDW: 13.4 % (ref 11.5–15.5)
WBC: 12.9 10*3/uL — ABNORMAL HIGH (ref 4.0–10.5)

## 2014-02-10 NOTE — Progress Notes (Signed)
Patient called out for lactation again.  Lactation RN was busy upstairs on Women's Unit.  Patient asked for larger flanges for hand pump.  Called house coverage and requested she go downstairs to lactation office to see if she could find larger flanges.  Patient had requested size 32.  House coverage was able to find 30 and 36 so gave both to patient to try.  Explained to patient that Lactation RN has been busy upstairs but that I would request she see patient when she could as per patient's request.

## 2014-02-10 NOTE — Progress Notes (Signed)
Subjective: Postpartum Day 1: Cesarean Delivery Patient reports incisional pain and tolerating PO.  Nl lochia, pain controlled  Objective: Vital signs in last 24 hours: Temp:  [97.7 F (36.5 C)-98.2 F (36.8 C)] 98.2 F (36.8 C) (01/08 0445) Pulse Rate:  [72-100] 85 (01/08 0445) Resp:  [13-19] 18 (01/08 0445) BP: (99-127)/(55-72) 110/60 mmHg (01/08 0445) SpO2:  [96 %-98 %] 96 % (01/08 0445)  Physical Exam:  General: alert and no distress Lochia: appropriate Uterine Fundus: firm Incision: healing well DVT Evaluation: No evidence of DVT seen on physical exam.   Recent Labs  02/07/14 0925 02/10/14 0540  HGB 11.1* 10.3*  HCT 33.0* 30.8*    Assessment/Plan: Status post Cesarean section. Doing well postoperatively.  Continue current care.  Paula Cervantes, Paula Cervantes 02/10/2014, 7:54 AM

## 2014-02-10 NOTE — Progress Notes (Signed)
I received a referral from pt's nurse.  Family reported that all was well and requested a Bible which I brought to them.  [employer] Pager, [phone] 1:57 PM    02/10/14 1300  Clinical Encounter Type  Visited With Patient and family together  Visit Type Initial  Spiritual Encounters  Spiritual Needs [facility] text

## 2014-02-10 NOTE — Lactation Note (Signed)
This note was copied from the chart of [place]. Lactation Consultation Note  Mother has very large nipples.  BM transitioning. Attempted latching baby but could not get depth with latch. Mother pumped 17 ml of colostrum and gave to baby with bottle. Mother plans to pump and supplement w/ formula and will continue to try to latch. She requested size 32 flanges.  Told mother lactation does not carry size 32 flanges. She is going to bring hers from home.  Patient Name: Boy Margarite Vessel [id number] Date: 02/10/2014 Reason for consult: Follow-up assessment   Maternal Data    Feeding    LATCH Score/Interventions                      Lactation Tools Discussed/Used     Consult Status Consult Status: Follow-up Date: 02/11/14 Follow-up type: In-patient    Vivianne Master [facility] 02/10/2014, 2:03 PM

## 2014-02-10 NOTE — Lactation Note (Signed)
This note was copied from the chart of [place]. Lactation Consultation Note Mom states that she didn't breast feed her 87 yr. Old son, but did breast feed her 41 month old for 4 months. Stated that she had some difficulty at first latching but then got it well. This baby is unable to get the large nipples into mouth. Nipples are everted. Encouraged mom to pump and bottle feed until baby's mouth grows into the nipple. Mom has large pendulum shaped breast, w/space noted between breast. Denies PCOS. Has good flow of expressed colostrum.  Patient Name: Boy Aadhira Heffernan Mom shown how to use DEBP & how to disassemble, clean, & reassemble parts. Mom knows to pump q3h for 15-20 min. Reviewed supply and demand. Referred to Baby and Me Book in Breastfeeding section Pg. 22-23 for position options and Proper latch demonstration. Educated about newborn behavior. Mom encouraged to do skin-to-skin. Mom encouraged to feed baby 8-12 times/24 hours and with feeding cues. Hand expression taught to Mom. [place] brochure given w/resources, support groups and [place] services.Encouraged comfort during BF so colostrum flows better and mom will enjoy the feeding longer. Taking deep breaths and breast massage during BF. Mom has supplemented baby w/formulac d/t unable to latch. Demonstrated how much colostrum she pumped. Baby's abd. Distended, has been slightly distended.   Today's Date: 02/10/2014 Reason for consult: Initial assessment   Maternal Data Has patient been taught Hand Expression?: Yes Does the patient have breastfeeding experience prior to this delivery?: Yes  Feeding Feeding Type: Bottle Fed - Breast Milk Nipple Type: Slow - flow Length of feed: 30 min  LATCH Score/Interventions Latch: Grasps breast easily, tongue down, lips flanged, rhythmical sucking. Intervention(s): Adjust position;Breast massage;Assist with latch;Breast compression  Audible Swallowing: A few with stimulation Intervention(s): Skin  to skin;Hand expression;Alternate breast massage  Type of Nipple: Everted at rest and after stimulation  Comfort (Breast/Nipple): Soft / non-tender     Hold (Positioning): Assistance needed to correctly position infant at breast and maintain latch. Intervention(s): Breastfeeding basics reviewed;Support Pillows;Skin to skin;Position options  LATCH Score: 8  Lactation Tools Discussed/Used Tools: Nipple [place] Fuel;Pump Nipple shield size: 24 Breast pump type: Double-Electric Breast Pump Pump Review: Setup, frequency, and cleaning;Milk Storage Initiated by:: Neta Ehlers Date initiated:: 02/10/14   Consult Status Consult Status: Follow-up    Adrena Nakamura G 02/10/2014, 6:18 AM

## 2014-02-11 MED ORDER — OXYCODONE-ACETAMINOPHEN 5-325 MG PO TABS: 1.0000 | ORAL_TABLET | ORAL | Status: DC | PRN

## 2014-02-11 NOTE — Lactation Note (Signed)
This note was copied from the chart of [place]. Lactation Consultation Note  Patient Name: Paula Cervantes [id number] Date: 02/11/2014 Reason for consult: Follow-up assessment  Baby  is 71 hours old , and per mom having issues with sore nipples ( cracked and sore )  LC assessed breast tissue with moms permission and noted the cracking bilaterally at the base And on the left nipple  Tender swelling compared to the right and the tissue tough. Baby recently had a bottle , mom asked to attempt latch on the right , nipple elongated and areola compressed  But baby not interested in eating. LC discussed the importance of consistent pumping at least 8 times a day and prn. Per mom has been using a #36 Flange and on the left it's irritating . LC suggested using EBM , olive oil , or coconut oil. On tissue 1st and then pump. Until the baby can open wider to use a Medela Board based slow flow making sure the upper  Lip if flanged open . LC explained to mom eventually the baby will open mouth wider and latching will improve on the right breast. Due to the swelling on the left and toughness, and large nipple , baby will have to grow into that nipple. LC reviewed sore nipple and engorgement prevention and tx. Per mom active with WIC , and LC suggested a [facility] loaner pump , PW given , and mom to call [place].    Maternal Data Has patient been taught Hand Expression?: Yes  Feeding Feeding Type:  (dad feeding baby a bottle )  LATCH Score/Interventions                Intervention(s): Breastfeeding basics reviewed (see LC note )     Lactation Tools Discussed/Used WIC Program: Yes   Consult Status Consult Status: Follow-up Date: 02/11/14 Follow-up type: In-patient    Myer Haff 02/11/2014, 10:24 AM

## 2014-02-11 NOTE — Discharge Summary (Signed)
[name], [name] NO.:  [id number]  MEDICAL RECORD NO.:  [id number]  LOCATION:  9129                          FACILITY:  [facility]  PHYSICIAN:  Lucille Passy. Ulanda Edison, M.D. DATE OF BIRTH:  08-Dec-1975  DATE OF ADMISSION:  02/09/2014 DATE OF DISCHARGE:  02/11/2014                              DISCHARGE SUMMARY   This is a 39 year old black female, para 1-0-1-1, gravida 3, at [redacted] weeks gestation.  [facility] February 16, 2014, by last period.  Compatible with an 8- week ultrasound, presented for repeat C-section and tubal sterilization. The patient had a prior low-transverse cervical C-section in 2014, for recent herpes simplex virus outbreak.  During the current pregnancy, the patient had a normal panorama and AFP testing, group B strep positive, but allergic to PENICILLIN.  Group B strep was clindamycin sensitive. After careful consideration, the patient wanted her tubes tied.  Blood group and type A positive, negative antibody, rubella immune, RPR nonreactive, hepatitis B surface antigen negative, HIV negative, 1 hour Glucola 141; 3 hour GTT 82, 181, 153, and 68.  Patient underwent the repeat C-section with tubal sterilization removing the distal part of both tubes by Dr. Marvel Plan with Dr. Ulanda Edison assisting under spinal anesthesia.  Blood loss about 700 mL.  Female infant Apgars of 8 and 9, was delivered.  Postpartum, the patient did quite well, resumed normal bowel function, tolerated a regular diet, ambulated well without difficulty, voided well and on the second postoperative day, she was ready for discharge.  Postop hemoglobin 10.3, hematocrit 30.8, white count 12,900, platelet count 228,000.  FINAL DIAGNOSES:  Intrauterine pregnancy at 39 weeks, delivered vertex by C-section.  Tubal sterilization.  Path report showed complete transection with no pathologic abnormalities of both the left and right tubes.  FINAL CONDITION:  Improved.  INSTRUCTIONS:  Include our regular  discharge instruction booklet as well as the after visit summary.  Prescription for Percocet 5/325, 30 tablets, 1 every 4 hours as needed for pain.  Patient is advised not to resume her Tylenol since this might give her too much Tylenol.  She was asked to continue her prenatal vitamins.  Return to the office in 2 weeks to have Dr. Marvel Plan evaluate her incision and schedule circumcision in the time frame Dr. Marvel Plan allows.     Lucille Passy. Ulanda Edison, M.D.     TFH/MEDQ  D:  02/11/2014  T:  02/11/2014  Job:  [id number]

## 2014-02-11 NOTE — Lactation Note (Signed)
This note was copied from the chart of [place]. Lactation Consultation Note  Patient Name: Paula Cervantes [id number] Date: 02/11/2014 Reason for consult: Follow-up assessment;Pump rental [facility] Loaner pump rental complete.  Maternal Data    Feeding Feeding Type: Bottle Fed - Breast Milk  LATCH Score/Interventions                      Lactation Tools Discussed/Used     Consult Status Consult Status: Complete Date: 02/11/14 Follow-up type: In-patient    Katrine Coho 02/11/2014, 3:55 PM

## 2014-02-11 NOTE — Progress Notes (Deleted)
Patient ID: Paula Cervantes, female   DOB: 13-Jun-1975, 39 y.o.   MRN: [id number]  Getting comfortable with epidural  AFVSS gen NAD FHTs 140's, good var, category 1 toco q 2 min SVE 5.6/90/0  IUPC placed w/o diff/comp.  03TW [id number] at 37+ with PROM/labor Expecting SVD

## 2014-02-11 NOTE — Discharge Instructions (Signed)
booklet °

## 2014-02-11 NOTE — Progress Notes (Signed)
Patient ID: Paula Cervantes, female   DOB: 02/28/1975, 39 y.o.   MRN: [id number] #2 afebrile BP normal Tolerating a diet, passing flatus, ambulating and voiding well. For d/c.

## 2014-02-13 ENCOUNTER — Ambulatory Visit (HOSPITAL_COMMUNITY)
Admission: RE | Admit: 2014-02-13 | Discharge: 2014-02-13 | Disposition: A | Discharge status: Home / Self Care | Payer: Medicaid Other | Source: Ambulatory Visit | Attending: Obstetrics and Gynecology | Admitting: Obstetrics and Gynecology

## 2014-02-13 DIAGNOSIS — N6459 Other signs and symptoms in breast: Secondary | ICD-10-CM | POA: Diagnosis not present

## 2014-02-13 DIAGNOSIS — N644 Mastodynia: Secondary | ICD-10-CM | POA: Insufficient documentation

## 2014-02-13 NOTE — Lactation Note (Signed)
Lactation Consult  Mother's reason for visit:  Complains of engorgement and left breast pain Visit Type: walk in Appointment Notes: none Consult:  Initial Lactation Consultant:  Stana Bunting M  ________________________________________________________________________    ________________________________________________________________________  Mother's Name: Otho Bellows Type of delivery:  02-09-14 by C/S Breastfeeding Experience:  Pumped for 4 months with last child, unable to la Maternal Medications:  Took Tylenol yesterday for breast pain  ________________________________________________________________________  Breastfeeding History (Post Discharge)  Frequency of breastfeeding:  Only pumping per patient "due to size of my nipples, baby can not latch"  Pumping  Type of pump:  Symphony Frequency:  q 2 hours for 20-30 minutes Volume:  90 ml per breast  ________________________________________________________________________  Maternal Breast Assessment  Breast:  Compressible, firm clogged ducts noted in areola of left breast and small areas in the breast Nipple:  Erect, large (using 36 flange when pumping and tight on left breast causing friction) Pain level:  7 Pain interventions:  Comfort gels, expressed milk and instructed to lubricate flange with olive oil prior to pumping  Patient arrived complaining of pain in left breast. She has been pumping her breast to express milk for her baby. Breast are lactating and she can easily manually hand express milk. She reports that she pumped about 1 hour ago. 2-3 pea size  clogged ducts were noted in the distal aspect of the breast and firm area behind the areola was noted. Breast were not engorged and mild redness noted on left breast near areola. Observed patient pump both breast using a 36 mm flange. Patient has cracks at the base of the nipple on the right breast and healing cracks on the left nipple. She has pain when the left  nipple extends into the flange when pumping causing friction. Milk expresses easily. Patient has been using Vaseline as a lubricant.   Discussed s/s of engorgement and mastitis. Observed pumping using 36 mm. This is the largest flange so unable to move patient to a larger size. Will investigate to see if a larger flange is made.  Instructed to pump on a lower suction setting to reduce trauma. Comfort gels given with instruction to apply to nipple for comfort and promote healing. Patient instructed to massage breast while pumping only on side. She has been applying warm compresses to clogged ducts. Called Dr. Nena Alexander office and requested the patient get All Purpose Nipple Ointment. Office to call patient. Recommended olive oil and expressed breast milk instead of Vaseline. Patient was talkative and asking questions. She is motivated to provide milk for her baby. Instructed if pain increases, fever, or increase redness, flu like symptoms occur , to notify MD.

## 2014-02-14 ENCOUNTER — Encounter: Payer: Self-pay | Admitting: Obstetrics and Gynecology

## 2014-05-05 ENCOUNTER — Encounter (HOSPITAL_COMMUNITY): Payer: Self-pay | Admitting: *Deleted

## 2014-05-05 ENCOUNTER — Emergency Department (HOSPITAL_COMMUNITY)
Admission: EM | Admit: 2014-05-05 | Discharge: 2014-05-06 | Disposition: A | Discharge status: Home / Self Care | Payer: BC Managed Care – PPO | Attending: Emergency Medicine | Admitting: Emergency Medicine

## 2014-05-05 DIAGNOSIS — Z8739 Personal history of other diseases of the musculoskeletal system and connective tissue: Secondary | ICD-10-CM | POA: Insufficient documentation

## 2014-05-05 DIAGNOSIS — J45909 Unspecified asthma, uncomplicated: Secondary | ICD-10-CM | POA: Insufficient documentation

## 2014-05-05 DIAGNOSIS — R112 Nausea with vomiting, unspecified: Secondary | ICD-10-CM | POA: Insufficient documentation

## 2014-05-05 DIAGNOSIS — R197 Diarrhea, unspecified: Secondary | ICD-10-CM | POA: Insufficient documentation

## 2014-05-05 DIAGNOSIS — Z8742 Personal history of other diseases of the female genital tract: Secondary | ICD-10-CM | POA: Insufficient documentation

## 2014-05-05 DIAGNOSIS — Z8639 Personal history of other endocrine, nutritional and metabolic disease: Secondary | ICD-10-CM | POA: Insufficient documentation

## 2014-05-05 DIAGNOSIS — Z880 Allergy status to penicillin: Secondary | ICD-10-CM | POA: Insufficient documentation

## 2014-05-05 DIAGNOSIS — Z87891 Personal history of nicotine dependence: Secondary | ICD-10-CM | POA: Insufficient documentation

## 2014-05-05 LAB — URINALYSIS, ROUTINE W REFLEX MICROSCOPIC
Bilirubin Urine: NEGATIVE
Glucose, UA: NEGATIVE mg/dL
Hgb urine dipstick: NEGATIVE
Ketones, ur: 15 mg/dL — AB
LEUKOCYTES UA: NEGATIVE
NITRITE: NEGATIVE
Protein, ur: NEGATIVE mg/dL
SPECIFIC GRAVITY, URINE: 1.037 — AB (ref 1.005–1.030)
Urobilinogen, UA: 1 mg/dL (ref 0.0–1.0)
pH: 6.5 (ref 5.0–8.0)

## 2014-05-05 LAB — CBC WITH DIFFERENTIAL/PLATELET
Basophils Absolute: 0 10*3/uL (ref 0.0–0.1)
Basophils Relative: 0 % (ref 0–1)
EOS PCT: 6 % — AB (ref 0–5)
Eosinophils Absolute: 0.3 10*3/uL (ref 0.0–0.7)
HEMATOCRIT: 40.3 % (ref 36.0–46.0)
Hemoglobin: 13 g/dL (ref 12.0–15.0)
LYMPHS PCT: 30 % (ref 12–46)
Lymphs Abs: 1.3 10*3/uL (ref 0.7–4.0)
MCH: 28.3 pg (ref 26.0–34.0)
MCHC: 32.3 g/dL (ref 30.0–36.0)
MCV: 87.8 fL (ref 78.0–100.0)
MONOS PCT: 10 % (ref 3–12)
Monocytes Absolute: 0.4 10*3/uL (ref 0.1–1.0)
NEUTROS ABS: 2.3 10*3/uL (ref 1.7–7.7)
Neutrophils Relative %: 54 % (ref 43–77)
Platelets: 368 10*3/uL (ref 150–400)
RBC: 4.59 MIL/uL (ref 3.87–5.11)
RDW: 13.5 % (ref 11.5–15.5)
WBC: 4.3 10*3/uL (ref 4.0–10.5)

## 2014-05-05 LAB — BASIC METABOLIC PANEL
ANION GAP: 8 (ref 5–15)
BUN: 9 mg/dL (ref 6–23)
CHLORIDE: 105 mmol/L (ref 96–112)
CO2: 25 mmol/L (ref 19–32)
CREATININE: 0.73 mg/dL (ref 0.50–1.10)
Calcium: 8.7 mg/dL (ref 8.4–10.5)
GFR calc non Af Amer: >90 mL/min (ref >90)
Glucose, Bld: 114 mg/dL — ABNORMAL HIGH (ref 70–99)
POTASSIUM: 3.6 mmol/L (ref 3.5–5.1)
Sodium: 138 mmol/L (ref 135–145)

## 2014-05-05 MED ORDER — LOPERAMIDE HCL 2 MG PO CAPS
4.0000 mg | ORAL_CAPSULE | Freq: Once | ORAL | Status: AC
  Administered 2014-05-05: 4 mg via ORAL
  Filled 2014-05-05: qty 2

## 2014-05-05 MED ORDER — SODIUM CHLORIDE 0.9 % IV SOLN
1000.0000 mL | Freq: Once | INTRAVENOUS | Status: AC
  Administered 2014-05-05: 1000 mL via INTRAVENOUS

## 2014-05-05 MED ORDER — ONDANSETRON HCL 4 MG/2ML IJ SOLN
4.0000 mg | Freq: Once | INTRAMUSCULAR | Status: AC
  Administered 2014-05-05: 4 mg via INTRAVENOUS
  Filled 2014-05-05: qty 2

## 2014-05-05 MED ORDER — SODIUM CHLORIDE 0.9 % IV SOLN: 1000.0000 mL | INTRAVENOUS | Status: DC

## 2014-05-05 MED ORDER — PROMETHAZINE HCL 25 MG RE SUPP: 25.0000 mg | Freq: Four times a day (QID) | RECTAL | Status: DC | PRN

## 2014-05-05 MED ORDER — METOCLOPRAMIDE HCL 10 MG PO TABS: 10.0000 mg | ORAL_TABLET | Freq: Four times a day (QID) | ORAL | Status: DC | PRN

## 2014-05-05 MED ORDER — ONDANSETRON 4 MG PO TBDP
8.0000 mg | ORAL_TABLET | Freq: Once | ORAL | Status: AC
  Administered 2014-05-05: 8 mg via ORAL
  Filled 2014-05-05: qty 2

## 2014-05-05 NOTE — Discharge Instructions (Signed)
Take loperamide (Imodium AD) as needed for diarrhea. ° °Nausea and Vomiting °Nausea is a sick feeling that often comes before throwing up (vomiting). Vomiting is a reflex where stomach contents come out of your mouth. Vomiting can cause severe loss of body fluids (dehydration). Children and elderly adults can become dehydrated quickly, especially if they also have diarrhea. Nausea and vomiting are symptoms of a condition or disease. It is important to find the cause of your symptoms. °CAUSES  °· Direct irritation of the stomach lining. This irritation can result from increased acid production (gastroesophageal reflux disease), infection, food poisoning, taking certain medicines (such as nonsteroidal anti-inflammatory drugs), alcohol use, or tobacco use. °· Signals from the brain. These signals could be caused by a headache, heat exposure, an inner ear disturbance, increased pressure in the brain from injury, infection, a tumor, or a concussion, pain, emotional stimulus, or metabolic problems. °· An obstruction in the gastrointestinal tract (bowel obstruction). °· Illnesses such as diabetes, hepatitis, gallbladder problems, appendicitis, kidney problems, cancer, sepsis, atypical symptoms of a heart attack, or eating disorders. °· Medical treatments such as chemotherapy and radiation. °· Receiving medicine that makes you sleep (general anesthetic) during surgery. °DIAGNOSIS °Your caregiver may ask for tests to be done if the problems do not improve after a few days. Tests may also be done if symptoms are severe or if the reason for the nausea and vomiting is not clear. Tests may include: °· Urine tests. °· Blood tests. °· Stool tests. °· Cultures (to look for evidence of infection). °· X-rays or other imaging studies. °Test results can help your caregiver make decisions about treatment or the need for additional tests. °TREATMENT °You need to stay well hydrated. Drink frequently but in small amounts. You may wish to  drink water, sports drinks, clear broth, or eat frozen ice pops or gelatin dessert to help stay hydrated. When you eat, eating slowly may help prevent nausea. There are also some antinausea medicines that may help prevent nausea. °HOME CARE INSTRUCTIONS  °· Take all medicine as directed by your caregiver. °· If you do not have an appetite, do not force yourself to eat. However, you must continue to drink fluids. °· If you have an appetite, eat a normal diet unless your caregiver tells you differently. °· Eat a variety of complex carbohydrates (rice, wheat, potatoes, bread), lean meats, yogurt, fruits, and vegetables. °· Avoid high-fat foods because they are more difficult to digest. °· Drink enough water and fluids to keep your urine clear or pale yellow. °· If you are dehydrated, ask your caregiver for specific rehydration instructions. Signs of dehydration may include: °· Severe thirst. °· Dry lips and mouth. °· Dizziness. °· Dark urine. °· Decreasing urine frequency and amount. °· Confusion. °· Rapid breathing or pulse. °SEEK IMMEDIATE MEDICAL CARE IF:  °· You have blood or brown flecks (like coffee grounds) in your vomit. °· You have black or bloody stools. °· You have a severe headache or stiff neck. °· You are confused. °· You have severe abdominal pain. °· You have chest pain or trouble breathing. °· You do not urinate at least once every 8 hours. °· You develop cold or clammy skin. °· You continue to vomit for longer than 24 to 48 hours. °· You have a fever. °MAKE SURE YOU:  °· Understand these instructions. °· Will watch your condition. °· Will get help right away if you are not doing well or get worse. °Document Released: 01/20/2005 Document Revised: 04/14/2011 Document Reviewed: 06/19/2010 °Exit[employer]® Patient   Information ©2015 [employer]. This information is not intended to replace advice given to you by your health care provider. Make sure you discuss any questions you have with your health care  provider. ° °Diarrhea °Diarrhea is frequent loose and watery bowel movements. It can cause you to feel weak and dehydrated. Dehydration can cause you to become tired and thirsty, have a dry mouth, and have decreased urination that often is dark yellow. Diarrhea is a sign of another problem, most often an infection that will not last long. In most cases, diarrhea typically lasts 2-3 days. However, it can last longer if it is a sign of something more serious. It is important to treat your diarrhea as directed by your caregiver to lessen or prevent future episodes of diarrhea. °CAUSES  °Some common causes include: °· Gastrointestinal infections caused by viruses, bacteria, or parasites. °· Food poisoning or food allergies. °· Certain medicines, such as antibiotics, chemotherapy, and laxatives. °· Artificial sweeteners and fructose. °· Digestive disorders. °HOME CARE INSTRUCTIONS °· Ensure adequate fluid intake (hydration): Have 1 cup (8 oz) of fluid for each diarrhea episode. Avoid fluids that contain simple sugars or sports drinks, fruit juices, whole milk products, and sodas. Your urine should be clear or pale yellow if you are drinking enough fluids. Hydrate with an oral rehydration solution that you can purchase at pharmacies, retail stores, and online. You can prepare an oral rehydration solution at home by mixing the following ingredients together: °¨  - tsp table salt. °¨ ¾ tsp baking soda. °¨  tsp salt substitute containing potassium chloride. °¨ 1  tablespoons sugar. °¨ 1 L (34 oz) of water. °· Certain foods and beverages may increase the speed at which food moves through the gastrointestinal (GI) tract. These foods and beverages should be avoided and include: °¨ Caffeinated and alcoholic beverages. °¨ High-fiber foods, such as raw fruits and vegetables, nuts, seeds, and whole grain breads and cereals. °¨ Foods and beverages sweetened with sugar alcohols, such as xylitol, sorbitol, and mannitol. °· Some foods  may be well tolerated and may help thicken stool including: °¨ Starchy foods, such as rice, toast, pasta, low-sugar cereal, oatmeal, grits, baked potatoes, crackers, and bagels. °¨ Bananas. °¨ Applesauce. °· Add probiotic-rich foods to help increase healthy bacteria in the GI tract, such as yogurt and fermented milk products. °· Wash your hands well after each diarrhea episode. °· Only take over-the-counter or prescription medicines as directed by your caregiver. °· Take a warm bath to relieve any burning or pain from frequent diarrhea episodes. °SEEK IMMEDIATE MEDICAL CARE IF:  °· You are unable to keep fluids down. °· You have persistent vomiting. °· You have blood in your stool, or your stools are black and tarry. °· You do not urinate in 6-8 hours, or there is only a small amount of very dark urine. °· You have abdominal pain that increases or localizes. °· You have weakness, dizziness, confusion, or light-headedness. °· You have a severe headache. °· Your diarrhea gets worse or does not get better. °· You have a fever or persistent symptoms for more than 2-3 days. °· You have a fever and your symptoms suddenly get worse. °MAKE SURE YOU:  °· Understand these instructions. °· Will watch your condition. °· Will get help right away if you are not doing well or get worse. °Document Released: 01/10/2002 Document Revised: 06/06/2013 Document Reviewed: 09/28/2011 °ExitCare® Patient Information ©2015 [employer]. This information is not intended to replace advice given to you   by your health care provider. Make sure you discuss any questions you have with your health care provider. ° °Metoclopramide tablets °What is this medicine? °METOCLOPRAMIDE (met oh kloe PRA mide) is used to treat the symptoms of gastroesophageal reflux disease (GERD) like heartburn. It is also used to treat people with slow emptying of the stomach and intestinal tract. °This medicine may be used for other purposes; ask your health care provider  or pharmacist if you have questions. °COMMON BRAND NAME(S): Reglan °What should I tell my health care provider before I take this medicine? °They need to know if you have any of these conditions: °-breast cancer °-depression °-diabetes °-heart failure °-high blood pressure °-kidney disease °-liver disease °-Parkinson's disease or a movement disorder °-pheochromocytoma °-seizures °-stomach obstruction, bleeding, or perforation °-an unusual or allergic reaction to metoclopramide, procainamide, sulfites, other medicines, foods, dyes, or preservatives °-pregnant or trying to get pregnant °-breast-feeding °How should I use this medicine? °Take this medicine by mouth with a glass of water. Follow the directions on the prescription label. Take this medicine on an empty stomach, about 30 minutes before eating. Take your doses at regular intervals. Do not take your medicine more often than directed. Do not stop taking except on the advice of your doctor or health care professional. °A special MedGuide will be given to you by the pharmacist with each prescription and refill. Be sure to read this information carefully each time. °Talk to your pediatrician regarding the use of this medicine in children. Special care may be needed. °Overdosage: If you think you have taken too much of this medicine contact a poison control center or emergency room at once. °NOTE: This medicine is only for you. Do not share this medicine with others. °What if I miss a dose? °If you miss a dose, take it as soon as you can. If it is almost time for your next dose, take only that dose. Do not take double or extra doses. °What may interact with this medicine? °-acetaminophen °-cyclosporine °-digoxin °-medicines for blood pressure °-medicines for diabetes, including insulin °-medicines for hay fever and other allergies °-medicines for depression, especially an Monoamine Oxidase Inhibitor (MAOI) °-medicines for Parkinson's disease, like  levodopa °-medicines for sleep or for pain °-tetracycline °This list may not describe all possible interactions. Give your health care provider a list of all the medicines, herbs, non-prescription drugs, or dietary supplements you use. Also tell them if you smoke, drink alcohol, or use illegal drugs. Some items may interact with your medicine. °What should I watch for while using this medicine? °It may take a few weeks for your stomach condition to start to get better. However, do not take this medicine for longer than 12 weeks. The longer you take this medicine, and the more you take it, the greater your chances are of developing serious side effects. If you are an elderly patient, a female patient, or you have diabetes, you may be at an increased risk for side effects from this medicine. Contact your doctor immediately if you start having movements you cannot control such as lip smacking, rapid movements of the tongue, involuntary or uncontrollable movements of the eyes, head, arms and legs, or muscle twitches and spasms. °Patients and their families should watch out for worsening depression or thoughts of suicide. Also watch out for any sudden or severe changes in feelings such as feeling anxious, agitated, panicky, irritable, hostile, aggressive, impulsive, severely restless, overly excited and hyperactive, or not being able to sleep. If this   happens, especially at the beginning of treatment or after a change in dose, call your doctor. Do not treat yourself for high fever. Ask your doctor or health care professional for advice. You may get drowsy or dizzy. Do not drive, use machinery, or do anything that needs mental alertness until you know how this drug affects you. Do not stand or sit up quickly, especially if you are an older patient. This reduces the risk of dizzy or fainting spells. Alcohol can make you more drowsy and dizzy. Avoid alcoholic drinks. What side effects may I notice from receiving this  medicine? Side effects that you should report to your doctor or health care professional as soon as possible: -allergic reactions like skin rash, itching or hives, swelling of the face, lips, or tongue -abnormal production of milk in females -breast enlargement in both males and females -change in the way you walk -difficulty moving, speaking or swallowing -drooling, lip smacking, or rapid movements of the tongue -excessive sweating -fever -involuntary or uncontrollable movements of the eyes, head, arms and legs -irregular heartbeat or palpitations -muscle twitches and spasms -unusually weak or tired Side effects that usually do not require medical attention (report to your doctor or health care professional if they continue or are bothersome): -change in sex drive or performance -depressed mood -diarrhea -difficulty sleeping -headache -menstrual changes -restless or nervous This list may not describe all possible side effects. Call your doctor for medical advice about side effects. You may report side effects to FDA at 1-800-FDA-1088. Where should I keep my medicine? Keep out of the reach of children. Store at room temperature between 20 and 25 degrees C (68 and 77 degrees F). Protect from light. Keep container tightly closed. Throw away any unused medicine after the expiration date. NOTE: This sheet is a summary. It may not cover all possible information. If you have questions about this medicine, talk to your doctor, pharmacist, or health care provider.  2015, [employer]. (2011-05-20 13:04:38)  Promethazine suppositories What is this medicine? PROMETHAZINE (proe METH a zeen) is an antihistamine. It is used to treat allergic reactions and to treat or prevent nausea and vomiting from illness or motion sickness. It is also used to make you sleep before surgery, and to help treat pain or nausea after surgery. This medicine may be used for other purposes; ask your health  care provider or pharmacist if you have questions. COMMON BRAND NAME(S): Phenadoz, Phenergan, Promethegan What should I tell my health care provider before I take this medicine? They need to know if you have any of these conditions: -glaucoma -high blood pressure or heart disease -kidney disease -liver disease -lung or breathing disease, like asthma -prostate trouble -pain or difficulty passing urine -seizures -an unusual or allergic reaction to promethazine or phenothiazines, other medicines, foods, dyes, or preservatives -pregnant or trying to get pregnant -breast-feeding How should I use this medicine? This medicine is for rectal use only. Do not take by mouth. Wash your hands before and after use. Take off the foil wrapping. Wet the tip of the suppository with cold tap water to make it easier to use. Lie on your side with your lower leg straightened out and your upper leg bent forward toward your stomach. Lift upper buttock to expose the rectal area. Apply gentle pressure to insert the suppository completely into the rectum, pointed end first. Hold buttocks together for a few seconds. Remain lying down for about 15 minutes to avoid having the suppository come out. Do  not use more often than directed. Talk to your pediatrician regarding the use of this medicine in children. Special care may be needed. This medicine should not be given to infants and children younger than 15 years old. Overdosage: If you think you have taken too much of this medicine contact a poison control center or emergency room at once. NOTE: This medicine is only for you. Do not share this medicine with others. What if I miss a dose? If you miss a dose, use it as soon as you can. If it is almost time for your next dose, use only that dose. Do not use double doses. What may interact with this medicine? Do not take this medicine with any of the following medications: -cisapride -dofetilide -dronedarone -MAOIs like  Carbex, Eldepryl, Marplan, Nardil, Parnate -pimozide -quinidine, including dextromethorphan; quinidine -thioridazine -ziprasidone This medicine may also interact with the following medications: -certain medicines for depression, anxiety, or psychotic disturbances -certain medicines for anxiety or sleep -certain medicines for seizures like carbamazepine, phenobarbital, phenytoin -certain medicines for movement abnormalities as in Parkinson's disease, or for gastrointestinal problems -epinephrine -medicines for allergies or colds -muscle relaxants -narcotic medicines for pain -other medicines that prolong the QT interval (cause an abnormal heart rhythm) -tramadol -trimethobenzamide This list may not describe all possible interactions. Give your health care provider a list of all the medicines, herbs, non-prescription drugs, or dietary supplements you use. Also tell them if you smoke, drink alcohol, or use illegal drugs. Some items may interact with your medicine. What should I watch for while using this medicine? Tell your doctor or health care professional if your symptoms do not start to get better in 1 to 2 days. You may get drowsy or dizzy. Do not drive, use machinery, or do anything that needs mental alertness until you know how this medicine affects you. To reduce the risk of dizzy or fainting spells, do not stand or sit up quickly, especially if you are an older patient. Alcohol may increase dizziness and drowsiness. Avoid alcoholic drinks. Your mouth may get dry. Chewing sugarless gum or sucking hard candy, and drinking plenty of water may help. Contact your doctor if the problem does not go away or is severe. This medicine may cause dry eyes and blurred vision. If you wear contact lenses you may feel some discomfort. Lubricating drops may help. See your eye doctor if the problem does not go away or is severe. This medicine can make you more sensitive to the sun. Keep out of the sun. If  you cannot avoid being in the sun, wear protective clothing and use sunscreen. Do not use sun lamps or tanning beds/booths. If you are diabetic, check your blood-sugar levels regularly. What side effects may I notice from receiving this medicine? Side effects that you should report to your doctor or health care professional as soon as possible: -blurred vision -irregular heartbeat, palpitations or chest pain -muscle or facial twitches -pain or difficulty passing urine -seizures -skin rash -slowed or shallow breathing -unusual bleeding or bruising -yellowing of the eyes or skin Side effects that usually do not require medical attention (report to your doctor or health care professional if they continue or are bothersome): -headache -nightmares, agitation, nervousness, excitability, not able to sleep (these are more likely in children) -stuffy nose This list may not describe all possible side effects. Call your doctor for medical advice about side effects. You may report side effects to FDA at 1-800-FDA-1088. Where should I keep my medicine? Keep  out of the reach of children. Store in a refrigerator between 2 and 8 degrees C (36 and 46 degrees F). Throw away any unused medicine after the expiration date. NOTE: This sheet is a summary. It may not cover all possible information. If you have questions about this medicine, talk to your doctor, pharmacist, or health care provider.  2015, [employer]. (2012-09-22 15:57:29)

## 2014-05-05 NOTE — ED Notes (Signed)
The pt has had nv and diarhhea for 2 days.  Her child has been diagnosed with the nora virus

## 2014-05-05 NOTE — ED Provider Notes (Signed)
CSN: [id number]     Arrival date & time 05/05/14  1620 History   First MD Initiated Contact with Patient 05/05/14 2031     Chief Complaint  Patient presents with  . Emesis     (Consider location/radiation/quality/duration/timing/severity/associated sxs/prior Treatment) Patient is a 39 y.o. female presenting with vomiting. The history is provided by the patient.  Emesis She had onset last night of nausea, vomiting, diarrhea associated with some abdominal cramping and generalized body aches. She rated pain at 6/10. She denies fever or sweats but has had some chills. She has had sick contacts with similar illness and thinks that this may be the norovirus. There's been no blood in stool or emesis but she has noted some mucus in the stool. She is taken over-the-counter diarrhea medicine and something for nausea which she cannot recall what was but they have not helped. She's also taken to chews without relief.  Past Medical History  Diagnosis Date  . Abnormal Pap smear     no cervical cancer, just h/o cryotherapy  . Fibroids   . Endometriosis   . Gout   . Asthma     no episodes/no inhaler needed  . Heartburn during pregnancy   . Lactose intolerance    Past Surgical History  Procedure Laterality Date  . Ovarian fibroids    . Knee surgery    . Cryotherapy    . Diagnostic laparoscopy    . Cesarean section N/A 09/20/2012    Procedure: PRIMARY CESAREAN SECTION;  Surgeon: Margarette Asal, MD;  Location: [facility];  Service: Obstetrics;  Laterality: N/A;  Primary edc 8/25  . Cesarean section with bilateral tubal ligation Bilateral 02/09/2014    Procedure: CESAREAN SECTION WITH BILATERAL TUBAL LIGATION;  Surgeon: Logan Bores, MD;  Location: [facility];  Service: Obstetrics;  Laterality: Bilateral;   Family History  Problem Relation Age of Onset  . Alcohol abuse Neg Hx   . Arthritis Neg Hx   . Asthma Neg Hx   . Birth defects Neg Hx   . Cancer Neg Hx   . COPD Neg Hx   . Depression Neg Hx    . Diabetes Neg Hx   . Drug abuse Neg Hx   . Early death Neg Hx   . Hearing loss Neg Hx   . Heart disease Neg Hx   . Hyperlipidemia Neg Hx   . Hypertension Neg Hx   . Kidney disease Neg Hx   . Learning disabilities Neg Hx   . Mental illness Neg Hx   . Mental retardation Neg Hx   . Miscarriages / Stillbirths Neg Hx   . Stroke Neg Hx   . Vision loss Neg Hx   . Varicose Veins Neg Hx    History  Substance Use Topics  . Smoking status: Former Smoker    Quit date: 03/27/2002  . Smokeless tobacco: Never Used  . Alcohol Use: No   OB History    Gravida Para Term Preterm AB TAB SAB Ectopic Multiple Living   3 2 2  1  1   0 2     Review of Systems  Gastrointestinal: Positive for vomiting.  All other systems reviewed and are negative.     Allergies  Penicillins; Sulfa antibiotics; Aspirin; and Coconut flavor  Home Medications   Prior to Admission medications   Medication Sig Start Date End Date Taking? Authorizing Provider  oxyCODONE-acetaminophen (PERCOCET/ROXICET) 5-325 MG per tablet Take 1 tablet by mouth every 4 (four) hours as  needed (for pain scale less than 7). Patient not taking: Reported on 05/05/2014 02/11/14   Newton Pigg, MD   BP 125/72 mmHg  Pulse 114  Temp(Src) 98.4 F (36.9 C)  Resp 20  SpO2 96% Physical Exam  Nursing note and vitals reviewed.  39 year old female, resting comfortably and in no acute distress. Vital signs are significant for tachycardia. Oxygen saturation is 96%, which is normal. Head is normocephalic and atraumatic. PERRLA, EOMI. Oropharynx is clear. Neck is nontender and supple without adenopathy or JVD. Back is nontender and there is no CVA tenderness. Lungs are clear without rales, wheezes, or rhonchi. Chest is nontender. Heart has regular rate and rhythm without murmur. Abdomen is soft, flat, nontender without masses or hepatosplenomegaly and peristalsis is hypoactive. Extremities have no cyanosis or edema, full range of motion is  present. Skin is warm and dry without rash. Neurologic: Mental status is normal, cranial nerves are intact, there are no motor or sensory deficits.  ED Course  Procedures (including critical care time) Labs Review Results for orders placed or performed during the hospital encounter of 05/05/14  Urinalysis, Routine w reflex microscopic  Result Value Ref Range   Color, Urine YELLOW YELLOW   APPearance HAZY (A) CLEAR   Specific Gravity, Urine 1.037 (H) 1.005 - 1.030   pH 6.5 5.0 - 8.0   Glucose, UA NEGATIVE NEGATIVE mg/dL   Hgb urine dipstick NEGATIVE NEGATIVE   Bilirubin Urine NEGATIVE NEGATIVE   Ketones, ur 15 (A) NEGATIVE mg/dL   Protein, ur NEGATIVE NEGATIVE mg/dL   Urobilinogen, UA 1.0 0.0 - 1.0 mg/dL   Nitrite NEGATIVE NEGATIVE   Leukocytes, UA NEGATIVE NEGATIVE  CBC with Differential  Result Value Ref Range   WBC 4.3 4.0 - 10.5 K/uL   RBC 4.59 3.87 - 5.11 MIL/uL   Hemoglobin 13.0 12.0 - 15.0 g/dL   HCT 40.3 36.0 - 46.0 %   MCV 87.8 78.0 - 100.0 fL   MCH 28.3 26.0 - 34.0 pg   MCHC 32.3 30.0 - 36.0 g/dL   RDW 13.5 11.5 - 15.5 %   Platelets 368 150 - 400 K/uL   Neutrophils Relative % 54 43 - 77 %   Neutro Abs 2.3 1.7 - 7.7 K/uL   Lymphocytes Relative 30 12 - 46 %   Lymphs Abs 1.3 0.7 - 4.0 K/uL   Monocytes Relative 10 3 - 12 %   Monocytes Absolute 0.4 0.1 - 1.0 K/uL   Eosinophils Relative 6 (H) 0 - 5 %   Eosinophils Absolute 0.3 0.0 - 0.7 K/uL   Basophils Relative 0 0 - 1 %   Basophils Absolute 0.0 0.0 - 0.1 K/uL  Basic metabolic panel  Result Value Ref Range   Sodium 138 135 - 145 mmol/L   Potassium 3.6 3.5 - 5.1 mmol/L   Chloride 105 96 - 112 mmol/L   CO2 25 19 - 32 mmol/L   Glucose, Bld 114 (H) 70 - 99 mg/dL   BUN 9 6 - 23 mg/dL   Creatinine, Ser 0.73 0.50 - 1.10 mg/dL   Calcium 8.7 8.4 - 10.5 mg/dL   GFR calc non Af Amer >90 >90 mL/min   GFR calc Af Amer >90 >90 mL/min   Anion gap 8 5 - 15     MDM   Final diagnoses:  Nausea vomiting and diarrhea     Nausea, vomiting, diarrhea in pattern consistent with viral gastroenteritis. Urinalysis shows high specific gravity consistent with dehydration. She will be  given IV hydration, ondansetron for nausea, and low might for diarrhea.  Following above-noted treatment, patient stated that she still felt a little queasy but she was able to tolerate oral fluids and even ate some crackers without vomiting. She is discharged with prescriptions for metoclopramide tablet 10 promethazine suppositories and is advised using over-the-counter loperamide as needed for diarrhea. Return if above-noted medications are not controlling symptoms as an outpatient.    Delora Fuel, MD 70/26/37 [id number]

## 2014-05-05 NOTE — ED Notes (Signed)
tubwes ties 2 months ago when she delivered

## 2014-08-23 ENCOUNTER — Ambulatory Visit (INDEPENDENT_AMBULATORY_CARE_PROVIDER_SITE_OTHER): Payer: BLUE CROSS/BLUE SHIELD | Admitting: Emergency Medicine

## 2014-08-23 VITALS — BP 118/62 | HR 94 | Temp 98.5°F | Resp 14 | Ht 67.0 in | Wt 200.8 lb

## 2014-08-23 DIAGNOSIS — R300 Dysuria: Secondary | ICD-10-CM

## 2014-08-23 DIAGNOSIS — N3000 Acute cystitis without hematuria: Secondary | ICD-10-CM

## 2014-08-23 DIAGNOSIS — G43909 Migraine, unspecified, not intractable, without status migrainosus: Secondary | ICD-10-CM | POA: Insufficient documentation

## 2014-08-23 LAB — POCT UA - MICROSCOPIC ONLY
Casts, Ur, LPF, POC: NEGATIVE
Crystals, Ur, HPF, POC: NEGATIVE
Mucus, UA: NEGATIVE
Yeast, UA: NEGATIVE

## 2014-08-23 LAB — POCT URINALYSIS DIPSTICK
BILIRUBIN UA: NEGATIVE
Glucose, UA: NEGATIVE
KETONES UA: NEGATIVE
NITRITE UA: POSITIVE
PROTEIN UA: 100
SPEC GRAV UA: 1.02
Urobilinogen, UA: 0.2
pH, UA: 7

## 2014-08-23 MED ORDER — CIPROFLOXACIN HCL 500 MG PO TABS: 500.0000 mg | ORAL_TABLET | Freq: Two times a day (BID) | ORAL | Status: DC

## 2014-08-23 MED ORDER — PHENAZOPYRIDINE HCL 200 MG PO TABS: 200.0000 mg | ORAL_TABLET | Freq: Three times a day (TID) | ORAL | Status: DC | PRN

## 2014-08-23 NOTE — Progress Notes (Signed)
Subjective:  Patient ID: Paula Cervantes, female    DOB: September 19, 1975  Age: 39 y.o. MRN: [id number]  CC: Urinary Frequency; Dysuria; and Dizziness   HPI [name] presents  patient has a 3 day history of dysuria urgency and frequency. She has no fever or chills. She has no back pain nausea or vomiting. Has no vaginal discharge or bleeding. Has no dyspareunia. She's had no improvement with over-the-counter agents for dysuria. Has a history of prior urinary tract infections.  History [name] has a past medical history of Abnormal Pap smear; Fibroids; Endometriosis; Gout; Asthma; Heartburn during pregnancy; Lactose intolerance; and Arthritis.   She has past surgical history that includes ovarian fibroids; Knee surgery; Cryotherapy; Diagnostic laparoscopy; Cesarean section (N/A, 09/20/2012); and Cesarean section with bilateral tubal ligation (Bilateral, 02/09/2014).   Her  family history includes Diabetes in her mother; Heart disease in her maternal grandmother; Hyperlipidemia in her mother; Hypertension in her mother. There is no history of Alcohol abuse, Arthritis, Asthma, Birth defects, Cancer, COPD, Depression, Drug abuse, Early death, Hearing loss, Kidney disease, Learning disabilities, Mental illness, Mental retardation, Miscarriages / Stillbirths, Stroke, Vision loss, or Varicose Veins.  She   reports that she quit smoking about 12 years ago. She has never used smokeless tobacco. She reports that she does not drink alcohol or use illicit drugs.  Outpatient Prescriptions Prior to Visit  Medication Sig Dispense Refill  . metoCLOPramide (REGLAN) 10 MG tablet Take 1 tablet (10 mg total) by mouth every 6 (six) hours as needed for nausea. (Patient not taking: Reported on 08/23/2014) 30 tablet 0  . oxyCODONE-acetaminophen (PERCOCET/ROXICET) 5-325 MG per tablet Take 1 tablet by mouth every 4 (four) hours as needed (for pain scale less than 7). (Patient not taking: Reported on 05/05/2014) 30 tablet 0   . promethazine (PHENERGAN) 25 MG suppository Place 1 suppository (25 mg total) rectally every 6 (six) hours as needed for nausea or vomiting. (Patient not taking: Reported on 08/23/2014) 6 suppository 0   No facility-administered medications prior to visit.    History   Social History  . Marital Status: Married    Spouse Name: N/A  . Number of Children: N/A  . Years of Education: N/A   Social History Main Topics  . Smoking status: Former Smoker    Quit date: 03/27/2002  . Smokeless tobacco: Never Used  . Alcohol Use: No  . Drug Use: No  . Sexual Activity: Yes    Birth Control/ Protection: None     Comment: last intercourse Mar24th 2015   Other Topics Concern  . None   Social History Narrative     Review of Systems  Objective:  BP 118/62 mmHg  Pulse 94  Temp(Src) 98.5 F (36.9 C) (Oral)  Resp 14  Ht 5\' 7"  (1.702 m)  Wt 200 lb 12.8 oz (91.082 kg)  BMI 31.44 kg/m2  SpO2 94%  LMP 08/05/2014  Breastfeeding? No  Physical Exam    Assessment & Plan:   Merlina was seen today for urinary frequency, dysuria and dizziness.  Diagnoses and all orders for this visit:  Dysuria Orders: -     POCT urinalysis dipstick -     POCT UA - Microscopic Only  Acute cystitis without hematuria  Other orders -     phenazopyridine (PYRIDIUM) 200 MG tablet; Take 1 tablet (200 mg total) by mouth 3 (three) times daily as needed. -     ciprofloxacin (CIPRO) 500 MG tablet; Take 1 tablet (500 mg total) by mouth  2 (two) times daily.   I am having Ms. Tamayo start on phenazopyridine and ciprofloxacin. I am also having her maintain her oxyCODONE-acetaminophen, metoCLOPramide, and promethazine.  Meds ordered this encounter  Medications  . phenazopyridine (PYRIDIUM) 200 MG tablet    Sig: Take 1 tablet (200 mg total) by mouth 3 (three) times daily as needed.    Dispense:  6 tablet    Refill:  0  . ciprofloxacin (CIPRO) 500 MG tablet    Sig: Take 1 tablet (500 mg total) by mouth 2  (two) times daily.    Dispense:  20 tablet    Refill:  0    Appropriate red flag conditions were discussed with the patient as well as actions that should be taken.  Patient expressed his understanding.  Follow-up: Return if symptoms worsen or fail to improve.  Roselee Culver, MD   Results for orders placed or performed in visit on 08/23/14  POCT urinalysis dipstick  Result Value Ref Range   Color, UA yellow    Clarity, UA slightly cloudy    Glucose, UA neg    Bilirubin, UA neg    Ketones, UA neg    Spec Grav, UA 1.020    Blood, UA trace-intact    pH, UA 7.0    Protein, UA 100    Urobilinogen, UA 0.2    Nitrite, UA positive    Leukocytes, UA moderate (2+) (A) Negative  POCT UA - Microscopic Only  Result Value Ref Range   WBC, Ur, HPF, POC TNTC    RBC, urine, microscopic 1-3    Bacteria, U Microscopic large    Mucus, UA neg    Epithelial cells, urine per micros 4-6    Crystals, Ur, HPF, POC neg    Casts, Ur, LPF, POC neg    Yeast, UA neg

## 2014-08-23 NOTE — Patient Instructions (Signed)
Cystitis Urinary Tract Infection Urinary tract infections (UTIs) can develop anywhere along your urinary tract. Your urinary tract is your body's drainage system for removing wastes and extra water. Your urinary tract includes two kidneys, two ureters, a bladder, and a urethra. Your kidneys are a pair of bean-shaped organs. Each kidney is about the size of your fist. They are located below your ribs, one on each side of your spine. CAUSES Infections are caused by microbes, which are microscopic organisms, including fungi, viruses, and bacteria. These organisms are so small that they can only be seen through a microscope. Bacteria are the microbes that most commonly cause UTIs. SYMPTOMS  Symptoms of UTIs may vary by age and gender of the patient and by the location of the infection. Symptoms in young women typically include a frequent and intense urge to urinate and a painful, burning feeling in the bladder or urethra during urination. Older women and men are more likely to be tired, shaky, and weak and have muscle aches and abdominal pain. A fever may mean the infection is in your kidneys. Other symptoms of a kidney infection include pain in your back or sides below the ribs, nausea, and vomiting. DIAGNOSIS To diagnose a UTI, your caregiver will ask you about your symptoms. Your caregiver also will ask to provide a urine sample. The urine sample will be tested for bacteria and white blood cells. White blood cells are made by your body to help fight infection. TREATMENT  Typically, UTIs can be treated with medication. Because most UTIs are caused by a bacterial infection, they usually can be treated with the use of antibiotics. The choice of antibiotic and length of treatment depend on your symptoms and the type of bacteria causing your infection. HOME CARE INSTRUCTIONS  If you were prescribed antibiotics, take them exactly as your caregiver instructs you. Finish the medication even if you feel better  after you have only taken some of the medication.  Drink enough water and fluids to keep your urine clear or pale yellow.  Avoid caffeine, tea, and carbonated beverages. They tend to irritate your bladder.  Empty your bladder often. Avoid holding urine for long periods of time.  Empty your bladder before and after sexual intercourse.  After a bowel movement, women should cleanse from front to back. Use each tissue only once. SEEK MEDICAL CARE IF:   You have back pain.  You develop a fever.  Your symptoms do not begin to resolve within 3 days. SEEK IMMEDIATE MEDICAL CARE IF:   You have severe back pain or lower abdominal pain.  You develop chills.  You have nausea or vomiting.  You have continued burning or discomfort with urination. MAKE SURE YOU:   Understand these instructions.  Will watch your condition.  Will get help right away if you are not doing well or get worse. Document Released: 10/30/2004 Document Revised: 07/22/2011 Document Reviewed: 02/28/2011 [facility] Patient Information 2015 [place], [place]. This information is not intended to replace advice given to you by your health care provider. Make sure you discuss any questions you have with your health care provider.

## 2014-09-06 ENCOUNTER — Ambulatory Visit (INDEPENDENT_AMBULATORY_CARE_PROVIDER_SITE_OTHER): Payer: BLUE CROSS/BLUE SHIELD

## 2014-09-06 ENCOUNTER — Ambulatory Visit (INDEPENDENT_AMBULATORY_CARE_PROVIDER_SITE_OTHER): Payer: BLUE CROSS/BLUE SHIELD | Admitting: Podiatry

## 2014-09-06 ENCOUNTER — Encounter: Payer: Self-pay | Admitting: Podiatry

## 2014-09-06 VITALS — BP 115/75 | HR 89 | Resp 15

## 2014-09-06 DIAGNOSIS — M779 Enthesopathy, unspecified: Secondary | ICD-10-CM

## 2014-09-06 DIAGNOSIS — L84 Corns and callosities: Secondary | ICD-10-CM | POA: Diagnosis not present

## 2014-09-06 MED ORDER — TRIAMCINOLONE ACETONIDE 10 MG/ML IJ SUSP
10.0000 mg | Freq: Once | INTRAMUSCULAR | Status: AC
Start: 2014-09-06 — End: 2014-09-06
  Administered 2014-09-06: 10 mg

## 2014-09-06 NOTE — Progress Notes (Signed)
   Subjective:    Patient ID: Paula Cervantes, female    DOB: 08/16/1975, 39 y.o.   MRN: [id number]  HPI  Pt presents with bilateral callus  Review of Systems  Musculoskeletal: Positive for arthralgias and gait problem.  All other systems reviewed and are negative.      Objective:   Physical Exam        Assessment & Plan:

## 2014-09-06 NOTE — Progress Notes (Signed)
Subjective:     Patient ID: Paula Cervantes, female   DOB: 08/16/1975, 39 y.o.   MRN: [id number]  HPI patient presents with severe lesions plantar aspect left foot with the outside of the left foot being the worst upon palpation. States that they're getting worse over time and it's increasingly hard for her to be ambulatory   Review of Systems  All other systems reviewed and are negative.      Objective:   Physical Exam  Constitutional: She is oriented to person, place, and time.  Cardiovascular: Intact distal pulses.   Musculoskeletal: Normal range of motion.  Neurological: She is oriented to person, place, and time.  Skin: Skin is warm.  Nursing note and vitals reviewed.  neurovascular status intact muscle strength adequate with range of motion of the subtalar and midtarsal joint within normal limits. Patient's found to have severe keratotic lesion sub-fifth left with pain with fluid buildup and lesion sub-first left and on the left big toe. All lesions are very sore and nucleated with the left fifth MPJ being the worse with fluid accumulation. Patient is well oriented 3 and has good digital perfusion     Assessment:     Inflammatory capsulitis left fifth MPJ with severe keratotic lesion of the fifth metatarsal first metatarsal and big toe    Plan:     H&P and x-rays reviewed with patient of both feet. At this time were in a take a aggressive conservative approach and I did do a capsular injection left fifth MPJ 3 mg of dexamethasone analog 5 mg Xylocaine and then did deep debridement of all lesions. Ultimately this may require surgery and possible orthotics depending on response and we'll reevaluate again in 6 weeks

## 2014-10-23 ENCOUNTER — Ambulatory Visit: Payer: BLUE CROSS/BLUE SHIELD | Admitting: Podiatry

## 2015-04-05 ENCOUNTER — Encounter (HOSPITAL_COMMUNITY): Payer: Self-pay

## 2015-04-05 ENCOUNTER — Emergency Department (HOSPITAL_COMMUNITY)
Admission: EM | Admit: 2015-04-05 | Discharge: 2015-04-05 | Disposition: A | Discharge status: Home / Self Care | Payer: BLUE CROSS/BLUE SHIELD | Attending: Emergency Medicine | Admitting: Emergency Medicine

## 2015-04-05 DIAGNOSIS — Z86018 Personal history of other benign neoplasm: Secondary | ICD-10-CM | POA: Insufficient documentation

## 2015-04-05 DIAGNOSIS — M1990 Unspecified osteoarthritis, unspecified site: Secondary | ICD-10-CM | POA: Insufficient documentation

## 2015-04-05 DIAGNOSIS — J45909 Unspecified asthma, uncomplicated: Secondary | ICD-10-CM | POA: Insufficient documentation

## 2015-04-05 DIAGNOSIS — Z87891 Personal history of nicotine dependence: Secondary | ICD-10-CM | POA: Insufficient documentation

## 2015-04-05 DIAGNOSIS — M10061 Idiopathic gout, right knee: Secondary | ICD-10-CM | POA: Insufficient documentation

## 2015-04-05 DIAGNOSIS — Z8742 Personal history of other diseases of the female genital tract: Secondary | ICD-10-CM | POA: Insufficient documentation

## 2015-04-05 DIAGNOSIS — M25561 Pain in right knee: Secondary | ICD-10-CM | POA: Diagnosis present

## 2015-04-05 DIAGNOSIS — Z880 Allergy status to penicillin: Secondary | ICD-10-CM | POA: Diagnosis not present

## 2015-04-05 DIAGNOSIS — M109 Gout, unspecified: Secondary | ICD-10-CM

## 2015-04-05 DIAGNOSIS — Z792 Long term (current) use of antibiotics: Secondary | ICD-10-CM | POA: Insufficient documentation

## 2015-04-05 DIAGNOSIS — Z8639 Personal history of other endocrine, nutritional and metabolic disease: Secondary | ICD-10-CM | POA: Diagnosis not present

## 2015-04-05 MED ORDER — COLCHICINE 0.6 MG PO TABS: ORAL_TABLET | ORAL | Status: DC

## 2015-04-05 MED ORDER — HYDROCODONE-ACETAMINOPHEN 5-325 MG PO TABS: 1.0000 | ORAL_TABLET | Freq: Four times a day (QID) | ORAL | Status: DC | PRN

## 2015-04-05 NOTE — Discharge Instructions (Signed)
Take colchicine as directed, use pain medication only as needed - This can make you very drowsy - please do not drink or drive on this medication, continue usual home medications Please follow up with your primary doctor in 3 days for discussion of your diagnoses and further evaluation after today's visit. Return to the ER for any new or worsening symptoms, any additional concerns.

## 2015-04-05 NOTE — ED Provider Notes (Signed)
CSN: [id number]     Arrival date & time 04/05/15  2026 History  By signing my name below, I, Paula Cervantes, attest that this documentation has been prepared under the direction and in the presence of  [facility], PA-C. Electronically Signed: Hansel Cervantes, ED Scribe. 04/05/2015. 9:00 PM.    Chief Complaint  Patient presents with  . Gout   The history is provided by the patient. No language interpreter was used.   HPI Comments: Paula Cervantes is a 40 y.o. female with h/o gout of bilateral knees, arthritis who presents to the Emergency Department complaining of moderate, acute onset, gradually worsening right knee pain, swelling onset last night. Pt states her current pain feels similar to previous gout flare-ups, with the last flare-up being 5 years ago. She notes that her pain is worsened with ambulation, weight-bearing and movement. Patient was on daily medication for gout, however symptoms improved and she discontinued medication. Pt denies fever, numbness or paresthesia.   Past Medical History  Diagnosis Date  . Abnormal Pap smear     no cervical cancer, just h/o cryotherapy  . Fibroids   . Endometriosis   . Gout   . Asthma     no episodes/no inhaler needed  . Heartburn during pregnancy   . Lactose intolerance   . Arthritis    Past Surgical History  Procedure Laterality Date  . Ovarian fibroids    . Knee surgery    . Cryotherapy    . Diagnostic laparoscopy    . Cesarean section N/A 09/20/2012    Procedure: PRIMARY CESAREAN SECTION;  Surgeon: Margarette Asal, MD;  Location: [facility];  Service: Obstetrics;  Laterality: N/A;  Primary edc 8/25  . Cesarean section with bilateral tubal ligation Bilateral 02/09/2014    Procedure: CESAREAN SECTION WITH BILATERAL TUBAL LIGATION;  Surgeon: Logan Bores, MD;  Location: [facility];  Service: Obstetrics;  Laterality: Bilateral;   Family History  Problem Relation Age of Onset  . Alcohol abuse Neg Hx   . Arthritis Neg Hx   . Asthma Neg  Hx   . Birth defects Neg Hx   . Cancer Neg Hx   . COPD Neg Hx   . Depression Neg Hx   . Drug abuse Neg Hx   . Early death Neg Hx   . Hearing loss Neg Hx   . Kidney disease Neg Hx   . Learning disabilities Neg Hx   . Mental illness Neg Hx   . Mental retardation Neg Hx   . Miscarriages / Stillbirths Neg Hx   . Stroke Neg Hx   . Vision loss Neg Hx   . Varicose Veins Neg Hx   . Diabetes Mother   . Hypertension Mother   . Hyperlipidemia Mother   . Heart disease Maternal Grandmother    Social History  Substance Use Topics  . Smoking status: Former Smoker    Quit date: 03/27/2002  . Smokeless tobacco: Never Used  . Alcohol Use: No   OB History    Gravida Para Term Preterm AB TAB SAB Ectopic Multiple Living   3 2 2  1  1   0 2     Review of Systems  Constitutional: Negative for fever.  Musculoskeletal: Positive for joint swelling and arthralgias.  Neurological: Negative for numbness.   Allergies  Penicillins; Sulfa antibiotics; Aspirin; and Coconut flavor  Home Medications   Prior to Admission medications   Medication Sig Start Date End Date Taking? Authorizing Provider  ciprofloxacin (CIPRO) 500 MG tablet Take 1 tablet (500 mg total) by mouth 2 (two) times daily. 08/23/14   Roselee Culver, MD  colchicine (COLCRYS) 0.6 MG tablet Take two tablets for the first dose (1.2 mg), one hour later, take one tablet (0.6mg ).  Then, take twice daily. 04/05/15   Ozella Almond Ward, PA-C  HYDROcodone-acetaminophen (NORCO/VICODIN) 5-325 MG tablet Take 1 tablet by mouth every 6 (six) hours as needed for severe pain. 04/05/15   Ozella Almond Ward, PA-C  metoCLOPramide (REGLAN) 10 MG tablet Take 1 tablet (10 mg total) by mouth every 6 (six) hours as needed for nausea. [id number]   Delora Fuel, MD  oxyCODONE-acetaminophen (PERCOCET/ROXICET) 5-325 MG per tablet Take 1 tablet by mouth every 4 (four) hours as needed (for pain scale less than 7). 02/11/14   Newton Pigg, MD  phenazopyridine (PYRIDIUM)  200 MG tablet Take 1 tablet (200 mg total) by mouth 3 (three) times daily as needed. 08/23/14   Roselee Culver, MD  promethazine (PHENERGAN) 25 MG suppository Place 1 suppository (25 mg total) rectally every 6 (six) hours as needed for nausea or vomiting. [id number]   Delora Fuel, MD   BP [id number] mmHg  Pulse 103  Temp(Src) 98.3 F (36.8 C) (Oral)  Resp 18  Ht 5\' 7"  (1.702 m)  Wt 92.534 kg  BMI 31.94 kg/m2  SpO2 100%  LMP 04/04/2015 Physical Exam  Constitutional: She is oriented to person, place, and time. She appears well-developed and well-nourished.  HENT:  Head: Normocephalic and atraumatic.  Neck: Normal range of motion. Neck supple.  Cardiovascular: Normal rate, regular rhythm and normal heart sounds.  Exam reveals no gallop and no friction rub.   No murmur heard. Pulmonary/Chest: Effort normal and breath sounds normal. No respiratory distress. She has no wheezes. She has no rales.  Musculoskeletal: Normal range of motion. She exhibits tenderness.  Right knee mildly swollen, warm to the touch, TTP, decreased ROM secondary to pain. No erythema or obvious deformities noted.   Neurological: She is alert and oriented to person, place, and time.  Bilateral lower extremities NVI.   Psychiatric: She has a normal mood and affect. Her behavior is normal.  Nursing note and vitals reviewed.   ED Course  Procedures (including critical care time) DIAGNOSTIC STUDIES: Oxygen Saturation is 100% on RA, normal by my interpretation.    COORDINATION OF CARE: 8:57 PM Discussed treatment plan with pt at bedside which includes symptomatic therapy and pt agreed to plan.   MDM   Final diagnoses:  Acute gout of right knee, unspecified cause   [name] presents to the ED with gout flare-up of the right knee c/w prior gout attacks. She states that she is on daily medication for gout prophylaxis, however recently discontinued because she had not had a flare in a long period of time and no longer  has a PCP. Pt presents with monoarticular pain, swelling.  Pt is afebrile and stable. Pt dc with colchicine. Discussed that pt should respond to treatment with in 24 hour of begining treatment & likely resolve in 2-3 days.     I personally performed the services described in this documentation, which was scribed in my presence. The recorded information has been reviewed and is accurate.   [facility] Ward, PA-C 04/05/15 2113  Sherwood Gambler, MD 04/06/15 [phone]

## 2015-04-05 NOTE — ED Notes (Signed)
Pt c/o r knee pain, swelling to r knee, denies injury, hx of gout, has not has a flair up in a few years

## 2015-04-05 NOTE — ED Notes (Signed)
One touch- see PA note

## 2015-04-25 ENCOUNTER — Encounter (HOSPITAL_COMMUNITY): Payer: Self-pay | Admitting: Emergency Medicine

## 2015-04-25 ENCOUNTER — Emergency Department (HOSPITAL_COMMUNITY)
Admission: EM | Admit: 2015-04-25 | Discharge: 2015-04-25 | Disposition: A | Discharge status: Home / Self Care | Payer: BLUE CROSS/BLUE SHIELD | Attending: Emergency Medicine | Admitting: Emergency Medicine

## 2015-04-25 DIAGNOSIS — Z79899 Other long term (current) drug therapy: Secondary | ICD-10-CM | POA: Diagnosis not present

## 2015-04-25 DIAGNOSIS — Z792 Long term (current) use of antibiotics: Secondary | ICD-10-CM | POA: Diagnosis not present

## 2015-04-25 DIAGNOSIS — J45909 Unspecified asthma, uncomplicated: Secondary | ICD-10-CM | POA: Insufficient documentation

## 2015-04-25 DIAGNOSIS — M109 Gout, unspecified: Secondary | ICD-10-CM | POA: Diagnosis not present

## 2015-04-25 DIAGNOSIS — Z8742 Personal history of other diseases of the female genital tract: Secondary | ICD-10-CM | POA: Insufficient documentation

## 2015-04-25 DIAGNOSIS — Z880 Allergy status to penicillin: Secondary | ICD-10-CM | POA: Insufficient documentation

## 2015-04-25 DIAGNOSIS — J029 Acute pharyngitis, unspecified: Secondary | ICD-10-CM | POA: Diagnosis present

## 2015-04-25 DIAGNOSIS — M1990 Unspecified osteoarthritis, unspecified site: Secondary | ICD-10-CM | POA: Diagnosis not present

## 2015-04-25 DIAGNOSIS — Z86018 Personal history of other benign neoplasm: Secondary | ICD-10-CM | POA: Insufficient documentation

## 2015-04-25 DIAGNOSIS — Z87891 Personal history of nicotine dependence: Secondary | ICD-10-CM | POA: Insufficient documentation

## 2015-04-25 DIAGNOSIS — Z8639 Personal history of other endocrine, nutritional and metabolic disease: Secondary | ICD-10-CM | POA: Insufficient documentation

## 2015-04-25 MED ORDER — AZITHROMYCIN 250 MG PO TABS: 250.0000 mg | ORAL_TABLET | Freq: Every day | ORAL | Status: DC

## 2015-04-25 NOTE — ED Provider Notes (Signed)
CSN: [id number]     Arrival date & time 04/25/15  1017 History   First MD Initiated Contact with Patient 04/25/15 1026     Chief Complaint  Patient presents with  . Sore Throat     (Consider location/radiation/quality/duration/timing/severity/associated sxs/prior Treatment) Patient is a 40 y.o. female presenting with pharyngitis. The history is provided by the patient. No language interpreter was used.  Sore Throat This is a new problem. The current episode started in the past 7 days. The problem occurs constantly. The problem has been unchanged. Associated symptoms include a fever, myalgias and neck pain. The symptoms are aggravated by swallowing. She has tried nothing for the symptoms.    Past Medical History  Diagnosis Date  . Abnormal Pap smear     no cervical cancer, just h/o cryotherapy  . Fibroids   . Endometriosis   . Gout   . Asthma     no episodes/no inhaler needed  . Heartburn during pregnancy   . Lactose intolerance   . Arthritis    Past Surgical History  Procedure Laterality Date  . Ovarian fibroids    . Knee surgery    . Cryotherapy    . Diagnostic laparoscopy    . Cesarean section N/A 09/20/2012    Procedure: PRIMARY CESAREAN SECTION;  Surgeon: Margarette Asal, MD;  Location: [facility];  Service: Obstetrics;  Laterality: N/A;  Primary edc 8/25  . Cesarean section with bilateral tubal ligation Bilateral 02/09/2014    Procedure: CESAREAN SECTION WITH BILATERAL TUBAL LIGATION;  Surgeon: Logan Bores, MD;  Location: [facility];  Service: Obstetrics;  Laterality: Bilateral;   Family History  Problem Relation Age of Onset  . Alcohol abuse Neg Hx   . Arthritis Neg Hx   . Asthma Neg Hx   . Birth defects Neg Hx   . Cancer Neg Hx   . COPD Neg Hx   . Depression Neg Hx   . Drug abuse Neg Hx   . Early death Neg Hx   . Hearing loss Neg Hx   . Kidney disease Neg Hx   . Learning disabilities Neg Hx   . Mental illness Neg Hx   . Mental retardation Neg Hx   .  Miscarriages / Stillbirths Neg Hx   . Stroke Neg Hx   . Vision loss Neg Hx   . Varicose Veins Neg Hx   . Diabetes Mother   . Hypertension Mother   . Hyperlipidemia Mother   . Heart disease Maternal Grandmother    Social History  Substance Use Topics  . Smoking status: Former Smoker    Quit date: 03/27/2002  . Smokeless tobacco: Never Used  . Alcohol Use: No   OB History    Gravida Para Term Preterm AB TAB SAB Ectopic Multiple Living   3 2 2  1  1   0 2     Review of Systems  Constitutional: Positive for fever.  Musculoskeletal: Positive for myalgias and neck pain.  All other systems reviewed and are negative.     Allergies  Penicillins; Sulfa antibiotics; Aspirin; and Coconut flavor  Home Medications   Prior to Admission medications   Medication Sig Start Date End Date Taking? Authorizing Provider  ciprofloxacin (CIPRO) 500 MG tablet Take 1 tablet (500 mg total) by mouth 2 (two) times daily. 08/23/14   Roselee Culver, MD  colchicine (COLCRYS) 0.6 MG tablet Take two tablets for the first dose (1.2 mg), one hour later, take one tablet (0.6mg ).  Then, take twice daily. 04/05/15   Ozella Almond Ward, PA-C  HYDROcodone-acetaminophen (NORCO/VICODIN) 5-325 MG tablet Take 1 tablet by mouth every 6 (six) hours as needed for severe pain. 04/05/15   Ozella Almond Ward, PA-C  metoCLOPramide (REGLAN) 10 MG tablet Take 1 tablet (10 mg total) by mouth every 6 (six) hours as needed for nausea. [id number]   Delora Fuel, MD  oxyCODONE-acetaminophen (PERCOCET/ROXICET) 5-325 MG per tablet Take 1 tablet by mouth every 4 (four) hours as needed (for pain scale less than 7). 02/11/14   Newton Pigg, MD  phenazopyridine (PYRIDIUM) 200 MG tablet Take 1 tablet (200 mg total) by mouth 3 (three) times daily as needed. 08/23/14   Roselee Culver, MD  promethazine (PHENERGAN) 25 MG suppository Place 1 suppository (25 mg total) rectally every 6 (six) hours as needed for nausea or vomiting. [id number]   Delora Fuel, MD   LMP [id number] Physical Exam  Constitutional: She is oriented to person, place, and time. She appears well-nourished.  HENT:  Right Ear: External ear normal.  Left Ear: External ear normal.  Mouth/Throat: Oropharyngeal exudate and posterior oropharyngeal erythema present.  Neck:  No meningeal symptoms  Cardiovascular: Normal rate and regular rhythm.   Pulmonary/Chest: Effort normal and breath sounds normal.  Musculoskeletal: Normal range of motion.  Neurological: She is alert and oriented to person, place, and time. Coordination normal.  Skin: Skin is warm and dry.  Psychiatric: She has a normal mood and affect.  Nursing note and vitals reviewed.   ED Course  Procedures (including critical care time) Labs Review Labs Reviewed - No data to display  Imaging Review No results found. I have personally reviewed and evaluated these images and lab results as part of my medical decision-making.   EKG Interpretation None      MDM   Final diagnoses:  Pharyngitis    Pt has family with strep. Will treat based on clinical symptoms. Pt has pcn allergy will treat with zithromax    Glendell Docker, NP 04/25/15 1037  Elnora Morrison, MD 04/25/15 1719

## 2015-04-25 NOTE — ED Notes (Signed)
See PA assessment 

## 2015-04-25 NOTE — ED Notes (Signed)
Patient states sore throat since Saturday.   Patient states fever from Saturday.

## 2015-04-25 NOTE — Discharge Instructions (Signed)

## 2015-10-02 ENCOUNTER — Ambulatory Visit (HOSPITAL_COMMUNITY)
Admission: EM | Admit: 2015-10-02 | Discharge: 2015-10-02 | Disposition: A | Discharge status: Home / Self Care | Payer: BLUE CROSS/BLUE SHIELD | Attending: Family Medicine | Admitting: Family Medicine

## 2015-10-02 ENCOUNTER — Encounter (HOSPITAL_COMMUNITY): Payer: Self-pay | Admitting: Emergency Medicine

## 2015-10-02 DIAGNOSIS — G43009 Migraine without aura, not intractable, without status migrainosus: Secondary | ICD-10-CM | POA: Diagnosis not present

## 2015-10-02 MED ORDER — KETOROLAC TROMETHAMINE 60 MG/2ML IM SOLN
INTRAMUSCULAR | Status: AC
  Filled 2015-10-02: qty 2

## 2015-10-02 MED ORDER — ONDANSETRON 4 MG PO TBDP
4.0000 mg | ORAL_TABLET | Freq: Once | ORAL | Status: AC
  Administered 2015-10-02: 4 mg via ORAL

## 2015-10-02 MED ORDER — KETOROLAC TROMETHAMINE 60 MG/2ML IM SOLN
60.0000 mg | Freq: Once | INTRAMUSCULAR | Status: AC
  Administered 2015-10-02: 60 mg via INTRAMUSCULAR

## 2015-10-02 MED ORDER — SUMATRIPTAN SUCCINATE 100 MG PO TABS: 100.0000 mg | ORAL_TABLET | ORAL | 0 refills | Status: DC | PRN

## 2015-10-02 MED ORDER — ONDANSETRON 4 MG PO TBDP
ORAL_TABLET | ORAL | Status: AC
  Filled 2015-10-02: qty 1

## 2015-10-02 MED ORDER — METHYLPREDNISOLONE ACETATE 80 MG/ML IJ SUSP
80.0000 mg | Freq: Once | INTRAMUSCULAR | Status: AC
  Administered 2015-10-02: 80 mg via INTRAMUSCULAR

## 2015-10-02 MED ORDER — METHYLPREDNISOLONE ACETATE 80 MG/ML IJ SUSP
INTRAMUSCULAR | Status: AC
  Filled 2015-10-02: qty 1

## 2015-10-02 NOTE — ED Provider Notes (Signed)
CSN: [id number]     Arrival date & time 10/02/15  1934 History   None    Chief Complaint  Patient presents with  . Migraine   (Consider location/radiation/quality/duration/timing/severity/associated sxs/prior Treatment) Patient c/o HA that started yesterday.  She has phonophotophobia and she ahs hx of migraines.  She used to take imitrex in past for abortive measures.  She is nauseated.   The history is provided by the patient.  Migraine  This is a new problem. The current episode started yesterday. The problem occurs constantly. The problem has not changed since onset.Associated symptoms include headaches. Nothing aggravates the symptoms. She has tried nothing for the symptoms.    Past Medical History:  Diagnosis Date  . Abnormal Pap smear    no cervical cancer, just h/o cryotherapy  . Arthritis   . Asthma    no episodes/no inhaler needed  . Endometriosis   . Fibroids   . Gout   . Heartburn during pregnancy   . Lactose intolerance    Past Surgical History:  Procedure Laterality Date  . CESAREAN SECTION N/A 09/20/2012   Procedure: PRIMARY CESAREAN SECTION;  Surgeon: Margarette Asal, MD;  Location: [facility];  Service: Obstetrics;  Laterality: N/A;  Primary edc 8/25  . CESAREAN SECTION WITH BILATERAL TUBAL LIGATION Bilateral 02/09/2014   Procedure: CESAREAN SECTION WITH BILATERAL TUBAL LIGATION;  Surgeon: Logan Bores, MD;  Location: [facility];  Service: Obstetrics;  Laterality: Bilateral;  . CRYOTHERAPY    . DIAGNOSTIC LAPAROSCOPY    . KNEE SURGERY    . ovarian fibroids     Family History  Problem Relation Age of Onset  . Alcohol abuse Neg Hx   . Arthritis Neg Hx   . Asthma Neg Hx   . Birth defects Neg Hx   . Cancer Neg Hx   . COPD Neg Hx   . Depression Neg Hx   . Drug abuse Neg Hx   . Early death Neg Hx   . Hearing loss Neg Hx   . Kidney disease Neg Hx   . Learning disabilities Neg Hx   . Mental illness Neg Hx   . Mental retardation Neg Hx   . Miscarriages /  Stillbirths Neg Hx   . Stroke Neg Hx   . Vision loss Neg Hx   . Varicose Veins Neg Hx   . Diabetes Mother   . Hypertension Mother   . Hyperlipidemia Mother   . Heart disease Maternal Grandmother    Social History  Substance Use Topics  . Smoking status: Former Smoker    Quit date: 03/27/2002  . Smokeless tobacco: Never Used  . Alcohol use No   OB History    Gravida Para Term Preterm AB Living   3 2 2   1 2    SAB TAB Ectopic Multiple Live Births   1     0 2     Review of Systems  Constitutional: Negative.   Eyes: Negative.   Respiratory: Negative.   Cardiovascular: Negative.   Gastrointestinal: Negative.   Endocrine: Negative.   Genitourinary: Negative.   Musculoskeletal: Negative.   Skin: Negative.   Allergic/Immunologic: Negative.   Neurological: Positive for headaches.  Hematological: Negative.   Psychiatric/Behavioral: Negative.     Allergies  Penicillins; Sulfa antibiotics; Aspirin; and Coconut flavor  Home Medications   Prior to Admission medications   Medication Sig Start Date End Date Taking? Authorizing Provider  azithromycin (ZITHROMAX) 250 MG tablet Take 1 tablet (250 mg total)  by mouth daily. Take first 2 tablets together, then 1 every day until finished. 04/25/15   Glendell Docker, NP  ciprofloxacin (CIPRO) 500 MG tablet Take 1 tablet (500 mg total) by mouth 2 (two) times daily. 08/23/14   Roselee Culver, MD  colchicine (COLCRYS) 0.6 MG tablet Take two tablets for the first dose (1.2 mg), one hour later, take one tablet (0.6mg ).  Then, take twice daily. 04/05/15   Ozella Almond Ward, PA-C  HYDROcodone-acetaminophen (NORCO/VICODIN) 5-325 MG tablet Take 1 tablet by mouth every 6 (six) hours as needed for severe pain. 04/05/15   Ozella Almond Ward, PA-C  metoCLOPramide (REGLAN) 10 MG tablet Take 1 tablet (10 mg total) by mouth every 6 (six) hours as needed for nausea. [id number]   Delora Fuel, MD  oxyCODONE-acetaminophen (PERCOCET/ROXICET) 5-325 MG per tablet  Take 1 tablet by mouth every 4 (four) hours as needed (for pain scale less than 7). 02/11/14   Newton Pigg, MD  phenazopyridine (PYRIDIUM) 200 MG tablet Take 1 tablet (200 mg total) by mouth 3 (three) times daily as needed. 08/23/14   Roselee Culver, MD  promethazine (PHENERGAN) 25 MG suppository Place 1 suppository (25 mg total) rectally every 6 (six) hours as needed for nausea or vomiting. [id number]   Delora Fuel, MD   Meds Ordered and Administered this Visit  Medications - No data to display  BP 131/76 (BP Location: Left Arm)   Pulse 90   Temp 98.6 F (37 C) (Oral)   Resp 14   LMP 09/17/2015 (Exact Date)   SpO2 100%  No data found.   Physical Exam  Constitutional: She is oriented to person, place, and time. She appears well-developed and well-nourished.  HENT:  Head: Normocephalic and atraumatic.  Right Ear: External ear normal.  Left Ear: External ear normal.  Nose: Nose normal.  Mouth/Throat: Oropharynx is clear and moist.  Eyes: Conjunctivae and EOM are normal. Pupils are equal, round, and reactive to light.  Neck: Normal range of motion. Neck supple.  Cardiovascular: Normal rate, regular rhythm, normal heart sounds and intact distal pulses.   Pulmonary/Chest: Effort normal.  Abdominal: Soft. Bowel sounds are normal.  Musculoskeletal: Normal range of motion.  Neurological: She is alert and oriented to person, place, and time.  Nursing note and vitals reviewed.   Urgent Care Course   Clinical Course    Procedures (including critical care time)  Labs Review Labs Reviewed - No data to display  Imaging Review No results found.   Visual Acuity Review  Right Eye Distance:   Left Eye Distance:   Bilateral Distance:    Right Eye Near:   Left Eye Near:    Bilateral Near:         MDM  Migraine Headache  Methylprednisone 80mg  IM Toradol 60mg  IM Zofran 4mg  po  Imitrex 100mg  one po with onset of HA and repeat in 2 hours prn #10  Push po fluids, rest,  tylenol and motrin otc prn as directed for fever, arthralgias, and myalgias.  Follow up prn if sx's continue or persist.    Lysbeth Penner, FNP 10/02/15 2040

## 2015-10-02 NOTE — ED Triage Notes (Signed)
The patient presented to the [facility] with a complaint of a headache with nausea that started yesterday. The patient reported a hx of migraines.

## 2016-02-03 ENCOUNTER — Emergency Department (HOSPITAL_COMMUNITY): Payer: BLUE CROSS/BLUE SHIELD

## 2016-02-03 ENCOUNTER — Emergency Department (HOSPITAL_COMMUNITY)
Admission: EM | Admit: 2016-02-03 | Discharge: 2016-02-03 | Disposition: A | Discharge status: Home / Self Care | Payer: BLUE CROSS/BLUE SHIELD | Attending: Emergency Medicine | Admitting: Emergency Medicine

## 2016-02-03 ENCOUNTER — Encounter (HOSPITAL_COMMUNITY): Payer: Self-pay

## 2016-02-03 DIAGNOSIS — Y929 Unspecified place or not applicable: Secondary | ICD-10-CM | POA: Insufficient documentation

## 2016-02-03 DIAGNOSIS — Y999 Unspecified external cause status: Secondary | ICD-10-CM | POA: Diagnosis not present

## 2016-02-03 DIAGNOSIS — Z79899 Other long term (current) drug therapy: Secondary | ICD-10-CM | POA: Insufficient documentation

## 2016-02-03 DIAGNOSIS — S42291A Other displaced fracture of upper end of right humerus, initial encounter for closed fracture: Secondary | ICD-10-CM | POA: Insufficient documentation

## 2016-02-03 DIAGNOSIS — S43014A Anterior dislocation of right humerus, initial encounter: Secondary | ICD-10-CM | POA: Diagnosis not present

## 2016-02-03 DIAGNOSIS — S43004A Unspecified dislocation of right shoulder joint, initial encounter: Secondary | ICD-10-CM | POA: Diagnosis not present

## 2016-02-03 DIAGNOSIS — Y939 Activity, unspecified: Secondary | ICD-10-CM | POA: Insufficient documentation

## 2016-02-03 DIAGNOSIS — S43001A Unspecified subluxation of right shoulder joint, initial encounter: Secondary | ICD-10-CM | POA: Diagnosis not present

## 2016-02-03 DIAGNOSIS — Z87891 Personal history of nicotine dependence: Secondary | ICD-10-CM | POA: Diagnosis not present

## 2016-02-03 DIAGNOSIS — S43034A Inferior dislocation of right humerus, initial encounter: Secondary | ICD-10-CM | POA: Insufficient documentation

## 2016-02-03 DIAGNOSIS — S4991XA Unspecified injury of right shoulder and upper arm, initial encounter: Secondary | ICD-10-CM | POA: Diagnosis not present

## 2016-02-03 DIAGNOSIS — J45909 Unspecified asthma, uncomplicated: Secondary | ICD-10-CM | POA: Insufficient documentation

## 2016-02-03 DIAGNOSIS — S42201A Unspecified fracture of upper end of right humerus, initial encounter for closed fracture: Secondary | ICD-10-CM | POA: Diagnosis not present

## 2016-02-03 MED ORDER — HYDROCODONE-ACETAMINOPHEN 5-325 MG PO TABS: 1.0000 | ORAL_TABLET | ORAL | 0 refills | Status: DC | PRN

## 2016-02-03 MED ORDER — FENTANYL CITRATE (PF) 100 MCG/2ML IJ SOLN
25.0000 ug | Freq: Once | INTRAMUSCULAR | Status: AC
  Administered 2016-02-03: 25 ug via INTRAVENOUS
  Filled 2016-02-03: qty 2

## 2016-02-03 MED ORDER — PROPOFOL 10 MG/ML IV BOLUS
100.0000 mg | Freq: Once | INTRAVENOUS | Status: AC
  Administered 2016-02-03: 100 mg via INTRAVENOUS
  Filled 2016-02-03: qty 20

## 2016-02-03 MED ORDER — DIAZEPAM 5 MG PO TABS
5.0000 mg | ORAL_TABLET | Freq: Once | ORAL | Status: AC
  Administered 2016-02-03: 5 mg via ORAL
  Filled 2016-02-03: qty 1

## 2016-02-03 NOTE — ED Notes (Signed)
PT ABLE TO AMBULATE TO THE BATHROOM W/O ASSISTANCE.

## 2016-02-03 NOTE — ED Notes (Signed)
Bed: WA07 Expected date:  Expected time:  Means of arrival:  Comments: Triage 1  

## 2016-02-03 NOTE — Discharge Instructions (Signed)
Read the information below.  Use the prescribed medication as directed.  Please discuss all new medications with your pharmacist.  Do not take additional tylenol while taking the prescribed pain medication to avoid overdose.  You may return to the Emergency Department at any time for worsening condition or any new symptoms that concern you.    If you develop uncontrolled pain, weakness or numbness of the extremity, severe discoloration of the skin, or you are unable to move your arm, return to the ER for a recheck.    °
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
## 2016-02-07 DIAGNOSIS — M25311 Other instability, right shoulder: Secondary | ICD-10-CM | POA: Diagnosis not present

## 2016-02-11 NOTE — ED Provider Notes (Signed)
.  Sedation Date/Time: 02/03/2016 5:40 PM Performed by: Charlesetta Shanks Authorized by: Charlesetta Shanks   Consent:    Consent obtained:  Written   Consent given by:  Patient   Risks discussed:  Allergic reaction, prolonged hypoxia resulting in organ damage, prolonged sedation necessitating reversal, respiratory compromise necessitating ventilatory assistance and intubation, vomiting, inadequate sedation and nausea Indications:    Procedure performed:  Chest tube placement   Procedure necessitating sedation performed by:  Physician performing sedation   Intended level of sedation:  Moderate (conscious sedation) Pre-sedation assessment:    ASA classification: class 1 - normal, healthy patient     Neck mobility: normal     Mouth opening:  3 or more finger widths   Mallampati score:  I - soft palate, uvula, fauces, pillars visible   Pre-sedation assessments completed and reviewed: airway patency, cardiovascular function, hydration status, mental status, nausea/vomiting, pain level, respiratory function and temperature     History of difficult intubation: no   Immediate pre-procedure details:    Reviewed: vital signs     Verified: bag valve mask available, emergency equipment available, intubation equipment available, IV patency confirmed, oxygen available, reversal medications available and suction available   Procedure details (see MAR for exact dosages):    Preoxygenation:  Nasal cannula   Sedation:  Propofol   Intra-procedure monitoring:  Blood pressure monitoring, cardiac monitor, continuous capnometry, continuous pulse oximetry, frequent LOC assessments and frequent vital sign checks   Intra-procedure events: none   Post-procedure details:    Post-sedation assessment completed:  02/03/2016 6:30 PM   Attendance: Constant attendance by certified staff until patient recovered     Recovery: Patient returned to pre-procedure baseline     Post-sedation assessments completed and reviewed:  airway patency, cardiovascular function, hydration status, mental status, nausea/vomiting, pain level and respiratory function     Patient is stable for discharge or admission: yes     Patient tolerance:  Tolerated well, no immediate complications Reduction of dislocation Date/Time: 02/03/2016 5:40 PM Performed by: Charlesetta Shanks Authorized by: Charlesetta Shanks  Consent: Written consent obtained. Consent given by: patient Patient identity confirmed: verbally with patient and arm band Local anesthesia used: no  Anesthesia: Local anesthesia used: no  Sedation: Patient sedated: yes Sedation type: moderate (conscious) sedation Sedatives: propofol  Patient tolerance: Patient tolerated the procedure well with no immediate complications Comments: Uncomplicated reduction of shoulder by traction and abduction. Neurovascular intact post reduction.      Charlesetta Shanks, MD 02/11/16 [phone]

## 2016-03-17 DIAGNOSIS — M25311 Other instability, right shoulder: Secondary | ICD-10-CM | POA: Diagnosis not present

## 2016-03-31 DIAGNOSIS — M25511 Pain in right shoulder: Secondary | ICD-10-CM | POA: Diagnosis not present

## 2016-04-04 DIAGNOSIS — M25511 Pain in right shoulder: Secondary | ICD-10-CM | POA: Diagnosis not present

## 2016-04-08 DIAGNOSIS — M25511 Pain in right shoulder: Secondary | ICD-10-CM | POA: Diagnosis not present

## 2016-04-15 DIAGNOSIS — M25511 Pain in right shoulder: Secondary | ICD-10-CM | POA: Diagnosis not present

## 2016-04-18 DIAGNOSIS — M25511 Pain in right shoulder: Secondary | ICD-10-CM | POA: Diagnosis not present

## 2016-04-22 DIAGNOSIS — M25511 Pain in right shoulder: Secondary | ICD-10-CM | POA: Diagnosis not present

## 2016-04-25 DIAGNOSIS — M25511 Pain in right shoulder: Secondary | ICD-10-CM | POA: Diagnosis not present

## 2016-04-28 DIAGNOSIS — M25311 Other instability, right shoulder: Secondary | ICD-10-CM | POA: Diagnosis not present

## 2016-06-12 ENCOUNTER — Ambulatory Visit (HOSPITAL_COMMUNITY)
Admission: EM | Admit: 2016-06-12 | Discharge: 2016-06-12 | Disposition: A | Discharge status: Home / Self Care | Payer: BLUE CROSS/BLUE SHIELD | Attending: Family | Admitting: Family

## 2016-06-12 ENCOUNTER — Ambulatory Visit: Payer: BLUE CROSS/BLUE SHIELD | Admitting: Emergency Medicine

## 2016-06-12 ENCOUNTER — Encounter (HOSPITAL_COMMUNITY): Payer: Self-pay | Admitting: Emergency Medicine

## 2016-06-12 DIAGNOSIS — J40 Bronchitis, not specified as acute or chronic: Secondary | ICD-10-CM | POA: Diagnosis not present

## 2016-06-12 DIAGNOSIS — J0140 Acute pansinusitis, unspecified: Secondary | ICD-10-CM | POA: Diagnosis not present

## 2016-06-12 MED ORDER — DOXYCYCLINE HYCLATE 100 MG PO CAPS
100.0000 mg | ORAL_CAPSULE | Freq: Two times a day (BID) | ORAL | 0 refills | Status: AC
Start: 2016-06-12 — End: 2016-06-19

## 2016-06-12 MED ORDER — HYDROCOD POLST-CPM POLST ER 10-8 MG/5ML PO SUER: 5.0000 mL | Freq: Every evening | ORAL | 0 refills | Status: DC | PRN

## 2016-06-12 NOTE — ED Provider Notes (Signed)
CSN: [id number]     Arrival date & time 06/12/16  1121 History   First MD Initiated Contact with Patient 06/12/16 1214     Chief Complaint  Patient presents with  . URI   (Consider location/radiation/quality/duration/timing/severity/associated sxs/prior Treatment) 41 year old female presents with nasal congestion, productive cough with discolored mucus, low grade fevers, chills and headache for over 1 1/2 weeks. Also having a sore throat and has vomited due to the cough. Has been taking Mucinex and Tylenol with minimal relief. Has tried Tessalon cough pills in the past with no success. No other chronic health issues except migraine headaches. Takes no daily medication.    The history is provided by the patient.    Past Medical History:  Diagnosis Date  . Abnormal Pap smear    no cervical cancer, just h/o cryotherapy  . Arthritis   . Asthma    no episodes/no inhaler needed  . Endometriosis   . Fibroids   . Gout   . Heartburn during pregnancy   . Lactose intolerance    Past Surgical History:  Procedure Laterality Date  . CESAREAN SECTION N/A 09/20/2012   Procedure: PRIMARY CESAREAN SECTION;  Surgeon: Margarette Asal, MD;  Location: [facility];  Service: Obstetrics;  Laterality: N/A;  Primary edc 8/25  . CESAREAN SECTION WITH BILATERAL TUBAL LIGATION Bilateral 02/09/2014   Procedure: CESAREAN SECTION WITH BILATERAL TUBAL LIGATION;  Surgeon: Logan Bores, MD;  Location: [facility];  Service: Obstetrics;  Laterality: Bilateral;  . CRYOTHERAPY    . DIAGNOSTIC LAPAROSCOPY    . KNEE SURGERY    . ovarian fibroids     Family History  Problem Relation Age of Onset  . Diabetes Mother   . Hypertension Mother   . Hyperlipidemia Mother   . Heart disease Maternal Grandmother   . Alcohol abuse Neg Hx   . Arthritis Neg Hx   . Asthma Neg Hx   . Birth defects Neg Hx   . Cancer Neg Hx   . COPD Neg Hx   . Depression Neg Hx   . Drug abuse Neg Hx   . Early death Neg Hx   . Hearing loss Neg  Hx   . Kidney disease Neg Hx   . Learning disabilities Neg Hx   . Mental illness Neg Hx   . Mental retardation Neg Hx   . Miscarriages / Stillbirths Neg Hx   . Stroke Neg Hx   . Vision loss Neg Hx   . Varicose Veins Neg Hx    Social History  Substance Use Topics  . Smoking status: Former Smoker    Quit date: 03/27/2002  . Smokeless tobacco: Never Used  . Alcohol use No   OB History    Gravida Para Term Preterm AB Living   3 2 2   1 2    SAB TAB Ectopic Multiple Live Births   1     0 2     Review of Systems  Constitutional: Positive for appetite change, chills, fatigue and fever.  HENT: Positive for congestion, postnasal drip, rhinorrhea, sinus pain, sinus pressure and sore throat. Negative for ear discharge, ear pain, facial swelling, mouth sores and trouble swallowing.   Eyes: Negative for discharge and itching.  Respiratory: Positive for cough. Negative for chest tightness, shortness of breath and wheezing.   Cardiovascular: Negative for chest pain.  Gastrointestinal: Positive for vomiting. Negative for abdominal pain, diarrhea and nausea.  Musculoskeletal: Negative for arthralgias, back pain, myalgias, neck pain  and neck stiffness.  Skin: Negative for rash and wound.  Neurological: Positive for headaches. Negative for dizziness, syncope, weakness, light-headedness and numbness.  Hematological: Negative for adenopathy.    Allergies  Penicillins; Sulfa antibiotics; Aspirin; and Coconut flavor  Home Medications   Prior to Admission medications   Medication Sig Start Date End Date Taking? Authorizing Provider  chlorpheniramine-HYDROcodone (TUSSIONEX PENNKINETIC ER) 10-8 MG/5ML SUER Take 5 mLs by mouth at bedtime as needed for cough. 06/12/16   Katy Apo, NP  doxycycline (VIBRAMYCIN) 100 MG capsule Take 1 capsule (100 mg total) by mouth 2 (two) times daily. 06/12/16 06/19/16  Katy Apo, NP   Meds Ordered and Administered this Visit  Medications - No data to  display  BP 122/88 (BP Location: Left Arm)   Pulse 86   Temp 98.2 F (36.8 C) (Oral)   Resp 20   LMP 06/04/2016   SpO2 100%  No data found.   Physical Exam  Constitutional: She is oriented to person, place, and time. She appears well-developed and well-nourished. She appears ill. No distress.  HENT:  Head: Normocephalic and atraumatic.  Right Ear: Hearing, external ear and ear canal normal. Tympanic membrane is bulging. Tympanic membrane is not injected and not erythematous. A middle ear effusion is present.  Left Ear: Hearing, external ear and ear canal normal. Tympanic membrane is bulging. Tympanic membrane is not injected and not erythematous. A middle ear effusion is present.  Nose: Mucosal edema and rhinorrhea present. Right sinus exhibits maxillary sinus tenderness and frontal sinus tenderness. Left sinus exhibits maxillary sinus tenderness and frontal sinus tenderness.  Mouth/Throat: Uvula is midline and mucous membranes are normal. Posterior oropharyngeal erythema present.  Neck: Normal range of motion. Neck supple.  Cardiovascular: Normal rate, regular rhythm and normal heart sounds.   No murmur heard. Pulmonary/Chest: Effort normal. No respiratory distress. She has no decreased breath sounds. She has no wheezes. She has rhonchi in the right upper field, the right lower field, the left upper field and the left lower field. She has no rales.  Lymphadenopathy:    She has no cervical adenopathy.  Neurological: She is alert and oriented to person, place, and time.  Skin: Skin is warm and dry. Capillary refill takes less than 2 seconds. No rash noted.  Psychiatric: She has a normal mood and affect. Her behavior is normal. Judgment and thought content normal.    Urgent Care Course     Procedures (including critical care time)  Labs Review Labs Reviewed - No data to display  Imaging Review No results found.   Visual Acuity Review  Right Eye Distance:   Left Eye  Distance:   Bilateral Distance:    Right Eye Near:   Left Eye Near:    Bilateral Near:         MDM   1. Acute non-recurrent pansinusitis   2. Bronchitis    Recommend start Doxycycline 100mg  twice a day as directed. May continue Mucinex as needed for cough. May also try Tussionex 1 teaspoon at bedtime to help with cough. [place] Controlled Substance Registry reviewed- last Rx was for hydrocodone and Tramadol in end of December, beginning of January 2018 due to shoulder dislocation. No active Rx currently. Recommend increase fluid intake to help loosen up mucus. May take Tylenol as needed for pain or fever. Follow-up in 3 to 4 days if not improving.      Katy Apo, NP 06/12/16 [phone]

## 2016-06-12 NOTE — Discharge Instructions (Addendum)
Recommend start Doxycycline 100mg  twice a day as directed. May continue Mucinex as needed for cough. May also try Tussionex 1 teaspoon at bedtime to help with cough. Increase fluid intake to help loosen up mucus. May take Tylenol as needed for pain or fever. Follow-up in 3 to 4 days if not improving.

## 2016-06-12 NOTE — ED Triage Notes (Signed)
Pt c/o cold sx onset: 9 days  Sx include: fevers, prod cough, vomiting due cough, BA, ST due to cough  Taking: OTC cold meds w/temp relief.   A&O x4... NAD

## 2016-10-03 ENCOUNTER — Ambulatory Visit (HOSPITAL_COMMUNITY)
Admission: EM | Admit: 2016-10-03 | Discharge: 2016-10-03 | Disposition: A | Discharge status: Home / Self Care | Payer: BLUE CROSS/BLUE SHIELD | Attending: Physician Assistant | Admitting: Physician Assistant

## 2016-10-03 ENCOUNTER — Encounter (HOSPITAL_COMMUNITY): Payer: Self-pay | Admitting: *Deleted

## 2016-10-03 DIAGNOSIS — N898 Other specified noninflammatory disorders of vagina: Secondary | ICD-10-CM | POA: Insufficient documentation

## 2016-10-03 DIAGNOSIS — Z87891 Personal history of nicotine dependence: Secondary | ICD-10-CM | POA: Insufficient documentation

## 2016-10-03 DIAGNOSIS — L298 Other pruritus: Secondary | ICD-10-CM | POA: Insufficient documentation

## 2016-10-03 DIAGNOSIS — Z79899 Other long term (current) drug therapy: Secondary | ICD-10-CM | POA: Insufficient documentation

## 2016-10-03 DIAGNOSIS — Z880 Allergy status to penicillin: Secondary | ICD-10-CM | POA: Insufficient documentation

## 2016-10-03 MED ORDER — HYDROXYZINE HCL 25 MG PO TABS: 25.0000 mg | ORAL_TABLET | Freq: Three times a day (TID) | ORAL | 0 refills | Status: AC

## 2016-10-03 MED ORDER — FLUCONAZOLE 150 MG PO TABS
150.0000 mg | ORAL_TABLET | Freq: Every day | ORAL | 0 refills | Status: DC
Start: 2016-10-03 — End: 2017-01-22

## 2016-10-03 MED ORDER — METRONIDAZOLE 500 MG PO TABS: 500.0000 mg | ORAL_TABLET | Freq: Two times a day (BID) | ORAL | 0 refills | Status: DC

## 2016-10-03 NOTE — ED Provider Notes (Signed)
[facility]    CSN: [id number] Arrival date & time: 10/03/16  1605     History   Chief Complaint Chief Complaint  Patient presents with  . Vaginal Itching    HPI Paula Cervantes is a 41 y.o. female.   41 year old female comes in for few day history of vaginal itching/irritation. She has noticed possible discharge. Denies abdominal pain, nausea, vomiting, diarrhea, constipation. Denies urinary symptoms such as frequency, dysuria, hematuria. LMP 09/20/2016. Patient states possible irritation from contact with object for self pleasure. She is sexually active with one partner, who she is married to, without condom use. Denies fever, chills, night sweats.       Past Medical History:  Diagnosis Date  . Abnormal Pap smear    no cervical cancer, just h/o cryotherapy  . Arthritis   . Asthma    no episodes/no inhaler needed  . Endometriosis   . Fibroids   . Gout   . Heartburn during pregnancy   . Lactose intolerance     Patient Active Problem List   Diagnosis Date Noted  . Migraine 08/23/2014  . S/P repeat low transverse C-section 02/09/2014    Past Surgical History:  Procedure Laterality Date  . CESAREAN SECTION N/A 09/20/2012   Procedure: PRIMARY CESAREAN SECTION;  Surgeon: Margarette Asal, MD;  Location: [facility];  Service: Obstetrics;  Laterality: N/A;  Primary edc 8/25  . CESAREAN SECTION WITH BILATERAL TUBAL LIGATION Bilateral 02/09/2014   Procedure: CESAREAN SECTION WITH BILATERAL TUBAL LIGATION;  Surgeon: Logan Bores, MD;  Location: [facility];  Service: Obstetrics;  Laterality: Bilateral;  . CRYOTHERAPY    . DIAGNOSTIC LAPAROSCOPY    . KNEE SURGERY    . ovarian fibroids      OB History    Gravida Para Term Preterm AB Living   3 2 2   1 2    SAB TAB Ectopic Multiple Live Births   1     0 2       Home Medications    Prior to Admission medications   Medication Sig Start Date End Date Taking? Authorizing Provider  chlorpheniramine-HYDROcodone  (TUSSIONEX PENNKINETIC ER) 10-8 MG/5ML SUER Take 5 mLs by mouth at bedtime as needed for cough. 06/12/16   Katy Apo, NP  fluconazole (DIFLUCAN) 150 MG tablet Take 1 tablet (150 mg total) by mouth daily. Take second dose 72 hours later if symptoms still persists. 10/03/16   Tasia Catchings, Amy V, PA-C  hydrOXYzine (ATARAX/VISTARIL) 25 MG tablet Take 1 tablet (25 mg total) by mouth 3 (three) times daily. 10/03/16 10/08/16  Tasia Catchings, Amy V, PA-C  metroNIDAZOLE (FLAGYL) 500 MG tablet Take 1 tablet (500 mg total) by mouth 2 (two) times daily. 10/03/16   Ok Edwards, PA-C    Family History Family History  Problem Relation Age of Onset  . Diabetes Mother   . Hypertension Mother   . Hyperlipidemia Mother   . Heart disease Maternal Grandmother   . Alcohol abuse Neg Hx   . Arthritis Neg Hx   . Asthma Neg Hx   . Birth defects Neg Hx   . Cancer Neg Hx   . COPD Neg Hx   . Depression Neg Hx   . Drug abuse Neg Hx   . Early death Neg Hx   . Hearing loss Neg Hx   . Kidney disease Neg Hx   . Learning disabilities Neg Hx   . Mental illness Neg Hx   . Mental retardation  Neg Hx   . Miscarriages / Stillbirths Neg Hx   . Stroke Neg Hx   . Vision loss Neg Hx   . Varicose Veins Neg Hx     Social History Social History  Substance Use Topics  . Smoking status: Former Smoker    Quit date: 03/27/2002  . Smokeless tobacco: Never Used  . Alcohol use No     Allergies   Penicillins; Sulfa antibiotics; Aspirin; and Coconut flavor   Review of Systems Review of Systems  Reason unable to perform ROS: See HPI as above.     Physical Exam Triage Vital Signs ED Triage Vitals  Enc Vitals Group     BP 10/03/16 1655 132/76     Pulse Rate 10/03/16 1655 78     Resp 10/03/16 1655 18     Temp 10/03/16 1655 98.6 F (37 C)     Temp Source 10/03/16 1655 Oral     SpO2 10/03/16 1655 100 %     Weight --      Height --      Head Circumference --      Peak Flow --      Pain Score 10/03/16 1726 1     Pain Loc --      Pain  Edu? --      Excl. in [place]? --    No data found.   Updated Vital Signs BP 132/76 (BP Location: Right Arm)   Pulse 78   Temp 98.6 F (37 C) (Oral)   Resp 18   LMP 09/20/2016   SpO2 100%       Physical Exam  Constitutional: She is oriented to person, place, and time. She appears well-developed and well-nourished. No distress.  HENT:  Head: Normocephalic and atraumatic.  Eyes: Pupils are equal, round, and reactive to light. Conjunctivae are normal.  Cardiovascular: Normal rate, regular rhythm and normal heart sounds.  Exam reveals no gallop and no friction rub.   No murmur heard. Pulmonary/Chest: Effort normal and breath sounds normal. She has no wheezes. She has no rales.  Abdominal: Soft. Bowel sounds are normal. She exhibits no mass. There is no tenderness. There is no rebound, no guarding and no CVA tenderness.  Genitourinary: There is no rash or lesion on the right labia. There is no rash or lesion on the left labia. Cervix exhibits discharge. Cervix exhibits no motion tenderness and no friability. Vaginal discharge found.  Neurological: She is alert and oriented to person, place, and time.  Skin: Skin is warm and dry.  Psychiatric: She has a normal mood and affect. Her behavior is normal. Judgment normal.     UC Treatments / Results  Labs (all labs ordered are listed, but only abnormal results are displayed) Labs Reviewed  CERVICOVAGINAL ANCILLARY ONLY    EKG  EKG Interpretation None       Radiology No results found.  Procedures Procedures (including critical care time)  Medications Ordered in UC Medications - No data to display   Initial Impression / Assessment and Plan / UC Course  I have reviewed the triage vital signs and the nursing notes.  Pertinent labs & imaging results that were available during my care of the patient were reviewed by me and considered in my medical decision making (see chart for details).    Patient was treated empirically for  Bacterial vaginosis and yeast. Start diflucan and flagyl. Start hydroxyzine for itchiness. Cytology sent, patient will be contacted with any positive results that require  additional treatment. Patient to refrain from sexual activity for the next 7 days. Follow up with GYN as needed if symptoms do not improve. Return precautions given.   Final Clinical Impressions(s) / UC Diagnoses   Final diagnoses:  Vaginal irritation  Vaginal discharge    New Prescriptions Discharge Medication List as of 10/03/2016  5:16 PM    START taking these medications   Details  fluconazole (DIFLUCAN) 150 MG tablet Take 1 tablet (150 mg total) by mouth daily. Take second dose 72 hours later if symptoms still persists., Starting Fri 10/03/2016, Normal    hydrOXYzine (ATARAX/VISTARIL) 25 MG tablet Take 1 tablet (25 mg total) by mouth 3 (three) times daily., Starting Fri 10/03/2016, Until Wed 10/08/2016, Normal    metroNIDAZOLE (FLAGYL) 500 MG tablet Take 1 tablet (500 mg total) by mouth 2 (two) times daily., Starting Fri 10/03/2016, Normal          Tasia Catchings, Amy V, PA-C 10/03/16 2107

## 2016-10-03 NOTE — ED Triage Notes (Signed)
Pt   Reports   Symptoms  Of  Vaginal  Itching ing  And  Irritation     She   deinies      Any   Pain       She   Denies   Any recent       Antibiotics        She  Ambulated to  Room  With  Steady  Fluid  Gait

## 2016-10-03 NOTE — Discharge Instructions (Signed)
You were treated empirically for bacterial vaginosis and yeast. Start Diflucan and Flagyl as directed. Hydroxyzine for itchiness. Cytology sent, you will be contacted with any positive results that requires further treatment. Refrain from sexual activity for the next 7 days. Follow up with GYN as needed for symptoms, I have attached some GYN for you, you can also call your insurance to find in network providers. Monitor for any worsening of symptoms, fever, abdominal pain, nausea, vomiting, to follow up for reevaluation.

## 2016-10-09 LAB — CERVICOVAGINAL ANCILLARY ONLY
Bacterial vaginitis: NEGATIVE
Candida vaginitis: POSITIVE — AB
Chlamydia: NEGATIVE
NEISSERIA GONORRHEA: NEGATIVE
TRICH (WINDOWPATH): NEGATIVE

## 2016-12-12 ENCOUNTER — Other Ambulatory Visit: Payer: Self-pay

## 2016-12-12 ENCOUNTER — Encounter (HOSPITAL_COMMUNITY): Payer: Self-pay | Admitting: Emergency Medicine

## 2016-12-12 ENCOUNTER — Emergency Department (HOSPITAL_COMMUNITY)
Admission: EM | Admit: 2016-12-12 | Discharge: 2016-12-12 | Disposition: A | Discharge status: Home / Self Care | Payer: Self-pay | Attending: Emergency Medicine | Admitting: Emergency Medicine

## 2016-12-12 DIAGNOSIS — J45909 Unspecified asthma, uncomplicated: Secondary | ICD-10-CM | POA: Insufficient documentation

## 2016-12-12 DIAGNOSIS — R05 Cough: Secondary | ICD-10-CM | POA: Insufficient documentation

## 2016-12-12 DIAGNOSIS — Z87891 Personal history of nicotine dependence: Secondary | ICD-10-CM | POA: Insufficient documentation

## 2016-12-12 DIAGNOSIS — R059 Cough, unspecified: Secondary | ICD-10-CM

## 2016-12-12 DIAGNOSIS — Z79899 Other long term (current) drug therapy: Secondary | ICD-10-CM | POA: Insufficient documentation

## 2016-12-12 MED ORDER — HYDROCODONE-HOMATROPINE 5-1.5 MG/5ML PO SYRP: 5.0000 mL | ORAL_SOLUTION | Freq: Four times a day (QID) | ORAL | 0 refills | Status: DC | PRN

## 2016-12-12 MED ORDER — BENZONATATE 100 MG PO CAPS: 100.0000 mg | ORAL_CAPSULE | Freq: Three times a day (TID) | ORAL | 0 refills | Status: DC

## 2016-12-12 MED ORDER — ALBUTEROL SULFATE HFA 108 (90 BASE) MCG/ACT IN AERS: 1.0000 | INHALATION_SPRAY | Freq: Four times a day (QID) | RESPIRATORY_TRACT | 0 refills | Status: DC | PRN

## 2016-12-12 NOTE — ED Notes (Signed)
Cough since Sunday and states coughs up some brown stuff on occasion. Pt is a smoker. Cough and has pain

## 2016-12-12 NOTE — Discharge Instructions (Signed)
Use Tessalon Perles as directed for cough.  Use Hycodan cough syrup as needed for cough.  Use albuterol inhaler as needed for difficulty breathing or wheezing.  Follow-up with your primary care doctor in the next 2-4 days for further evaluation.  Return to emergency department for any fevers, chest pain, difficulty breathing, vomiting or any other worsening or concerning symptoms.

## 2016-12-12 NOTE — ED Provider Notes (Signed)
[facility] Provider Note   CSN: [id number] Arrival date & time: 12/12/16  3382     History   Chief Complaint Chief Complaint  Patient presents with  . Cough    HPI Paula Cervantes is a 41 y.o. female presents with 1 week of progressively worsening cough and nasal congestion.  Patient reports that cough is productive with dark sputum and mucus. No hemoptysis. She reports that she has taken NyQuil and Mucinex with minimal improvement in symptoms.  She reports that she will sometimes get into a coughing fit and have some shortness of breath with the effect and some posttussive emesis.  She denies any difficulty breathing at rest.  Patient also reports some chest soreness secondary to persistent coughing.  She denies any chest pain at rest.  Patient states that she has a history of asthma not currently have an inhaler.  Patient reports that she has not had to use an inhaler for several years.  She reports that she is a former smoker and quit several years ago.  Patient denies any fevers, chills, nausea/vomiting, chest pain, abdominal pain.  The history is provided by the patient.    Past Medical History:  Diagnosis Date  . Abnormal Pap smear    no cervical cancer, just h/o cryotherapy  . Arthritis   . Asthma    no episodes/no inhaler needed  . Endometriosis   . Fibroids   . Gout   . Heartburn during pregnancy   . Lactose intolerance     Patient Active Problem List   Diagnosis Date Noted  . Migraine 08/23/2014  . S/P repeat low transverse C-section 02/09/2014    Past Surgical History:  Procedure Laterality Date  . CRYOTHERAPY    . DIAGNOSTIC LAPAROSCOPY    . KNEE SURGERY    . ovarian fibroids      OB History    Gravida Para Term Preterm AB Living   3 2 2   1 2    SAB TAB Ectopic Multiple Live Births   1     0 2       Home Medications    Prior to Admission medications   Medication Sig Start Date End Date Taking? Authorizing  Provider  albuterol (PROVENTIL HFA;VENTOLIN HFA) 108 (90 Base) MCG/ACT inhaler Inhale 1-2 puffs every 6 (six) hours as needed into the lungs for wheezing or shortness of breath. 12/12/16   Volanda Napoleon, PA-C  benzonatate (TESSALON) 100 MG capsule Take 1 capsule (100 mg total) every 8 (eight) hours by mouth. 12/12/16   Volanda Napoleon, PA-C  chlorpheniramine-HYDROcodone (TUSSIONEX PENNKINETIC ER) 10-8 MG/5ML SUER Take 5 mLs by mouth at bedtime as needed for cough. 06/12/16   Katy Apo, NP  fluconazole (DIFLUCAN) 150 MG tablet Take 1 tablet (150 mg total) by mouth daily. Take second dose 72 hours later if symptoms still persists. 10/03/16   Tasia Catchings, Amy V, PA-C  HYDROcodone-homatropine (HYCODAN) 5-1.5 MG/5ML syrup Take 5 mLs every 6 (six) hours as needed by mouth for cough. 12/12/16   Volanda Napoleon, PA-C  metroNIDAZOLE (FLAGYL) 500 MG tablet Take 1 tablet (500 mg total) by mouth 2 (two) times daily. 10/03/16   Ok Edwards, PA-C    Family History Family History  Problem Relation Age of Onset  . Diabetes Mother   . Hypertension Mother   . Hyperlipidemia Mother   . Heart disease Maternal Grandmother   . Alcohol abuse Neg Hx   . Arthritis  Neg Hx   . Asthma Neg Hx   . Birth defects Neg Hx   . Cancer Neg Hx   . COPD Neg Hx   . Depression Neg Hx   . Drug abuse Neg Hx   . Early death Neg Hx   . Hearing loss Neg Hx   . Kidney disease Neg Hx   . Learning disabilities Neg Hx   . Mental illness Neg Hx   . Mental retardation Neg Hx   . Miscarriages / Stillbirths Neg Hx   . Stroke Neg Hx   . Vision loss Neg Hx   . Varicose Veins Neg Hx     Social History Social History   Tobacco Use  . Smoking status: Former Smoker    Last attempt to quit: 03/27/2002    Years since quitting: 14.7  . Smokeless tobacco: Never Used  Substance Use Topics  . Alcohol use: No  . Drug use: No     Allergies   Penicillins; Sulfa antibiotics; Aspirin; and Coconut flavor   Review of Systems Review of  Systems  Constitutional: Negative for fever.  Respiratory: Positive for cough. Negative for shortness of breath.   Cardiovascular: Negative for chest pain.  Gastrointestinal: Negative for abdominal pain, nausea and vomiting.     Physical Exam Updated Vital Signs BP 129/64 (BP Location: Right Arm)   Pulse 91   Temp 98.7 F (37.1 C) (Oral)   Resp 18   SpO2 100%   Physical Exam  Constitutional: She is oriented to person, place, and time. She appears well-developed and well-nourished.  HENT:  Head: Normocephalic and atraumatic.  Nose: Mucosal edema present.  Mouth/Throat: Oropharynx is clear and moist and mucous membranes are normal.  History oropharynx clear without any erythema or edema or exudates.  Eyes: Conjunctivae, EOM and lids are normal. Pupils are equal, round, and reactive to light.  Neck: Full passive range of motion without pain.  Cardiovascular: Normal rate, regular rhythm, normal heart sounds and normal pulses. Exam reveals no gallop and no friction rub.  No murmur heard. Pulmonary/Chest: Effort normal and breath sounds normal. She has no wheezes.  No evidence of respiratory distress. Able to speak in full sentences without difficulty.  Lungs clear to auscultation bilaterally.  No evidence of wheezing  Abdominal: Soft. Normal appearance. There is no tenderness. There is no rigidity and no guarding.  Musculoskeletal: Normal range of motion.  Neurological: She is alert and oriented to person, place, and time.  Skin: Skin is warm and dry. Capillary refill takes less than 2 seconds.  Psychiatric: She has a normal mood and affect. Her speech is normal.  Nursing note and vitals reviewed.    ED Treatments / Results  Labs (all labs ordered are listed, but only abnormal results are displayed) Labs Reviewed - No data to display  EKG  EKG Interpretation None       Radiology No results found.  Procedures Procedures (including critical care time)  Medications  Ordered in ED Medications - No data to display   Initial Impression / Assessment and Plan / ED Course  I have reviewed the triage vital signs and the nursing notes.  Pertinent labs & imaging results that were available during my care of the patient were reviewed by me and considered in my medical decision making (see chart for details).     41 year old female who presents with 1 week of productive cough.  No fevers.  Had some episodes of posttussive emesis but no vomiting.  Patient reports that she will have a coughing fit and has some secondary shortness of breath and chest soreness.  No symptoms at rest. Patient is afebrile, non-toxic appearing, sitting comfortably on examination table. Vital signs reviewed and stable.  Consider URI versus bronchitis.  Do not suspect pneumonia given history/physical exam. History/Physical exam not concerning for ACS etiology or PE.  She with good air movement on exam and is able to speak in full sentences without any difficulty.  Do not suspect asthma exacerbation at this time.  Offered for chest x-ray evaluation for rule out pneumonia.  Patient declines at this time.  I feel this is reasonable given clear lung exam.  Will provide symptomatically for cough.  We will also plan to give patient albuterol inhaler for symptomatic relief.  Patient instructed to follow-up with her primary care doctor in the next 24-48 hours for further evaluation. Strict return precautions discussed. Patient expresses understanding and agreement to plan.    Final Clinical Impressions(s) / ED Diagnoses   Final diagnoses:  Cough    ED Discharge Orders        Ordered    albuterol (PROVENTIL HFA;VENTOLIN HFA) 108 (90 Base) MCG/ACT inhaler  Every 6 hours PRN     12/12/16 1133    HYDROcodone-homatropine (HYCODAN) 5-1.5 MG/5ML syrup  Every 6 hours PRN     12/12/16 1133    benzonatate (TESSALON) 100 MG capsule  Every 8 hours     12/12/16 1133       Volanda Napoleon, PA-C 12/12/16  1336    Sherwood Gambler, MD 12/13/16 2001

## 2016-12-12 NOTE — ED Triage Notes (Signed)
Pt reports 1 week of productive cough with brown mucus, pt reports headache from cough. denies any fever or chills.

## 2017-01-08 ENCOUNTER — Ambulatory Visit (HOSPITAL_COMMUNITY)
Admission: EM | Admit: 2017-01-08 | Discharge: 2017-01-08 | Disposition: A | Discharge status: Home / Self Care | Payer: Self-pay | Attending: Family Medicine | Admitting: Family Medicine

## 2017-01-08 ENCOUNTER — Encounter (HOSPITAL_COMMUNITY): Payer: Self-pay | Admitting: Emergency Medicine

## 2017-01-08 DIAGNOSIS — H9201 Otalgia, right ear: Secondary | ICD-10-CM

## 2017-01-08 MED ORDER — PREDNISONE 10 MG (21) PO TBPK: ORAL_TABLET | ORAL | 0 refills | Status: DC

## 2017-01-08 NOTE — ED Triage Notes (Signed)
Pt here for "fluid behind right ear" x 1 week that at times makes her fell off balance

## 2017-01-08 NOTE — Discharge Instructions (Signed)
If you are not feeling better within a few days I would recommend seeing and ear, nose, and throat specialist.

## 2017-01-14 NOTE — ED Provider Notes (Signed)
[facility]   [id number] 01/08/17 Arrival Time: 3382  ASSESSMENT & PLAN:  1. Right ear pain     Meds ordered this encounter  Medications  . predniSONE (STERAPRED UNI-PAK 21 TAB) 10 MG (21) TBPK tablet    Sig: Take as directed.    Dispense:  21 tablet    Refill:  0   Questions exact etiology. Question eustachian tube dysfunction. Recommend ENT evaluation if not improving over the next week. OTC symptom care as needed.  Reviewed expectations re: course of current medical issues. Questions answered. Outlined signs and symptoms indicating need for more acute intervention. Patient verbalized understanding. After Visit Summary given.   SUBJECTIVE:  Paula Cervantes is a 41 y.o. female who presents with complaint of possible "fluid in my right ear." Gradual onset of "pressure feeling" in R ear. No hearing changes. "Occasionally feel a little off balance." No specific aggravating or alleviating factors reported. Ambulatory. No recent illnesses. No OTC treatment. No h/o similar.  Social History   Tobacco Use  Smoking Status Former Smoker  . Last attempt to quit: 03/27/2002  . Years since quitting: 14.8  Smokeless Tobacco Never Used    ROS: As per HPI.   OBJECTIVE:  Vitals:   01/08/17 1908  BP: 117/90  Pulse: 99  Resp: 18  Temp: 98.7 F (37.1 C)  TempSrc: Oral  SpO2: 100%     General appearance: alert; no distress HEENT: slight nasal congestion; R TM with the slightest bulging; no erythema Neck: supple without LAD Lungs: clear to auscultation bilaterally Skin: warm and dry Psychological: alert and cooperative; normal mood and affect  Allergies  Allergen Reactions  . Penicillins Nausea And Vomiting and Other (See Comments)    Passed out Has patient had a PCN reaction causing immediate rash, facial/tongue/throat swelling, SOB or lightheadedness with hypotension: unknown Has patient had a PCN reaction causing severe rash involving mucus membranes or skin  necrosis: unknown Has patient had a PCN reaction that required hospitalization: unknown Has patient had a PCN reaction occurring within the last 10 years: no If all of the above answers are "NO", then may proceed with Cephalosporin use.   . Sulfa Antibiotics Other (See Comments)    Pt states that this medication causes blisters in mouth and throat.   . Aspirin Hives and Nausea And Vomiting  . Coconut Flavor Hives    Past Medical History:  Diagnosis Date  . Abnormal Pap smear    no cervical cancer, just h/o cryotherapy  . Arthritis   . Asthma    no episodes/no inhaler needed  . Endometriosis   . Fibroids   . Gout   . Heartburn during pregnancy   . Lactose intolerance     Family History  Problem Relation Age of Onset  . Diabetes Mother   . Hypertension Mother   . Hyperlipidemia Mother   . Heart disease Maternal Grandmother   . Alcohol abuse Neg Hx   . Arthritis Neg Hx   . Asthma Neg Hx   . Birth defects Neg Hx   . Cancer Neg Hx   . COPD Neg Hx   . Depression Neg Hx   . Drug abuse Neg Hx   . Early death Neg Hx   . Hearing loss Neg Hx   . Kidney disease Neg Hx   . Learning disabilities Neg Hx   . Mental illness Neg Hx   . Mental retardation Neg Hx   . Miscarriages / Stillbirths Neg Hx   .  Stroke Neg Hx   . Vision loss Neg Hx   . Varicose Veins Neg Hx     Social History   Socioeconomic History  . Marital status: Married    Spouse name: Not on file  . Number of children: Not on file  . Years of education: Not on file  . Highest education level: Not on file  Social Needs  . Financial resource strain: Not on file  . Food insecurity - worry: Not on file  . Food insecurity - inability: Not on file  . Transportation needs - medical: Not on file  . Transportation needs - non-medical: Not on file  Occupational History  . Not on file  Tobacco Use  . Smoking status: Former Smoker    Last attempt to quit: 03/27/2002    Years since quitting: 14.8  . Smokeless  tobacco: Never Used  Substance and Sexual Activity  . Alcohol use: No  . Drug use: No  . Sexual activity: Yes    Birth control/protection: None    Comment: last intercourse Mar24th 2015  Other Topics Concern  . Not on file  Social History Narrative  . Not on file           Vanessa Kick, MD 01/14/17 1012

## 2017-01-22 ENCOUNTER — Ambulatory Visit (HOSPITAL_COMMUNITY)
Admission: EM | Admit: 2017-01-22 | Discharge: 2017-01-22 | Disposition: A | Discharge status: Home / Self Care | Payer: Self-pay | Attending: Family Medicine | Admitting: Family Medicine

## 2017-01-22 ENCOUNTER — Encounter (HOSPITAL_COMMUNITY): Payer: Self-pay | Admitting: Emergency Medicine

## 2017-01-22 ENCOUNTER — Emergency Department (HOSPITAL_COMMUNITY): Admission: EM | Admit: 2017-01-22 | Discharge: 2017-01-22 | Discharge status: Against Medical Advice | Payer: Self-pay

## 2017-01-22 ENCOUNTER — Other Ambulatory Visit: Payer: Self-pay

## 2017-01-22 DIAGNOSIS — M25511 Pain in right shoulder: Secondary | ICD-10-CM

## 2017-01-22 MED ORDER — OXYCODONE-ACETAMINOPHEN 5-325 MG PO TABS: 2.0000 | ORAL_TABLET | Freq: Every evening | ORAL | 0 refills | Status: DC | PRN

## 2017-01-22 MED ORDER — PREDNISONE 20 MG PO TABS: ORAL_TABLET | ORAL | 0 refills | Status: DC

## 2017-01-22 NOTE — ED Provider Notes (Signed)
[facility]   [id number] 01/22/17 Arrival Time: 1920   SUBJECTIVE:  Paula Cervantes is a 41 y.o. female who presents to the urgent care with complaint of right shoulder pain that started when she woke up Sunday morning with no apparent injury.  Pt states the pain is very similar to when she dislocated her shoulder last year.  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  Past Medical History:  Diagnosis Date  . Abnormal Pap smear    no cervical cancer, just h/o cryotherapy  . Arthritis   . Asthma    no episodes/no inhaler needed  . Endometriosis   . Fibroids   . Gout   . Heartburn during pregnancy   . Lactose intolerance    Family History  Problem Relation Age of Onset  . Diabetes Mother   . Hypertension Mother   . Hyperlipidemia Mother   . Heart disease Maternal Grandmother   . Alcohol abuse Neg Hx   . Arthritis Neg Hx   . Asthma Neg Hx   . Birth defects Neg Hx   . Cancer Neg Hx   . COPD Neg Hx   . Depression Neg Hx   . Drug abuse Neg Hx   . Early death Neg Hx   . Hearing loss Neg Hx   . Kidney disease Neg Hx   . Learning disabilities Neg Hx   . Mental illness Neg Hx   . Mental retardation Neg Hx   . Miscarriages / Stillbirths Neg Hx   . Stroke Neg Hx   . Vision loss Neg Hx   . Varicose Veins Neg Hx    Social History   Socioeconomic History  . Marital status: Married    Spouse name: Not on file  . Number of children: Not on file  . Years of education: Not on file  . Highest education level: Not on file  Social Needs  . Financial resource strain: Not on file  . Food insecurity - worry: Not on file  . Food insecurity - inability: Not on file  . Transportation needs - medical: Not on file  . Transportation needs - non-medical: Not on file  Occupational History  . Not on file  Tobacco Use  . Smoking status: Former Smoker    Last attempt to quit: 03/27/2002    Years since quitting: 14.8  . Smokeless tobacco: Never Used  Substance and Sexual  Activity  . Alcohol use: No  . Drug use: No  . Sexual activity: Yes    Birth control/protection: None    Comment: last intercourse Mar24th 2015  Other Topics Concern  . Not on file  Social History Narrative  . Not on file   No outpatient medications have been marked as taking for the 01/22/17 encounter [facility] Encounter).   Allergies  Allergen Reactions  . Penicillins Nausea And Vomiting and Other (See Comments)    Passed out Has patient had a PCN reaction causing immediate rash, facial/tongue/throat swelling, SOB or lightheadedness with hypotension: unknown Has patient had a PCN reaction causing severe rash involving mucus membranes or skin necrosis: unknown Has patient had a PCN reaction that required hospitalization: unknown Has patient had a PCN reaction occurring within the last 10 years: no If all of the above answers are "NO", then may proceed with Cephalosporin use.   . Sulfa Antibiotics Other (See Comments)    Pt states that this medication causes blisters in mouth and throat.   . Aspirin Hives and Nausea And  Vomiting  . Coconut Flavor Hives      ROS: As per HPI, remainder of ROS negative.   OBJECTIVE:   Vitals:   01/22/17 1938  BP: 130/79  Pulse: (!) 107  Temp: 99.3 F (37.4 C)  TempSrc: Oral  SpO2: 100%     General appearance: alert; no distress Eyes: PERRL; EOMI; conjunctiva normal HENT: normocephalic; atraumatic; TMs normal, canal normal, external ears normal without trauma; nasal mucosa normal; oral mucosa normal Neck: supple Back: no CVA tenderness Extremities: no cyanosis or edema; symmetrical with no gross deformities;  Tender right shoulder anterior joint line Skin: warm and dry Neurologic: normal gait; grossly normal Psychological: alert and cooperative; normal mood and affect      Labs:  Results for orders placed or performed during the hospital encounter of 10/03/16  Cervicovaginal ancillary only  Result Value Ref Range    Bacterial vaginitis Negative for Bacterial Vaginitis Microorganisms    Candida vaginitis **POSITIVE for Candida species** (A)    Chlamydia Negative    Neisseria gonorrhea Negative    Trichomonas Negative     Labs Reviewed - No data to display  No results found.     ASSESSMENT & PLAN:  1. Acute pain of right shoulder     Meds ordered this encounter  Medications  . oxyCODONE-acetaminophen (PERCOCET/ROXICET) 5-325 MG tablet    Sig: Take 2 tablets by mouth at bedtime as needed for severe pain.    Dispense:  5 tablet    Refill:  0  . predniSONE (DELTASONE) 20 MG tablet    Sig: Two daily with food    Dispense:  10 tablet    Refill:  0    Reviewed expectations re: course of current medical issues. Questions answered. Outlined signs and symptoms indicating need for more acute intervention. Patient verbalized understanding. After Visit Summary given.    Procedures:      [name], MD 01/22/17 [name]

## 2017-01-22 NOTE — ED Triage Notes (Addendum)
Pt reports right shoulder pain that started when she woke up Sunday morning with no apparent injury.  Pt states the pain is very similar to when she dislocated her shoulder last year.

## 2017-01-29 ENCOUNTER — Other Ambulatory Visit: Payer: Self-pay

## 2017-01-29 ENCOUNTER — Encounter (HOSPITAL_COMMUNITY): Payer: Self-pay | Admitting: Emergency Medicine

## 2017-01-29 ENCOUNTER — Emergency Department (HOSPITAL_COMMUNITY): Payer: Self-pay

## 2017-01-29 DIAGNOSIS — M25511 Pain in right shoulder: Secondary | ICD-10-CM | POA: Insufficient documentation

## 2017-01-29 DIAGNOSIS — J45909 Unspecified asthma, uncomplicated: Secondary | ICD-10-CM | POA: Insufficient documentation

## 2017-01-29 DIAGNOSIS — M5412 Radiculopathy, cervical region: Secondary | ICD-10-CM | POA: Insufficient documentation

## 2017-01-29 DIAGNOSIS — Z87891 Personal history of nicotine dependence: Secondary | ICD-10-CM | POA: Insufficient documentation

## 2017-01-29 NOTE — ED Triage Notes (Signed)
Pt c/o right shoulder pain 10/10 prior hx of shoulder dislocation was seen on UC after given prednisone, pt states shoulder is not better, pt denies any SOB, nausea or vomiting, no resent injury.

## 2017-01-30 ENCOUNTER — Emergency Department (HOSPITAL_COMMUNITY)
Admission: EM | Admit: 2017-01-30 | Discharge: 2017-01-30 | Disposition: A | Discharge status: Home / Self Care | Payer: Self-pay | Attending: Emergency Medicine | Admitting: Emergency Medicine

## 2017-01-30 DIAGNOSIS — R52 Pain, unspecified: Secondary | ICD-10-CM

## 2017-01-30 DIAGNOSIS — M25511 Pain in right shoulder: Secondary | ICD-10-CM

## 2017-01-30 DIAGNOSIS — M5412 Radiculopathy, cervical region: Secondary | ICD-10-CM

## 2017-01-30 MED ORDER — OXYCODONE-ACETAMINOPHEN 5-325 MG PO TABS
1.0000 | ORAL_TABLET | Freq: Once | ORAL | Status: AC
  Administered 2017-01-30: 1 via ORAL
  Filled 2017-01-30: qty 1

## 2017-01-30 MED ORDER — CYCLOBENZAPRINE HCL 10 MG PO TABS: 10.0000 mg | ORAL_TABLET | Freq: Two times a day (BID) | ORAL | 0 refills | Status: DC | PRN

## 2017-01-30 MED ORDER — OXYCODONE-ACETAMINOPHEN 5-325 MG PO TABS: 1.0000 | ORAL_TABLET | ORAL | 0 refills | Status: DC | PRN

## 2017-01-30 NOTE — ED Provider Notes (Signed)
[facility] EM[facility]ENC[facility] DEPARTMENT Provider Note   CSN: [id number] Arrival date & time: 01/29/17  2311     History   Chief Complaint Chief Complaint  Patient presents with  . Shoulder Pain    HPI Paula Cervantes is a 41 y.o. female.  Patient presents with right shoulder pain across upper posterior shoulder with radiation down the arm to the 4th and 5th fingers of the right hand. She has not perceived any arm weakness or numbness. She has intermittent neck pain but does not complain of current neck pain. No swelling. No known injury to the shoulder.    The history is provided by the patient. No language interpreter was used.  Shoulder Pain   Pertinent negatives include no numbness.    Past Medical History:  Diagnosis Date  . Abnormal Pap smear    no cervical cancer, just h/o cryotherapy  . Arthritis   . Asthma    no episodes/no inhaler needed  . Endometriosis   . Fibroids   . Gout   . Heartburn during pregnancy   . Lactose intolerance     Patient Active Problem List   Diagnosis Date Noted  . Migraine 08/23/2014  . S/P repeat low transverse C-section 02/09/2014    Past Surgical History:  Procedure Laterality Date  . CESAREAN SECTION N/A 09/20/2012   Procedure: PRIMARY CESAREAN SECTION;  Surgeon: Margarette Asal, MD;  Location: [facility];  Service: Obstetrics;  Laterality: N/A;  Primary edc 8/25  . CESAREAN SECTION WITH BILATERAL TUBAL LIGATION Bilateral 02/09/2014   Procedure: CESAREAN SECTION WITH BILATERAL TUBAL LIGATION;  Surgeon: Logan Bores, MD;  Location: [facility];  Service: Obstetrics;  Laterality: Bilateral;  . CRYOTHERAPY    . DIAGNOSTIC LAPAROSCOPY    . KNEE SURGERY    . ovarian fibroids      OB History    Gravida Para Term Preterm AB Living   3 2 2   1 2    SAB TAB Ectopic Multiple Live Births   1     0 2       Home Medications    Prior to Admission medications   Medication Sig Start Date End Date Taking? Authorizing  Provider  oxyCODONE-acetaminophen (PERCOCET/ROXICET) 5-325 MG tablet Take 2 tablets by mouth at bedtime as needed for severe pain. 01/22/17   [name], MD  predniSONE (DELTASONE) 20 MG tablet Two daily with food 01/22/17   [name], MD    Family History Family History  Problem Relation Age of Onset  . Diabetes Mother   . Hypertension Mother   . Hyperlipidemia Mother   . Heart disease Maternal Grandmother   . Alcohol abuse Neg Hx   . Arthritis Neg Hx   . Asthma Neg Hx   . Birth defects Neg Hx   . Cancer Neg Hx   . COPD Neg Hx   . Depression Neg Hx   . Drug abuse Neg Hx   . Early death Neg Hx   . Hearing loss Neg Hx   . Kidney disease Neg Hx   . Learning disabilities Neg Hx   . Mental illness Neg Hx   . Mental retardation Neg Hx   . Miscarriages / Stillbirths Neg Hx   . Stroke Neg Hx   . Vision loss Neg Hx   . Varicose Veins Neg Hx     Social History Social History   Tobacco Use  . Smoking status: Former Smoker    Last attempt to  quit: 03/27/2002    Years since quitting: 14.8  . Smokeless tobacco: Never Used  Substance Use Topics  . Alcohol use: No  . Drug use: No     Allergies   Penicillins; Sulfa antibiotics; Aspirin; and Coconut flavor   Review of Systems Review of Systems  Constitutional: Negative for fever.  Musculoskeletal: Positive for neck pain.       See HPI.  Skin: Negative.  Negative for color change.  Neurological: Negative.  Negative for weakness and numbness.     Physical Exam Updated Vital Signs BP (!) 154/85 (BP Location: Left Arm)   Pulse 99   Temp 97.9 F (36.6 C) (Oral)   Resp 18   Ht 5\' 7"  (1.702 m)   Wt 86.2 kg (190 lb)   LMP 01/19/2017 (Exact Date)   SpO2 100%   BMI 29.76 kg/m   Physical Exam  Constitutional: She is oriented to person, place, and time. She appears well-developed and well-nourished.  Neck: Normal range of motion.  Pulmonary/Chest: Effort normal.  Musculoskeletal:  No swelling of right  neck or shoulder. FROM. There is slight weakness of grip on right when compared to the left. There is generalized tenderness of posterior shoulder. No bony deformity.  Neurological: She is alert and oriented to person, place, and time. She displays normal reflexes.  Skin: Skin is warm and dry. No erythema.     ED Treatments / Results  Labs (all labs ordered are listed, but only abnormal results are displayed) Labs Reviewed - No data to display  EKG  EKG Interpretation None       Radiology Dg Shoulder Right  Result Date: 01/29/2017 CLINICAL DATA:  Shoulder pain for 1 week EXAM: RIGHT SHOULDER - 2+ VIEW COMPARISON:  02/03/2016 FINDINGS: There is no evidence of fracture or dislocation. There is no evidence of arthropathy. Soft tissues are unremarkable. IMPRESSION: No acute osseous abnormality Electronically Signed   By: Donavan Foil M.D.   On: 01/29/2017 23:57    Procedures Procedures (including critical care time)  Medications Ordered in ED Medications - No data to display   Initial Impression / Assessment and Plan / ED Course  I have reviewed the triage vital signs and the nursing notes.  Pertinent labs & imaging results that were available during my care of the patient were reviewed by me and considered in my medical decision making (see chart for details).     Patient presents with left shoulder pain extending down right arm in radicular pattern. She has slight weakness on right grip but equal reflexes and preserved full ROM of the extremity.   Offered MRI of cervical spine to the patient but she prefers to follow up in the outpatient setting. Will provide pain relief and refer.   Final Clinical Impressions(s) / ED Diagnoses   Final diagnoses:  Pain   1. Right shoulder pain 2. Cervical radiculopathy  ED Discharge Orders    None       Charlann Lange, PA-C 03/54/65 6812    Delora Fuel, MD 75/17/00 [phone]

## 2017-03-21 ENCOUNTER — Encounter (HOSPITAL_COMMUNITY): Payer: Self-pay | Admitting: Emergency Medicine

## 2017-03-21 ENCOUNTER — Ambulatory Visit (HOSPITAL_COMMUNITY)
Admission: EM | Admit: 2017-03-21 | Discharge: 2017-03-21 | Disposition: A | Discharge status: Home / Self Care | Payer: BLUE CROSS/BLUE SHIELD | Attending: Family Medicine | Admitting: Family Medicine

## 2017-03-21 DIAGNOSIS — G43909 Migraine, unspecified, not intractable, without status migrainosus: Secondary | ICD-10-CM | POA: Diagnosis not present

## 2017-03-21 MED ORDER — SUMATRIPTAN SUCCINATE 100 MG PO TABS: ORAL_TABLET | ORAL | 0 refills | Status: DC

## 2017-03-21 MED ORDER — DEXAMETHASONE SODIUM PHOSPHATE 10 MG/ML IJ SOLN
10.0000 mg | Freq: Once | INTRAMUSCULAR | Status: AC
  Administered 2017-03-21: 10 mg via INTRAMUSCULAR

## 2017-03-21 MED ORDER — METOCLOPRAMIDE HCL 5 MG/ML IJ SOLN
5.0000 mg | Freq: Once | INTRAMUSCULAR | Status: AC
  Administered 2017-03-21: 5 mg via INTRAMUSCULAR

## 2017-03-21 MED ORDER — KETOROLAC TROMETHAMINE 60 MG/2ML IM SOLN
INTRAMUSCULAR | Status: AC
  Filled 2017-03-21: qty 2

## 2017-03-21 MED ORDER — SUMATRIPTAN SUCCINATE 6 MG/0.5ML ~~LOC~~ SOLN
6.0000 mg | Freq: Once | SUBCUTANEOUS | Status: AC
  Administered 2017-03-21: 6 mg via SUBCUTANEOUS

## 2017-03-21 MED ORDER — DEXAMETHASONE SODIUM PHOSPHATE 10 MG/ML IJ SOLN
INTRAMUSCULAR | Status: AC
  Filled 2017-03-21: qty 1

## 2017-03-21 MED ORDER — METOCLOPRAMIDE HCL 5 MG/ML IJ SOLN
INTRAMUSCULAR | Status: AC
  Filled 2017-03-21: qty 2

## 2017-03-21 MED ORDER — SUMATRIPTAN SUCCINATE 6 MG/0.5ML ~~LOC~~ SOLN
SUBCUTANEOUS | Status: AC
  Filled 2017-03-21: qty 0.5

## 2017-03-21 MED ORDER — KETOROLAC TROMETHAMINE 60 MG/2ML IM SOLN
60.0000 mg | Freq: Once | INTRAMUSCULAR | Status: AC
  Administered 2017-03-21: 60 mg via INTRAMUSCULAR

## 2017-03-21 NOTE — ED Triage Notes (Signed)
PT C/O: headache onset 2 days that is constant associated w/nausea and diarrhea   TAKING MEDS: acetaminophen   A&O x4... NAD... Ambulatory

## 2017-03-21 NOTE — Discharge Instructions (Signed)
You have received the following medications this evening for your migraine headache:  Meds ordered this encounter  Medications   ketorolac (TORADOL) injection 60 mg   metoCLOPramide (REGLAN) injection 5 mg   dexamethasone (DECADRON) injection 10 mg   SUMAtriptan (IMITREX) injection 6 mg   You may follow up here tomorrow if your headache has not resolved. If you feel that your symptoms are significantly worsening, please proceed to the Emergency Department for further evaluation.

## 2017-03-26 NOTE — ED Provider Notes (Signed)
[place]   [id number] 03/21/17 Arrival Time: 72  ASSESSMENT & PLAN:  1. Migraine without status migrainosus, not intractable, unspecified migraine type    Meds ordered this encounter  Medications  . ketorolac (TORADOL) injection 60 mg  . metoCLOPramide (REGLAN) injection 5 mg  . dexamethasone (DECADRON) injection 10 mg  . SUMAtriptan (IMITREX) injection 6 mg  . SUMAtriptan (IMITREX) 100 MG tablet    Sig: Take one table by mouth with onset of headache. May repeat in 2 hours if headache persists or recurs.    Dispense:  10 tablet    Refill:  0   Observe overnight. Will f/u here tomorrow morning if needed. Agrees to proceed to the ED if abrupt worsening tonight.  Reviewed expectations re: course of current medical issues. Questions answered. Outlined signs and symptoms indicating need for more acute intervention. Patient verbalized understanding. After Visit Summary given.   SUBJECTIVE:  Paula Cervantes is a 42 y.o. female who presents with complaint of a migraine headache for the past two days. Reports sporadic migraines that have not increased in frequency. Usually uses OTC analgesics and sleep to resolve headache. This HA typical of her migraines. Describes frontal throbbing with nausea (no emesis). Some photophobia. No aura reported. Tylenol today with mild help. Ambulatory without difficulty. Is not driving tonight. No extremity sensation changes or weakness.  ROS: As per HPI.   OBJECTIVE:  Vitals:   03/21/17 1736  BP: 131/63  Pulse: 88  Resp: 20  Temp: 98.4 F (36.9 C)  TempSrc: Oral  SpO2: 100%    General appearance: alert; no distress but appears fatigued Eyes: PERRLA; EOMI; conjunctiva normal HENT: normocephalic; atraumatic Neck: supple with FROM Lungs: clear to auscultation bilaterally Heart: regular rate and rhythm Extremities: no edema; symmetrical with no gross deformities Skin: warm and dry Neurologic: CN 2-12 grossly intact; normal  gait; normal symmetric reflexes; normal extremity strength and sensation throughout Psychological: alert and cooperative; normal mood and affect   Allergies  Allergen Reactions  . Penicillins Nausea And Vomiting and Other (See Comments)    Passed out Has patient had a PCN reaction causing immediate rash, facial/tongue/throat swelling, SOB or lightheadedness with hypotension: unknown Has patient had a PCN reaction causing severe rash involving mucus membranes or skin necrosis: unknown Has patient had a PCN reaction that required hospitalization: unknown Has patient had a PCN reaction occurring within the last 10 years: no If all of the above answers are "NO", then may proceed with Cephalosporin use.   . Sulfa Antibiotics Other (See Comments)    Pt states that this medication causes blisters in mouth and throat.   . Aspirin Hives and Nausea And Vomiting  . Coconut Flavor Hives    Past Medical History:  Diagnosis Date  . Abnormal Pap smear    no cervical cancer, just h/o cryotherapy  . Arthritis   . Asthma    no episodes/no inhaler needed  . Endometriosis   . Fibroids   . Gout   . Heartburn during pregnancy   . Lactose intolerance    Social History   Socioeconomic History  . Marital status: Married    Spouse name: Not on file  . Number of children: Not on file  . Years of education: Not on file  . Highest education level: Not on file  Social Needs  . Financial resource strain: Not on file  . Food insecurity - worry: Not on file  . Food insecurity - inability: Not on file  .  Transportation needs - medical: Not on file  . Transportation needs - non-medical: Not on file  Occupational History  . Not on file  Tobacco Use  . Smoking status: Former Smoker    Last attempt to quit: 03/27/2002    Years since quitting: 15.0  . Smokeless tobacco: Never Used  Substance and Sexual Activity  . Alcohol use: No  . Drug use: No  . Sexual activity: Yes    Birth control/protection:  None    Comment: last intercourse Mar24th 2015  Other Topics Concern  . Not on file  Social History Narrative  . Not on file   Family History  Problem Relation Age of Onset  . Diabetes Mother   . Hypertension Mother   . Hyperlipidemia Mother   . Heart disease Maternal Grandmother   . Alcohol abuse Neg Hx   . Arthritis Neg Hx   . Asthma Neg Hx   . Birth defects Neg Hx   . Cancer Neg Hx   . COPD Neg Hx   . Depression Neg Hx   . Drug abuse Neg Hx   . Early death Neg Hx   . Hearing loss Neg Hx   . Kidney disease Neg Hx   . Learning disabilities Neg Hx   . Mental illness Neg Hx   . Mental retardation Neg Hx   . Miscarriages / Stillbirths Neg Hx   . Stroke Neg Hx   . Vision loss Neg Hx   . Varicose Veins Neg Hx    Past Surgical History:  Procedure Laterality Date  . CESAREAN SECTION N/A 09/20/2012   Procedure: PRIMARY CESAREAN SECTION;  Surgeon: Margarette Asal, MD;  Location: [facility];  Service: Obstetrics;  Laterality: N/A;  Primary edc 8/25  . CESAREAN SECTION WITH BILATERAL TUBAL LIGATION Bilateral 02/09/2014   Procedure: CESAREAN SECTION WITH BILATERAL TUBAL LIGATION;  Surgeon: Logan Bores, MD;  Location: [facility];  Service: Obstetrics;  Laterality: Bilateral;  . CRYOTHERAPY    . DIAGNOSTIC LAPAROSCOPY    . KNEE SURGERY    . ovarian fibroids       Vanessa Kick, MD 03/26/17 [phone]

## 2017-03-27 DIAGNOSIS — Z6833 Body mass index (BMI) 33.0-33.9, adult: Secondary | ICD-10-CM | POA: Diagnosis not present

## 2017-03-27 DIAGNOSIS — Z23 Encounter for immunization: Secondary | ICD-10-CM | POA: Diagnosis not present

## 2017-03-27 DIAGNOSIS — Z713 Dietary counseling and surveillance: Secondary | ICD-10-CM | POA: Diagnosis not present

## 2017-03-27 DIAGNOSIS — Z136 Encounter for screening for cardiovascular disorders: Secondary | ICD-10-CM | POA: Diagnosis not present

## 2017-03-27 DIAGNOSIS — Z13220 Encounter for screening for lipoid disorders: Secondary | ICD-10-CM | POA: Diagnosis not present

## 2017-04-09 ENCOUNTER — Other Ambulatory Visit: Payer: Self-pay

## 2017-04-09 ENCOUNTER — Encounter: Payer: Self-pay | Admitting: Family Medicine

## 2017-04-09 ENCOUNTER — Ambulatory Visit: Payer: BLUE CROSS/BLUE SHIELD | Admitting: Family Medicine

## 2017-04-09 VITALS — BP 124/64 | HR 97 | Temp 98.7°F | Ht 67.0 in | Wt 213.2 lb

## 2017-04-09 DIAGNOSIS — J324 Chronic pansinusitis: Secondary | ICD-10-CM

## 2017-04-09 DIAGNOSIS — R519 Headache, unspecified: Secondary | ICD-10-CM

## 2017-04-09 DIAGNOSIS — R51 Headache: Secondary | ICD-10-CM | POA: Diagnosis not present

## 2017-04-09 MED ORDER — DOXYCYCLINE HYCLATE 100 MG PO TABS: 100.0000 mg | ORAL_TABLET | Freq: Two times a day (BID) | ORAL | 0 refills | Status: DC

## 2017-04-09 MED ORDER — TRIAMCINOLONE ACETONIDE 55 MCG/ACT NA AERO: 1.0000 | INHALATION_SPRAY | Freq: Two times a day (BID) | NASAL | 1 refills | Status: DC

## 2017-04-09 NOTE — Patient Instructions (Addendum)
  1. Start nasal salines washes    IF you received an x-ray today, you will receive an invoice from [facility]. Please contact [facility] at [phone] with questions or concerns regarding your invoice.   IF you received labwork today, you will receive an invoice from [facility]. Please contact [facility] at [phone] with questions or concerns regarding your invoice.   Our billing staff will not be able to assist you with questions regarding bills from these companies.  You will be contacted with the lab results as soon as they are available. The fastest way to get your results is to activate your My Chart account. Instructions are located on the last page of this paperwork. If you have not heard from [place] regarding the results in 2 weeks, please contact this office.

## 2017-04-09 NOTE — Progress Notes (Signed)
3/7/20195:36 PM  Paula Cervantes [birth year], 42 y.o. female [id number]  Chief Complaint  Patient presents with  . Migraine    HPI:   Patient is a 42 y.o. female with past medical history significant for migraine who presents today for recurring headache for past 3 months.  Headaches bilateral temples, feels as headache "rolls" inside her head Sometimes a/w photophobia, black spots and nausea States these headaches are different than her migraine which tend to start from the back and travel to the front behind an eye She states that these started after she had a cold in 12/2016 A/w nasal congestion, ear pressure, itchy ears. Denies sneezing Sudafed has not helped She reports recent normal eye exam by ophtho  Depression screen [facility] 2/9 04/09/2017 08/23/2014  Decreased Interest 0 0  Down, Depressed, Hopeless 0 0  PHQ - 2 Score 0 0    Allergies  Allergen Reactions  . Penicillins Nausea And Vomiting and Other (See Comments)    Passed out Has patient had a PCN reaction causing immediate rash, facial/tongue/throat swelling, SOB or lightheadedness with hypotension: unknown Has patient had a PCN reaction causing severe rash involving mucus membranes or skin necrosis: unknown Has patient had a PCN reaction that required hospitalization: unknown Has patient had a PCN reaction occurring within the last 10 years: no If all of the above answers are "NO", then may proceed with Cephalosporin use.   . Sulfa Antibiotics Other (See Comments)    Pt states that this medication causes blisters in mouth and throat.   . Aspirin Hives and Nausea And Vomiting  . Coconut Flavor Hives    Prior to Admission medications   Medication Sig Start Date End Date Taking? Authorizing Provider  cyclobenzaprine (FLEXERIL) 10 MG tablet Take 1 tablet (10 mg total) by mouth 2 (two) times daily as needed for muscle spasms. 01/30/17  Yes Charlann Lange, PA-C  SUMAtriptan (IMITREX) 100 MG tablet Take one table by  mouth with onset of headache. May repeat in 2 hours if headache persists or recurs. 03/21/17  Yes Hagler, Aaron Edelman, MD  oxyCODONE-acetaminophen (PERCOCET/ROXICET) 5-325 MG tablet Take 1 tablet by mouth every 4 (four) hours as needed for severe pain. Patient not taking: Reported on 04/09/2017 01/30/17   Charlann Lange, PA-C  predniSONE (DELTASONE) 20 MG tablet Two daily with food Patient not taking: Reported on 04/09/2017 01/22/17   [name], MD    Past Medical History:  Diagnosis Date  . Abnormal Pap smear    no cervical cancer, just h/o cryotherapy  . Arthritis   . Asthma    no episodes/no inhaler needed  . Endometriosis   . Fibroids   . Gout   . Heartburn during pregnancy   . Lactose intolerance     Past Surgical History:  Procedure Laterality Date  . CESAREAN SECTION N/A 09/20/2012   Procedure: PRIMARY CESAREAN SECTION;  Surgeon: Margarette Asal, MD;  Location: [facility];  Service: Obstetrics;  Laterality: N/A;  Primary edc 8/25  . CESAREAN SECTION WITH BILATERAL TUBAL LIGATION Bilateral 02/09/2014   Procedure: CESAREAN SECTION WITH BILATERAL TUBAL LIGATION;  Surgeon: Logan Bores, MD;  Location: [facility];  Service: Obstetrics;  Laterality: Bilateral;  . CRYOTHERAPY    . DIAGNOSTIC LAPAROSCOPY    . KNEE SURGERY    . ovarian fibroids      Social History   Tobacco Use  . Smoking status: Former Smoker    Last attempt to quit: 03/27/2002    Years since quitting: 15.0  .  Smokeless tobacco: Never Used  Substance Use Topics  . Alcohol use: No    Family History  Problem Relation Age of Onset  . Diabetes Mother   . Hypertension Mother   . Hyperlipidemia Mother   . Heart disease Maternal Grandmother   . Alcohol abuse Neg Hx   . Arthritis Neg Hx   . Asthma Neg Hx   . Birth defects Neg Hx   . Cancer Neg Hx   . COPD Neg Hx   . Depression Neg Hx   . Drug abuse Neg Hx   . Early death Neg Hx   . Hearing loss Neg Hx   . Kidney disease Neg Hx   . Learning disabilities  Neg Hx   . Mental illness Neg Hx   . Mental retardation Neg Hx   . Miscarriages / Stillbirths Neg Hx   . Stroke Neg Hx   . Vision loss Neg Hx   . Varicose Veins Neg Hx     ROS Per hpi Negative fever, chills Negative jaw claudication, focal weakness, changes in sensation or speech Negative hearing loss or tinnitus Positive for seasonal allergies Negative for joint pain or swelling  OBJECTIVE:  Blood pressure 124/64, pulse 97, temperature 98.7 F (37.1 C), temperature source Oral, height '5\' 7"'$  (1.702 m), weight 213 lb 3.2 oz (96.7 kg), SpO2 97 %.  Physical Exam  Constitutional: She is oriented to person, place, and time and well-developed, well-nourished, and in no distress.  HENT:  Head: Normocephalic and atraumatic.  Right Ear: Hearing, tympanic membrane, external ear and ear canal normal.  Left Ear: Hearing, tympanic membrane, external ear and ear canal normal.  Nose: Mucosal edema and rhinorrhea present. Right sinus exhibits maxillary sinus tenderness and frontal sinus tenderness. Left sinus exhibits maxillary sinus tenderness and frontal sinus tenderness.  Mouth/Throat: Oropharynx is clear and moist. No oropharyngeal exudate.  Eyes: EOM are normal. Pupils are equal, round, and reactive to light. No scleral icterus.  Neck: Neck supple.  Cardiovascular: Normal rate, regular rhythm and normal heart sounds. Exam reveals no gallop and no friction rub.  No murmur heard. Normal left TA pulse, unable to palpate Right  Pulmonary/Chest: Effort normal and breath sounds normal. She has no wheezes. She has no rales.  Musculoskeletal: She exhibits no edema.  Neurological: She is alert and oriented to person, place, and time. She has normal strength, normal reflexes and intact cranial nerves. She has a normal Cerebellar Exam and a normal Romberg Test. She shows no pronator drift. Gait normal.  Skin: Skin is warm and dry.    ASSESSMENT and PLAN  1. Chronic intractable headache,  unspecified headache type Given age and different type of headache will check esr thought TA unlikely, Normal neuro exam. Patient with sinusitis, which seems to be cause of these new recurring headaches. Will treat with doxy given allergies to [facility]. If headaches do not resolve consider imagining. RTC precautions reviewed.  - CBC - Sedimentation Rate  2. Chronic pansinusitis  Other orders - triamcinolone (NASACORT) 55 MCG/ACT AERO nasal inhaler; Place 1 spray into the nose 2 (two) times daily. - doxycycline (VIBRA-TABS) 100 MG tablet; Take 1 tablet (100 mg total) by mouth 2 (two) times daily.  Return in about 2 weeks (around 04/23/2017).    Rutherford Guys, MD Primary Care at [place], [place] [address] Ph.  [phone] Fax [phone]

## 2017-04-10 LAB — SEDIMENTATION RATE

## 2017-04-10 LAB — CBC

## 2017-04-13 ENCOUNTER — Telehealth: Payer: Self-pay | Admitting: Family Medicine

## 2017-04-13 NOTE — Telephone Encounter (Signed)
FYI. Should we have her return for repeat testing?

## 2017-04-13 NOTE — Telephone Encounter (Signed)
Phone call to patient. Unable to reach.  If patient calls back, please have her return to clinic at her convenience for repeat lab draw. Reason for lab draw repeat is insufficient sample for testing.   Provider, please sign pended order.

## 2017-04-13 NOTE — Telephone Encounter (Signed)
Copied from [place] [id number]. Topic: Quick Communication - See Telephone Encounter >> Apr 13, 2017  8:26 AM Synthia Innocent wrote: CRM for notification. See Telephone encounter for:  [facility] calling unable to run test, CBC and sed rate due to not enough blood 04/13/17.

## 2017-04-13 NOTE — Telephone Encounter (Signed)
Copied from [place] [id number]. Topic: Quick Communication - See Telephone Encounter >> Apr 13, 2017  8:26 AM Synthia Innocent wrote: CRM for notification. See Telephone encounter for:  [facility] calling unable to run test, CBC and sed rate due to not enough blood 04/13/17.

## 2017-04-13 NOTE — Telephone Encounter (Signed)
Yes please thanks.

## 2017-04-16 ENCOUNTER — Ambulatory Visit (INDEPENDENT_AMBULATORY_CARE_PROVIDER_SITE_OTHER): Payer: BLUE CROSS/BLUE SHIELD | Admitting: Family Medicine

## 2017-04-16 DIAGNOSIS — G43809 Other migraine, not intractable, without status migrainosus: Secondary | ICD-10-CM | POA: Diagnosis not present

## 2017-04-16 NOTE — Telephone Encounter (Signed)
Pt is aware need to repeat labs. Pt is on the way now.

## 2017-04-17 ENCOUNTER — Telehealth: Payer: Self-pay

## 2017-04-17 LAB — SEDIMENTATION RATE: Sed Rate: 15 mm/hr (ref 0–32)

## 2017-04-17 LAB — CBC WITH DIFFERENTIAL/PLATELET
Basophils Absolute: 0.1 10*3/uL (ref 0.0–0.2)
Basos: 1 %
EOS (ABSOLUTE): 0.5 10*3/uL — ABNORMAL HIGH (ref 0.0–0.4)
Eos: 8 %
Hematocrit: 24.5 % — ABNORMAL LOW (ref 34.0–46.6)
Hemoglobin: 7 g/dL — CL (ref 11.1–15.9)
Immature Grans (Abs): 0 10*3/uL (ref 0.0–0.1)
Immature Granulocytes: 0 %
Lymphocytes Absolute: 1.7 10*3/uL (ref 0.7–3.1)
Lymphs: 25 %
MCH: 18.3 pg — ABNORMAL LOW (ref 26.6–33.0)
MCHC: 28.6 g/dL — ABNORMAL LOW (ref 31.5–35.7)
MCV: 64 fL — ABNORMAL LOW (ref 79–97)
Monocytes Absolute: 0.8 10*3/uL (ref 0.1–0.9)
Monocytes: 12 %
Neutrophils Absolute: 3.7 10*3/uL (ref 1.4–7.0)
Neutrophils: 54 %
Platelets: 457 10*3/uL — ABNORMAL HIGH (ref 150–379)
RBC: 3.83 x10E6/uL (ref 3.77–5.28)
RDW: 18.5 % — ABNORMAL HIGH (ref 12.3–15.4)
WBC: 6.8 10*3/uL (ref 3.4–10.8)

## 2017-04-17 NOTE — Telephone Encounter (Signed)
CRITICAL LABS called - Hgb 7.0  Discussed with Paula Hummingbird PA  Called pt: she has heavy bleeding with clots with menstrual periods - states she is trying to get an ablation done.  Last period 3/9 - 3/14.  Pt was on her last day of period when labs drawn.   Confirmed pt has appt with Paula Cervantes 3/22.

## 2017-04-20 ENCOUNTER — Other Ambulatory Visit: Payer: Self-pay | Admitting: Family Medicine

## 2017-04-20 MED ORDER — FERROUS GLUCONATE 324 (38 FE) MG PO TABS
324.0000 mg | ORAL_TABLET | Freq: Two times a day (BID) | ORAL | 2 refills | Status: DC
Start: 2017-04-20 — End: 2017-07-22

## 2017-04-21 DIAGNOSIS — Z1389 Encounter for screening for other disorder: Secondary | ICD-10-CM | POA: Diagnosis not present

## 2017-04-21 DIAGNOSIS — Z01419 Encounter for gynecological examination (general) (routine) without abnormal findings: Secondary | ICD-10-CM | POA: Diagnosis not present

## 2017-04-21 DIAGNOSIS — Z124 Encounter for screening for malignant neoplasm of cervix: Secondary | ICD-10-CM | POA: Diagnosis not present

## 2017-04-21 DIAGNOSIS — Z6833 Body mass index (BMI) 33.0-33.9, adult: Secondary | ICD-10-CM | POA: Diagnosis not present

## 2017-04-21 DIAGNOSIS — Z1231 Encounter for screening mammogram for malignant neoplasm of breast: Secondary | ICD-10-CM | POA: Diagnosis not present

## 2017-04-23 ENCOUNTER — Ambulatory Visit: Payer: BLUE CROSS/BLUE SHIELD | Admitting: Family Medicine

## 2017-04-29 ENCOUNTER — Telehealth: Payer: Self-pay | Admitting: Family Medicine

## 2017-04-29 NOTE — Telephone Encounter (Signed)
Called pt to see if I could get her rescheduled for her missed appt on 04/23/17. Left message per DPR. If pt calls back, please schedule her with Tenna Delaine at her convenience. Thanks!

## 2017-05-01 NOTE — Progress Notes (Signed)
Lab visit only.  Pt did not see provider at this visit.

## 2017-05-07 ENCOUNTER — Other Ambulatory Visit: Payer: Self-pay

## 2017-05-07 ENCOUNTER — Ambulatory Visit: Payer: BLUE CROSS/BLUE SHIELD | Admitting: Family Medicine

## 2017-05-07 ENCOUNTER — Encounter: Payer: Self-pay | Admitting: Family Medicine

## 2017-05-07 VITALS — BP 116/78 | HR 89 | Temp 98.7°F | Ht 67.0 in | Wt 210.0 lb

## 2017-05-07 DIAGNOSIS — G43809 Other migraine, not intractable, without status migrainosus: Secondary | ICD-10-CM | POA: Diagnosis not present

## 2017-05-07 DIAGNOSIS — D500 Iron deficiency anemia secondary to blood loss (chronic): Secondary | ICD-10-CM | POA: Diagnosis not present

## 2017-05-07 DIAGNOSIS — H6983 Other specified disorders of Eustachian tube, bilateral: Secondary | ICD-10-CM | POA: Diagnosis not present

## 2017-05-07 DIAGNOSIS — F17200 Nicotine dependence, unspecified, uncomplicated: Secondary | ICD-10-CM

## 2017-05-07 MED ORDER — IPRATROPIUM BROMIDE 0.03 % NA SOLN: 2.0000 | Freq: Two times a day (BID) | NASAL | 0 refills | Status: DC

## 2017-05-07 MED ORDER — BUTALBITAL-APAP-CAFFEINE 50-325-40 MG PO TABS: 1.0000 | ORAL_TABLET | Freq: Four times a day (QID) | ORAL | 0 refills | Status: DC | PRN

## 2017-05-07 NOTE — Patient Instructions (Addendum)
1.please start nasal saline washes    IF you received an x-ray today, you will receive an invoice from [facility]. Please contact [facility] at [phone] with questions or concerns regarding your invoice.   IF you received labwork today, you will receive an invoice from [facility]. Please contact [facility] at [phone] with questions or concerns regarding your invoice.   Our billing staff will not be able to assist you with questions regarding bills from these companies.  You will be contacted with the lab results as soon as they are available. The fastest way to get your results is to activate your My Chart account. Instructions are located on the last page of this paperwork. If you have not heard from [place] regarding the results in 2 weeks, please contact this office.

## 2017-05-07 NOTE — Progress Notes (Signed)
4/4/20194:28 PM  Paula Cervantes December 08, 1975, 42 y.o. female [id number]  Chief Complaint  Patient presents with  . Follow-up    Headaches are doing much better. Want information on Butal and help on stopping cigarettes    HPI:   Patient is a 42 y.o. female with past medical history significant for migraines who presents today for followup  Last visit treated with abx for sinusitis, as it was triggering migraines. Much better now however still having plugged ears, some itchiness, but no nasal congestion, taking nasacort max dose. Sudafed made  Overall not on preventive treatment for migraines at this point as she gets maybe 1-2 a month at baseline imitrex never worked for her, did not resolve her migraines at all, fioricet does abort them  Migraines with aura, right sided, flashing lights, nausea, photophobia.  Has seen ENT in the past as she has had eustachian tube dysfunction before Smokes 1/2 ppd, interested/ready to quit. Was able to quit once for about 10 years.  Not tolerating iron very much, having GI side effects. Under gyn care for menorrhagia. Plans are ablations vs hysterectomy.  Depression screen [facility] 2/9 05/07/2017 04/09/2017 08/23/2014  Decreased Interest 0 0 0  Down, Depressed, Hopeless 0 0 0  PHQ - 2 Score 0 0 0    Allergies  Allergen Reactions  . Penicillins Nausea And Vomiting and Other (See Comments)    Passed out Has patient had a PCN reaction causing immediate rash, facial/tongue/throat swelling, SOB or lightheadedness with hypotension: unknown Has patient had a PCN reaction causing severe rash involving mucus membranes or skin necrosis: unknown Has patient had a PCN reaction that required hospitalization: unknown Has patient had a PCN reaction occurring within the last 10 years: no If all of the above answers are "NO", then may proceed with Cephalosporin use.   . Sulfa Antibiotics Other (See Comments)    Pt states that this medication causes blisters in mouth  and throat.   . Aspirin Hives and Nausea And Vomiting  . Coconut Flavor Hives    Prior to Admission medications   Medication Sig Start Date End Date Taking? Authorizing Provider  ferrous gluconate (FERGON) 324 MG tablet Take 1 tablet (324 mg total) by mouth 2 (two) times daily with a meal. 04/20/17  Yes Rutherford Guys, MD  triamcinolone (NASACORT) 55 MCG/ACT AERO nasal inhaler Place 1 spray into the nose 2 (two) times daily. 04/09/17  Yes Rutherford Guys, MD  SUMAtriptan (IMITREX) 100 MG tablet Take one table by mouth with onset of headache. May repeat in 2 hours if headache persists or recurs. Patient not taking: Reported on 05/07/2017 03/21/17   Vanessa Kick, MD    Past Medical History:  Diagnosis Date  . Abnormal Pap smear    no cervical cancer, just h/o cryotherapy  . Arthritis   . Asthma    no episodes/no inhaler needed  . Endometriosis   . Fibroids   . Gout   . Heartburn during pregnancy   . Lactose intolerance     Past Surgical History:  Procedure Laterality Date  . CESAREAN SECTION N/A 09/20/2012   Procedure: PRIMARY CESAREAN SECTION;  Surgeon: Margarette Asal, MD;  Location: [facility];  Service: Obstetrics;  Laterality: N/A;  Primary edc 8/25  . CESAREAN SECTION WITH BILATERAL TUBAL LIGATION Bilateral 02/09/2014   Procedure: CESAREAN SECTION WITH BILATERAL TUBAL LIGATION;  Surgeon: Logan Bores, MD;  Location: [facility];  Service: Obstetrics;  Laterality: Bilateral;  . CRYOTHERAPY    .  DIAGNOSTIC LAPAROSCOPY    . KNEE SURGERY    . ovarian fibroids      Social History   Tobacco Use  . Smoking status: Former Smoker    Last attempt to quit: 03/27/2002    Years since quitting: 15.1  . Smokeless tobacco: Never Used  Substance Use Topics  . Alcohol use: No    Family History  Problem Relation Age of Onset  . Diabetes Mother   . Hypertension Mother   . Hyperlipidemia Mother   . Heart disease Maternal Grandmother   . Alcohol abuse Neg Hx   . Arthritis Neg Hx   .  Asthma Neg Hx   . Birth defects Neg Hx   . Cancer Neg Hx   . COPD Neg Hx   . Depression Neg Hx   . Drug abuse Neg Hx   . Early death Neg Hx   . Hearing loss Neg Hx   . Kidney disease Neg Hx   . Learning disabilities Neg Hx   . Mental illness Neg Hx   . Mental retardation Neg Hx   . Miscarriages / Stillbirths Neg Hx   . Stroke Neg Hx   . Vision loss Neg Hx   . Varicose Veins Neg Hx     Review of Systems  Constitutional: Negative for chills and fever.  Respiratory: Negative for cough and shortness of breath.   Cardiovascular: Negative for chest pain, palpitations and leg swelling.  Gastrointestinal: Negative for abdominal pain, nausea and vomiting.     OBJECTIVE:  Blood pressure 116/78, pulse 89, temperature 98.7 F (37.1 C), temperature source Oral, height 5\' 7"  (1.702 m), weight 210 lb (95.3 kg), last menstrual period 05/07/2017, SpO2 98 %.  Physical Exam  Constitutional: She is oriented to person, place, and time and well-developed, well-nourished, and in no distress.  HENT:  Head: Normocephalic and atraumatic.  Right Ear: Hearing, tympanic membrane, external ear and ear canal normal.  Left Ear: Hearing, tympanic membrane, external ear and ear canal normal.  Mouth/Throat: Oropharynx is clear and moist.  Eyes: Pupils are equal, round, and reactive to light. EOM are normal.  Neck: Neck supple.  Cardiovascular: Normal rate, regular rhythm and normal heart sounds. Exam reveals no gallop and no friction rub.  No murmur heard. Pulmonary/Chest: Effort normal and breath sounds normal. She has no wheezes. She has no rales.  Lymphadenopathy:    She has no cervical adenopathy.  Neurological: She is alert and oriented to person, place, and time. Gait normal.  Skin: Skin is warm and dry.    ASSESSMENT and PLAN  1. Other migraine without status migrainosus, not intractable Per quick review of her insurance, no other triptan covered. Therefore will cont with Fioricet. R/se/b  reviewed. Consider referral to headache clinic.  2. Dysfunction of both eustachian tubes Adding Atrovent. Encouraged phenylephrine, nasal saline washes.   3. Tobacco use disorder Discussed treatment options. Will do trial of chantix. Discussed new med r/se/b.  4. Iron deficiency anemia due to chronic blood loss Discussed trying prenatal vitamin, instead of oral iron supplement. Discussed dietary sources. Continue with gyn for definitive treatment of menorrhagia.   Other orders - ipratropium (ATROVENT) 0.03 % nasal spray; Place 2 sprays into both nostrils 2 (two) times daily. - butalbital-acetaminophen-caffeine (FIORICET, ESGIC) 50-325-40 MG tablet; Take 1 tablet by mouth every 6 (six) hours as needed for headache. - varenicline (CHANTIX PAK) 0.5 MG X 11 & 1 MG X 42 tablet; Take one 0.5 mg tab PO daily for  3 days, then increase to one 0.5 mg tab twice daily for 4 days, then increase to one 1 mg tab BID.  Return in about 1 month (around 06/04/2017).    Rutherford Guys, MD Primary Care at [place], [place] [address] Ph.  [phone] Fax [phone]

## 2017-05-08 DIAGNOSIS — N938 Other specified abnormal uterine and vaginal bleeding: Secondary | ICD-10-CM | POA: Diagnosis not present

## 2017-05-08 DIAGNOSIS — N920 Excessive and frequent menstruation with regular cycle: Secondary | ICD-10-CM | POA: Diagnosis not present

## 2017-05-08 MED ORDER — VARENICLINE TARTRATE 0.5 MG X 11 & 1 MG X 42 PO MISC: ORAL | 0 refills | Status: DC

## 2017-06-05 ENCOUNTER — Ambulatory Visit: Payer: BLUE CROSS/BLUE SHIELD | Admitting: Family Medicine

## 2017-06-13 ENCOUNTER — Encounter (HOSPITAL_COMMUNITY): Payer: Self-pay | Admitting: *Deleted

## 2017-06-13 ENCOUNTER — Ambulatory Visit (HOSPITAL_COMMUNITY)
Admission: EM | Admit: 2017-06-13 | Discharge: 2017-06-13 | Disposition: A | Discharge status: Home / Self Care | Payer: BLUE CROSS/BLUE SHIELD | Attending: Family Medicine | Admitting: Family Medicine

## 2017-06-13 ENCOUNTER — Other Ambulatory Visit: Payer: Self-pay

## 2017-06-13 DIAGNOSIS — R059 Cough, unspecified: Secondary | ICD-10-CM

## 2017-06-13 DIAGNOSIS — H66005 Acute suppurative otitis media without spontaneous rupture of ear drum, recurrent, left ear: Secondary | ICD-10-CM | POA: Diagnosis not present

## 2017-06-13 DIAGNOSIS — R05 Cough: Secondary | ICD-10-CM | POA: Diagnosis not present

## 2017-06-13 MED ORDER — BENZONATATE 100 MG PO CAPS: 100.0000 mg | ORAL_CAPSULE | Freq: Three times a day (TID) | ORAL | 0 refills | Status: DC | PRN

## 2017-06-13 MED ORDER — CIPROFLOXACIN HCL 500 MG PO TABS: 500.0000 mg | ORAL_TABLET | Freq: Two times a day (BID) | ORAL | 0 refills | Status: DC

## 2017-06-13 MED ORDER — PROMETHAZINE HCL 6.25 MG/5ML PO SYRP: 12.5000 mg | ORAL_SOLUTION | Freq: Four times a day (QID) | ORAL | 0 refills | Status: DC | PRN

## 2017-06-13 NOTE — ED Triage Notes (Signed)
C/O cough, feeling feverish, and left ear pain since yesterday.

## 2017-06-13 NOTE — ED Provider Notes (Signed)
[place]   [id number] 06/13/17 Arrival Time: [facility]   SUBJECTIVE:  Paula Cervantes is a 42 y.o. female who presents to the urgent care with complaint of loss of hearing and discomfort left ear.  Patient has had recurrent left otitis media treated with doxycycline. The symptoms return as soon as she finishes a course of antibiotics.  Patient also notices a dry cough and a small nodule which is tender below the lobe of the left ear.     Past Medical History:  Diagnosis Date  . Abnormal Pap smear    no cervical cancer, just h/o cryotherapy  . Arthritis   . Asthma    no episodes/no inhaler needed  . Endometriosis   . Fibroids   . Gout   . Heartburn during pregnancy   . Lactose intolerance    Family History  Problem Relation Age of Onset  . Diabetes Mother   . Hypertension Mother   . Hyperlipidemia Mother   . Heart disease Maternal Grandmother   . Alcohol abuse Neg Hx   . Arthritis Neg Hx   . Asthma Neg Hx   . Birth defects Neg Hx   . Cancer Neg Hx   . COPD Neg Hx   . Depression Neg Hx   . Drug abuse Neg Hx   . Early death Neg Hx   . Hearing loss Neg Hx   . Kidney disease Neg Hx   . Learning disabilities Neg Hx   . Mental illness Neg Hx   . Mental retardation Neg Hx   . Miscarriages / Stillbirths Neg Hx   . Stroke Neg Hx   . Vision loss Neg Hx   . Varicose Veins Neg Hx    Social History   Socioeconomic History  . Marital status: Married    Spouse name: Not on file  . Number of children: Not on file  . Years of education: Not on file  . Highest education level: Not on file  Occupational History  . Not on file  Social Needs  . Financial resource strain: Not on file  . Food insecurity:    Worry: Not on file    Inability: Not on file  . Transportation needs:    Medical: Not on file    Non-medical: Not on file  Tobacco Use  . Smoking status: Former Smoker    Last attempt to quit: 03/27/2002    Years since quitting: 15.2  . Smokeless tobacco:  Never Used  Substance and Sexual Activity  . Alcohol use: No  . Drug use: No  . Sexual activity: Yes    Birth control/protection: None  Lifestyle  . Physical activity:    Days per week: Not on file    Minutes per session: Not on file  . Stress: Not on file  Relationships  . Social connections:    Talks on phone: Not on file    Gets together: Not on file    Attends religious service: Not on file    Active member of club or organization: Not on file    Attends meetings of clubs or organizations: Not on file    Relationship status: Not on file  . Intimate partner violence:    Fear of current or ex partner: Not on file    Emotionally abused: Not on file    Physically abused: Not on file    Forced sexual activity: Not on file  Other Topics Concern  . Not on file  Social History  Narrative  . Not on file   Current Meds  Medication Sig  . varenicline (CHANTIX PAK) 0.5 MG X 11 & 1 MG X 42 tablet Take one 0.5 mg tab PO daily for 3 days, then increase to one 0.5 mg tab twice daily for 4 days, then increase to one 1 mg tab BID.   Allergies  Allergen Reactions  . Penicillins Nausea And Vomiting and Other (See Comments)    Passed out Has patient had a PCN reaction causing immediate rash, facial/tongue/throat swelling, SOB or lightheadedness with hypotension: unknown Has patient had a PCN reaction causing severe rash involving mucus membranes or skin necrosis: unknown Has patient had a PCN reaction that required hospitalization: unknown Has patient had a PCN reaction occurring within the last 10 years: no If all of the above answers are "NO", then may proceed with Cephalosporin use.   . Sulfa Antibiotics Other (See Comments)    Pt states that this medication causes blisters in mouth and throat.   . Aspirin Hives and Nausea And Vomiting  . Coconut Flavor Hives      ROS: As per HPI, remainder of ROS negative.   OBJECTIVE:   Vitals:   06/13/17 1604  BP: 134/79  Pulse: 93    Resp: 16  Temp: 97.9 F (36.6 C)  TempSrc: Oral  SpO2: 100%     General appearance: alert; no distress Eyes: PERRL; EOMI; conjunctiva normal HENT: normocephalic; atraumatic; TMs show pus behind the left eardrum with redness, canal normal, external ears normal without trauma although there is a deep 2 mm tender nodule below the left earlobe; nasal mucosa normal; oral mucosa normal Neck: supple Lungs: clear to auscultation bilaterally Heart: regular rate and rhythm; 2/6 systolic systolic ejection type murmur Back: no CVA tenderness Extremities: no cyanosis or edema; symmetrical with no gross deformities Skin: warm and dry Neurologic: normal gait; grossly normal Psychological: alert and cooperative; normal mood and affect      Labs:  Results for orders placed or performed in visit on 04/16/17  CBC with Differential/Platelet  Result Value Ref Range   WBC 6.8 3.4 - 10.8 x10E3/uL   RBC 3.83 3.77 - 5.28 x10E6/uL   Hemoglobin 7.0 (LL) 11.1 - 15.9 g/dL   Hematocrit 24.5 (L) 34.0 - 46.6 %   MCV 64 (L) 79 - 97 fL   MCH 18.3 (L) 26.6 - 33.0 pg   MCHC 28.6 (L) 31.5 - 35.7 g/dL   RDW 18.5 (H) 12.3 - 15.4 %   Platelets 457 (H) 150 - 379 x10E3/uL   Neutrophils 54 Not Estab. %   Lymphs 25 Not Estab. %   Monocytes 12 Not Estab. %   Eos 8 Not Estab. %   Basos 1 Not Estab. %   Neutrophils Absolute 3.7 1.4 - 7.0 x10E3/uL   Lymphocytes Absolute 1.7 0.7 - 3.1 x10E3/uL   Monocytes Absolute 0.8 0.1 - 0.9 x10E3/uL   EOS (ABSOLUTE) 0.5 (H) 0.0 - 0.4 x10E3/uL   Basophils Absolute 0.1 0.0 - 0.2 x10E3/uL   Immature Granulocytes 0 Not Estab. %   Immature Grans (Abs) 0.0 0.0 - 0.1 x10E3/uL  Sedimentation Rate  Result Value Ref Range   Sed Rate 15 0 - 32 mm/hr    Labs Reviewed - No data to display  No results found.     ASSESSMENT & PLAN:  1. Recurrent acute suppurative otitis media without spontaneous rupture of left tympanic membrane   2. Cough     Meds ordered this encounter  Medications  . ciprofloxacin (CIPRO) 500 MG tablet    Sig: Take 1 tablet (500 mg total) by mouth 2 (two) times daily.    Dispense:  14 tablet    Refill:  0    Reviewed expectations re: course of current medical issues. Questions answered. Outlined signs and symptoms indicating need for more acute intervention. Patient verbalized understanding. After Visit Summary given.    Procedures:      [name], MD 06/13/17 [phone]

## 2017-06-25 ENCOUNTER — Other Ambulatory Visit: Payer: Self-pay | Admitting: Family Medicine

## 2017-06-25 NOTE — Telephone Encounter (Signed)
Patient called, left VM to return call back to the office to let us know if the Chantix Pak refill is needed or not.

## 2017-06-26 NOTE — Telephone Encounter (Signed)
Spoke to patient- patient states she has stopped smoking- she quit 21 days ago- praised patient for her hard work. She states she actually did not follow the instructions exactly- she only took one pill daily. She is now taking 1 mg daily and it is maintaining her. I told her I would let Dr Pamella Pert know and see how she wants to handle her medication- she will need continuation Rx. Told patient to follow instruction given by provider.

## 2017-06-26 NOTE — Telephone Encounter (Signed)
Attempted to call patient regarding Chantix use, no answer.

## 2017-06-30 ENCOUNTER — Other Ambulatory Visit: Payer: Self-pay | Admitting: Family Medicine

## 2017-07-06 DIAGNOSIS — N938 Other specified abnormal uterine and vaginal bleeding: Secondary | ICD-10-CM | POA: Diagnosis not present

## 2017-07-06 DIAGNOSIS — N840 Polyp of corpus uteri: Secondary | ICD-10-CM | POA: Diagnosis not present

## 2017-07-09 MED ORDER — VARENICLINE TARTRATE 1 MG PO TABS: 1.0000 mg | ORAL_TABLET | Freq: Two times a day (BID) | ORAL | 1 refills | Status: DC

## 2017-07-16 DIAGNOSIS — N898 Other specified noninflammatory disorders of vagina: Secondary | ICD-10-CM | POA: Diagnosis not present

## 2017-07-16 DIAGNOSIS — N8501 Benign endometrial hyperplasia: Secondary | ICD-10-CM | POA: Diagnosis not present

## 2017-07-16 DIAGNOSIS — R5383 Other fatigue: Secondary | ICD-10-CM | POA: Diagnosis not present

## 2017-07-22 ENCOUNTER — Encounter: Payer: Self-pay | Admitting: Family Medicine

## 2017-07-22 ENCOUNTER — Other Ambulatory Visit: Payer: Self-pay

## 2017-07-22 ENCOUNTER — Ambulatory Visit: Payer: BLUE CROSS/BLUE SHIELD | Admitting: Family Medicine

## 2017-07-22 VITALS — BP 122/60 | HR 101 | Temp 98.6°F | Resp 18 | Ht 67.56 in | Wt 221.8 lb

## 2017-07-22 DIAGNOSIS — E039 Hypothyroidism, unspecified: Secondary | ICD-10-CM

## 2017-07-22 DIAGNOSIS — R0683 Snoring: Secondary | ICD-10-CM

## 2017-07-22 DIAGNOSIS — D500 Iron deficiency anemia secondary to blood loss (chronic): Secondary | ICD-10-CM

## 2017-07-22 DIAGNOSIS — R5383 Other fatigue: Secondary | ICD-10-CM | POA: Diagnosis not present

## 2017-07-22 DIAGNOSIS — E559 Vitamin D deficiency, unspecified: Secondary | ICD-10-CM

## 2017-07-22 DIAGNOSIS — E038 Other specified hypothyroidism: Secondary | ICD-10-CM

## 2017-07-22 NOTE — Patient Instructions (Addendum)
  I have ordered either a referral or a diagnostic image. Please be mindful that it usually takes about 2 weeks to be called for an appointment. However if you have not be called for your appointment, please call us at [phone] and ask to speak with a referral clerk to further inquire about either your referral of diagnostic image.   IF you received an x-ray today, you will receive an invoice from [facility]. Please contact [facility] at [phone] with questions or concerns regarding your invoice.   IF you received labwork today, you will receive an invoice from [facility]. Please contact [facility] at [phone] with questions or concerns regarding your invoice.   Our billing staff will not be able to assist you with questions regarding bills from these companies.  You will be contacted with the lab results as soon as they are available. The fastest way to get your results is to activate your My Chart account. Instructions are located on the last page of this paperwork. If you have not heard from [place] regarding the results in 2 weeks, please contact this office.

## 2017-07-22 NOTE — Progress Notes (Addendum)
6/19/201910:42 AM  Paula Cervantes 1975-12-12, 42 y.o. female [id number]  Chief Complaint  Patient presents with  . Fatigue    F/up post OBGYN procedure/iron defieciency/b12 deifiency    HPI:   Patient is a 42 y.o. female with past medical history significant for DUB, iron def anemia and severe fatigue who presents today for followup  Endometrial ablation with dc early June, DUB completely resolved Gyn changed her to a different iron formulation, gave her samples, patient believes it is [place] She also started her on low dose levo for subclinical hypothyroidism and referred to endo She also started her on vitamin d  Patent however continues with severe fatigue, wakes up tired, falls asleep easily anywhere Sometimes needs to take a nap at work before driving home for fear of not falling asleep She snores "terrible"  Recurrent headaches  She denies any GI issues  Labs 07/16/17 hgb 7.9, mcv 68 Vitamin d 10.8 tsh 4.75, FT4 1.16  Fall Risk  07/22/2017 05/07/2017 04/09/2017  Falls in the past year? No No No     Depression screen [facility] 2/9 07/22/2017 05/07/2017 04/09/2017  Decreased Interest 0 0 0  Down, Depressed, Hopeless 0 0 0  PHQ - 2 Score 0 0 0    Allergies  Allergen Reactions  . Penicillins Nausea And Vomiting and Other (See Comments)    Passed out Has patient had a PCN reaction causing immediate rash, facial/tongue/throat swelling, SOB or lightheadedness with hypotension: unknown Has patient had a PCN reaction causing severe rash involving mucus membranes or skin necrosis: unknown Has patient had a PCN reaction that required hospitalization: unknown Has patient had a PCN reaction occurring within the last 10 years: no If all of the above answers are "NO", then may proceed with Cephalosporin use.   . Sulfa Antibiotics Other (See Comments)    Pt states that this medication causes blisters in mouth and throat.   . Aspirin Hives and Nausea And Vomiting  . Coconut Flavor Hives      Prior to Admission medications   Medication Sig Start Date End Date Taking? Authorizing Provider  varenicline (CHANTIX PAK) 0.5 MG X 11 & 1 MG X 42 tablet Take one 0.5 mg tab PO daily for 3 days, then increase to one 0.5 mg tab twice daily for 4 days, then increase to one 1 mg tab BID. Patient not taking: Reported on 07/22/2017 05/08/17   Rutherford Guys, MD  varenicline (CHANTIX) 1 MG tablet Take 1 tablet (1 mg total) by mouth 2 (two) times daily. Patient not taking: Reported on 07/22/2017 07/09/17   Rutherford Guys, MD    Past Medical History:  Diagnosis Date  . Abnormal Pap smear    no cervical cancer, just h/o cryotherapy  . Arthritis   . Asthma    no episodes/no inhaler needed  . Endometriosis   . Fibroids   . Gout   . Heartburn during pregnancy   . Lactose intolerance     Past Surgical History:  Procedure Laterality Date  . CESAREAN SECTION N/A 09/20/2012   Procedure: PRIMARY CESAREAN SECTION;  Surgeon: Margarette Asal, MD;  Location: [facility];  Service: Obstetrics;  Laterality: N/A;  Primary edc 8/25  . CESAREAN SECTION WITH BILATERAL TUBAL LIGATION Bilateral 02/09/2014   Procedure: CESAREAN SECTION WITH BILATERAL TUBAL LIGATION;  Surgeon: Logan Bores, MD;  Location: [facility];  Service: Obstetrics;  Laterality: Bilateral;  . CRYOTHERAPY    . DIAGNOSTIC LAPAROSCOPY    . KNEE  SURGERY    . ovarian fibroids      Social History   Tobacco Use  . Smoking status: Former Smoker    Last attempt to quit: 03/27/2002    Years since quitting: 15.3  . Smokeless tobacco: Never Used  Substance Use Topics  . Alcohol use: No    Family History  Problem Relation Age of Onset  . Diabetes Mother   . Hypertension Mother   . Hyperlipidemia Mother   . Heart disease Maternal Grandmother   . Alcohol abuse Neg Hx   . Arthritis Neg Hx   . Asthma Neg Hx   . Birth defects Neg Hx   . Cancer Neg Hx   . COPD Neg Hx   . Depression Neg Hx   . Drug abuse Neg Hx   . Early death Neg Hx    . Hearing loss Neg Hx   . Kidney disease Neg Hx   . Learning disabilities Neg Hx   . Mental illness Neg Hx   . Mental retardation Neg Hx   . Miscarriages / Stillbirths Neg Hx   . Stroke Neg Hx   . Vision loss Neg Hx   . Varicose Veins Neg Hx     ROS Per hpi  OBJECTIVE:  Blood pressure 122/60, pulse (!) 101, temperature 98.6 F (37 C), temperature source Oral, resp. rate 18, height 5' 7.56" (1.716 m), weight 221 lb 12.8 oz (100.6 kg), last menstrual period 06/28/2017, SpO2 98 %.  Physical Exam  Constitutional: She is oriented to person, place, and time. She appears well-developed and well-nourished.  HENT:  Head: Normocephalic and atraumatic.  Mouth/Throat: Mucous membranes are normal.  Eyes: Pupils are equal, round, and reactive to light. EOM are normal. No scleral icterus.  Neck: Neck supple.  Pulmonary/Chest: Effort normal.  Neurological: She is alert and oriented to person, place, and time.  Skin: Skin is warm and dry.  Psychiatric: She has a normal mood and affect.  Nursing note and vitals reviewed.    ASSESSMENT and PLAN  1. Iron deficiency anemia due to chronic blood loss Traditionally thought to be 2/2 DUB however no sign improvement with oral supplementation and resolution of DUB. Also with significant vitamin D deficiency - celiac? Continue with Lyda Perone, gyn plans on iron infusion if not improved - Celiac Disease Ab Screen w/Rfx  2. Subclinical hypothyroidism On low dose levo per gyn, referred to endo. I believe this was trying to optimize any possible contributions to her extreme fatigue  3. Vitamin D deficiency Being supplemented. See #1. Per gyn  4. Snoring Most likely undiagnosed OSA contributing to severe fatigue and excessive daytime sleepiness. Referring to sleep medicine - Ambulatory referral to Sleep Studies  5. Fatigue, unspecified type Severe, debilitating. Currently under workup. Discussed with patient that I would gladly complete paperwork for  intermittent LOA. I do think her sleep study will be positive and once on CPAP energy will improve.  - Comprehensive metabolic panel - Ambulatory referral to Sleep Studies   Return for after sleep.    Rutherford Guys, MD Primary Care at [place], [place] [address] Ph.  [phone] Fax [phone]

## 2017-07-24 LAB — COMPREHENSIVE METABOLIC PANEL
ALT: 10 IU/L (ref 0–32)
AST: 11 IU/L (ref 0–40)
Albumin/Globulin Ratio: 1.4 (ref 1.2–2.2)
Albumin: 3.9 g/dL (ref 3.5–5.5)
Alkaline Phosphatase: 65 IU/L (ref 39–117)
BUN/Creatinine Ratio: 11 (ref 9–23)
BUN: 10 mg/dL (ref 6–24)
Bilirubin Total: <0.2 mg/dL (ref 0.0–1.2)
CO2: 18 mmol/L — ABNORMAL LOW (ref 20–29)
Calcium: 8.9 mg/dL (ref 8.7–10.2)
Chloride: 108 mmol/L — ABNORMAL HIGH (ref 96–106)
Creatinine, Ser: 0.92 mg/dL (ref 0.57–1.00)
GFR calc Af Amer: 90 mL/min/{1.73_m2} (ref >59)
GFR calc non Af Amer: 78 mL/min/{1.73_m2} (ref >59)
Globulin, Total: 2.8 g/dL (ref 1.5–4.5)
Glucose: 99 mg/dL (ref 65–99)
Potassium: 4.6 mmol/L (ref 3.5–5.2)
Sodium: 143 mmol/L (ref 134–144)
Total Protein: 6.7 g/dL (ref 6.0–8.5)

## 2017-07-24 LAB — CELIAC DISEASE AB SCREEN W/RFX
Antigliadin Abs, IgA: 8 units (ref 0–19)
IgA/Immunoglobulin A, Serum: 255 mg/dL (ref 87–352)
Transglutaminase IgA: <2 U/mL (ref 0–3)

## 2017-07-26 ENCOUNTER — Inpatient Hospital Stay (HOSPITAL_COMMUNITY)
Admission: AD | Admit: 2017-07-26 | Discharge: 2017-07-26 | Disposition: A | Discharge status: Home / Self Care | Payer: BLUE CROSS/BLUE SHIELD | Source: Ambulatory Visit | Attending: Obstetrics and Gynecology | Admitting: Obstetrics and Gynecology

## 2017-07-26 ENCOUNTER — Encounter (HOSPITAL_COMMUNITY): Payer: Self-pay

## 2017-07-26 ENCOUNTER — Inpatient Hospital Stay (HOSPITAL_COMMUNITY): Payer: BLUE CROSS/BLUE SHIELD

## 2017-07-26 ENCOUNTER — Other Ambulatory Visit: Payer: Self-pay

## 2017-07-26 DIAGNOSIS — Z79890 Hormone replacement therapy (postmenopausal): Secondary | ICD-10-CM | POA: Diagnosis not present

## 2017-07-26 DIAGNOSIS — R102 Pelvic and perineal pain: Secondary | ICD-10-CM | POA: Diagnosis not present

## 2017-07-26 DIAGNOSIS — M109 Gout, unspecified: Secondary | ICD-10-CM | POA: Insufficient documentation

## 2017-07-26 DIAGNOSIS — E739 Lactose intolerance, unspecified: Secondary | ICD-10-CM | POA: Insufficient documentation

## 2017-07-26 DIAGNOSIS — R109 Unspecified abdominal pain: Secondary | ICD-10-CM | POA: Insufficient documentation

## 2017-07-26 DIAGNOSIS — Z87891 Personal history of nicotine dependence: Secondary | ICD-10-CM | POA: Insufficient documentation

## 2017-07-26 DIAGNOSIS — M1990 Unspecified osteoarthritis, unspecified site: Secondary | ICD-10-CM | POA: Insufficient documentation

## 2017-07-26 DIAGNOSIS — N809 Endometriosis, unspecified: Secondary | ICD-10-CM | POA: Diagnosis not present

## 2017-07-26 DIAGNOSIS — Z9889 Other specified postprocedural states: Secondary | ICD-10-CM

## 2017-07-26 LAB — URINALYSIS, ROUTINE W REFLEX MICROSCOPIC
BACTERIA UA: NONE SEEN
Bilirubin Urine: NEGATIVE
GLUCOSE, UA: NEGATIVE mg/dL
Ketones, ur: NEGATIVE mg/dL
Leukocytes, UA: NEGATIVE
Nitrite: NEGATIVE
PROTEIN: NEGATIVE mg/dL
RBC / HPF: >50 RBC/hpf — ABNORMAL HIGH (ref 0–5)
Specific Gravity, Urine: 1.021 (ref 1.005–1.030)
pH: 5 (ref 5.0–8.0)

## 2017-07-26 LAB — POCT PREGNANCY, URINE: PREG TEST UR: NEGATIVE

## 2017-07-26 MED ORDER — KETOROLAC TROMETHAMINE 60 MG/2ML IM SOLN
60.0000 mg | Freq: Once | INTRAMUSCULAR | Status: AC
  Administered 2017-07-26: 60 mg via INTRAMUSCULAR
  Filled 2017-07-26: qty 2

## 2017-07-26 NOTE — Discharge Instructions (Signed)
Endometriosis Endometriosis is a condition in which the tissue that lines the uterus (endometrium) grows outside of its normal location. The tissue may grow in many locations close to the uterus, but it commonly grows on the ovaries, fallopian tubes, vagina, or bowel. When the uterus sheds the endometrium every menstrual cycle, there is bleeding wherever the endometrial tissue is located. This can cause pain because blood is irritating to tissues that are not normally exposed to it. What are the causes? The cause of endometriosis is not known. What increases the risk? You may be more likely to develop endometriosis if you:  Have a family history of endometriosis.  Have never given birth.  Started your period at age 10 or younger.  Have high levels of estrogen in your body.  Were exposed to a certain medicine (diethylstilbestrol) before you were born (in utero).  Had low birth weight.  Were born as a twin, triplet, or other multiple.  Have a BMI of less than 25. BMI is an estimate of body fat and is calculated from height and weight.  What are the signs or symptoms? Often, there are no symptoms of this condition. If you do have symptoms, they may:  Vary depending on where your endometrial tissue is growing.  Occur during your menstrual period (most common) or midcycle.  Come and go, or you may go months with no symptoms at all.  Stop with menopause.  Symptoms may include:  Pain in the back or abdomen.  Heavier bleeding during periods.  Pain during sex.  Painful bowel movements.  Infertility.  Pelvic pain.  Bleeding more than once a month.  How is this diagnosed? This condition is diagnosed based on your symptoms and a physical exam. You may have tests, such as:  Blood tests and urine tests. These may be done to help rule out other possible causes of your symptoms.  Ultrasound, to look for abnormal tissues.  An X-ray of the lower bowel (barium enema).  An  ultrasound that is done through the vagina (transvaginally).  CT scan.  MRI.  Laparoscopy. In this procedure, a lighted, pencil-sized instrument called a laparoscope is inserted into your abdomen through an incision. The laparoscope allows your health care provider to look at the organs inside your body and check for abnormal tissue to confirm the diagnosis. If abnormal tissue is found, your health care provider may remove a small piece of tissue (biopsy) to be examined under a microscope.  How is this treated? Treatment for this condition may include:  Medicines to relieve pain, such as NSAIDs.  Hormone therapy. This involves using artificial (synthetic) hormones to reduce endometrial tissue growth. Your health care provider may recommend using a hormonal form of birth control, or other medicines.  Surgery. This may be done to remove abnormal endometrial tissue. ? In some cases, tissue may be removed using a laparoscope and a laser (laparoscopic laser treatment). ? In severe cases, surgery may be done to remove the fallopian tubes, uterus, and ovaries (hysterectomy).  Follow these instructions at home:  Take over-the-counter and prescription medicines only as told by your health care provider.  Do not drive or use heavy machinery while taking prescription pain medicine.  Try to avoid activities that cause pain, including sexual activity.  Keep all follow-up visits as told by your health care provider. This is important. Contact a health care provider if:  You have pain in the area between your hip bones (pelvic area) that occurs: ? Before, during, or   after your period. ? In between your period and gets worse during your period. ? During or after sex. ? With bowel movements or urination, especially during your period.  You have problems getting pregnant.  You have a fever. Get help right away if:  You have severe pain that does not get better with medicine.  You have severe  nausea and vomiting, or you cannot eat without vomiting.  You have pain that affects only the lower, right side of your abdomen.  You have abdominal pain that gets worse.  You have abdominal swelling.  You have blood in your stool. This information is not intended to replace advice given to you by your health care provider. Make sure you discuss any questions you have with your health care provider. Document Released: 01/18/2000 Document Revised: 10/26/2015 Document Reviewed: 06/23/2015 Elsevier Interactive Patient Education  2018 [employer]  

## 2017-07-26 NOTE — MAU Note (Addendum)
Cramping since Thursday evening. Has been ok since procedure, sudden onset. Has been taking oxycodone and ibuprofen with no relief. Some light bleeding, wears a panty liner. No vag discharge.  06/03 had d&c and ablation

## 2017-07-26 NOTE — MAU Provider Note (Signed)
History     CSN: [id number]  Arrival date and time: 07/26/17 1528   First Provider Initiated Contact with Patient 07/26/17 1608      Chief Complaint  Patient presents with  . Abdominal Pain  . Vaginal Bleeding   HPI  Paula Cervantes is a 42 y.o. non pregnant female who presents with abdominal pain. She is s/p endometrial ablation by Dr. Terri Piedra on 6/3. Reports no issues since procedure until this past Thursday when pain started. Reports cramping throughout lower abdomen that is intermittent. Rates pain 8/10. Has been taking tylenol, ibuprofen, and oxycodone without relief. Also reports light pink spotting on toilet paper. Denies fever/chills, urinary complaints, n/v/d. States pain feels just like when she has her periods. LMP 5/26.    Past Medical History:  Diagnosis Date  . Abnormal Pap smear    no cervical cancer, just h/o cryotherapy  . Arthritis   . Asthma    no episodes/no inhaler needed  . Endometriosis   . Fibroids   . Gout   . Heartburn during pregnancy   . Lactose intolerance     Past Surgical History:  Procedure Laterality Date  . CESAREAN SECTION N/A 09/20/2012   Procedure: PRIMARY CESAREAN SECTION;  Surgeon: Margarette Asal, MD;  Location: [facility];  Service: Obstetrics;  Laterality: N/A;  Primary edc 8/25  . CESAREAN SECTION WITH BILATERAL TUBAL LIGATION Bilateral 02/09/2014   Procedure: CESAREAN SECTION WITH BILATERAL TUBAL LIGATION;  Surgeon: Logan Bores, MD;  Location: [facility];  Service: Obstetrics;  Laterality: Bilateral;  . CRYOTHERAPY    . DIAGNOSTIC LAPAROSCOPY    . KNEE SURGERY    . ovarian fibroids      Family History  Problem Relation Age of Onset  . Diabetes Mother   . Hypertension Mother   . Hyperlipidemia Mother   . Heart disease Maternal Grandmother   . Alcohol abuse Neg Hx   . Arthritis Neg Hx   . Asthma Neg Hx   . Birth defects Neg Hx   . Cancer Neg Hx   . COPD Neg Hx   . Depression Neg Hx   . Drug abuse Neg Hx   . Early death  Neg Hx   . Hearing loss Neg Hx   . Kidney disease Neg Hx   . Learning disabilities Neg Hx   . Mental illness Neg Hx   . Mental retardation Neg Hx   . Miscarriages / Stillbirths Neg Hx   . Stroke Neg Hx   . Vision loss Neg Hx   . Varicose Veins Neg Hx     Social History   Tobacco Use  . Smoking status: Former Smoker    Last attempt to quit: 03/27/2002    Years since quitting: 15.3  . Smokeless tobacco: Never Used  Substance Use Topics  . Alcohol use: No  . Drug use: No    Allergies:  Allergies  Allergen Reactions  . Penicillins Nausea And Vomiting and Other (See Comments)    Passed out Has patient had a PCN reaction causing immediate rash, facial/tongue/throat swelling, SOB or lightheadedness with hypotension: unknown Has patient had a PCN reaction causing severe rash involving mucus membranes or skin necrosis: unknown Has patient had a PCN reaction that required hospitalization: unknown Has patient had a PCN reaction occurring within the last 10 years: no If all of the above answers are "NO", then may proceed with Cephalosporin use.   . Sulfa Antibiotics Other (See Comments)    Pt states  that this medication causes blisters in mouth and throat.   . Aspirin Hives and Nausea And Vomiting  . Coconut Flavor Hives    Medications Prior to Admission  Medication Sig Dispense Refill Last Dose  . FeBisg-FeCbn-C-FA-B12-Biot-DSS (FERIVA PO) Take 1 tablet by mouth daily.   07/26/2017 at Unknown time  . levothyroxine (SYNTHROID, LEVOTHROID) 25 MCG tablet Take 25 mcg by mouth daily before breakfast.     . varenicline (CHANTIX PAK) 0.5 MG X 11 & 1 MG X 42 tablet Take one 0.5 mg tab PO daily for 3 days, then increase to one 0.5 mg tab twice daily for 4 days, then increase to one 1 mg tab BID. (Patient not taking: Reported on 07/22/2017) 53 tablet 0 Unknown at Unknown time  . varenicline (CHANTIX) 1 MG tablet Take 1 tablet (1 mg total) by mouth 2 (two) times daily. (Patient not taking:  Reported on 07/22/2017) 60 tablet 1 Unknown at Unknown time    Review of Systems  Constitutional: Negative.   Gastrointestinal: Positive for abdominal pain. Negative for constipation, diarrhea, nausea and vomiting.  Genitourinary: Positive for vaginal bleeding. Negative for dysuria, frequency, hematuria and vaginal discharge.   Physical Exam   Blood pressure (!) 129/59, pulse 93, temperature 98.1 F (36.7 C), temperature source Oral, resp. rate 18, height 5\' 7"  (1.702 m), weight 225 lb (102.1 kg), last menstrual period 06/28/2017.  Physical Exam  Nursing note and vitals reviewed. Constitutional: She is oriented to person, place, and time. She appears well-developed and well-nourished. No distress.  HENT:  Head: Normocephalic and atraumatic.  Eyes: Conjunctivae are normal. Right eye exhibits no discharge. Left eye exhibits no discharge. No scleral icterus.  Neck: Normal range of motion.  Respiratory: Effort normal. No respiratory distress.  GI: Soft. Bowel sounds are normal. She exhibits no distension. There is tenderness in the right lower quadrant, suprapubic area and left lower quadrant. There is no rebound and no guarding.  Neurological: She is alert and oriented to person, place, and time.  Skin: Skin is warm and dry. She is not diaphoretic.  Psychiatric: She has a normal mood and affect. Her behavior is normal. Judgment and thought content normal.    MAU Course  Procedures Results for orders placed or performed during the hospital encounter of 07/26/17 (from the past 24 hour(s))  Urinalysis, Routine w reflex microscopic     Status: Abnormal   Collection Time: 07/26/17  4:00 PM  Result Value Ref Range   Color, Urine YELLOW YELLOW   APPearance HAZY (A) CLEAR   Specific Gravity, Urine 1.021 1.005 - 1.030   pH 5.0 5.0 - 8.0   Glucose, UA NEGATIVE NEGATIVE mg/dL   Hgb urine dipstick LARGE (A) NEGATIVE   Bilirubin Urine NEGATIVE NEGATIVE   Ketones, ur NEGATIVE NEGATIVE mg/dL    Protein, ur NEGATIVE NEGATIVE mg/dL   Nitrite NEGATIVE NEGATIVE   Leukocytes, UA NEGATIVE NEGATIVE   RBC / HPF >50 (H) 0 - 5 RBC/hpf   WBC, UA 6-10 0 - 5 WBC/hpf   Bacteria, UA NONE SEEN NONE SEEN   Squamous Epithelial / LPF 0-5 0 - 5   Mucus PRESENT   Pregnancy, urine POC     Status: None   Collection Time: 07/26/17  4:03 PM  Result Value Ref Range   Preg Test, Ur NEGATIVE NEGATIVE   US Pelvic Complete With Transvaginal  Result Date: 07/26/2017 CLINICAL DATA:  Abdominal and pelvic pain. Status post endometrial ablation weeks ago. EXAM: TRANSABDOMINAL AND TRANSVAGINAL ULTRASOUND  OF PELVIS TECHNIQUE: Both transabdominal and transvaginal ultrasound examinations of the pelvis were performed. Transabdominal technique was performed for global imaging of the pelvis including uterus, ovaries, adnexal regions, and pelvic cul-de-sac. It was necessary to proceed with endovaginal exam following the transabdominal exam to visualize the uterus, ovaries and endometrium in better detail. COMPARISON:  None FINDINGS: Uterus Measurements: 12.3 x 0.4 x 7.3 cm. No fibroids or other mass visualized. Endometrium Thickness: 10.2 mm.  No focal abnormality visualized. Right ovary Measurements: 3.9 x 3.8 x 3.1 cm. Normal appearance/no adnexal mass. Left ovary Measurements: 4.8 x 2.7 x 2.4 cm. Normal appearance/no adnexal mass. Other findings No abnormal free fluid. IMPRESSION: Normal examination. Electronically Signed   By: Claudie Revering M.D.   On: 07/26/2017 18:10   MDM UPT negative VSS Toradol 60 mg IM Normal u/s with endometrial thickness of 1 cm Pt reports no improvement in pain -- sleeping when nurse entered room  C/w Dr. Terri Piedra. Ok to discharge home. Pt to f/u if symptoms persist.   Discussed with patient. Pt states she has enough medications at home to get by. Encouraged NSAIDs taken on a schedule.  Assessment and Plan  A: 1. Abdominal pain in female patient   2. Endometriosis   3. Status post endometrial  ablation    P: Discharge home Scheduled NSAIDs for next 3 days F/u with Dr. Terri Piedra if symptoms worsen or persist  Jorje Guild 07/26/2017, 4:09 PM

## 2017-08-19 DIAGNOSIS — E039 Hypothyroidism, unspecified: Secondary | ICD-10-CM | POA: Diagnosis not present

## 2017-08-19 DIAGNOSIS — E559 Vitamin D deficiency, unspecified: Secondary | ICD-10-CM | POA: Diagnosis not present

## 2017-08-19 DIAGNOSIS — E611 Iron deficiency: Secondary | ICD-10-CM | POA: Diagnosis not present

## 2017-08-19 DIAGNOSIS — R140 Abdominal distension (gaseous): Secondary | ICD-10-CM | POA: Diagnosis not present

## 2017-09-15 DIAGNOSIS — D509 Iron deficiency anemia, unspecified: Secondary | ICD-10-CM | POA: Diagnosis not present

## 2017-09-15 DIAGNOSIS — Z1211 Encounter for screening for malignant neoplasm of colon: Secondary | ICD-10-CM | POA: Diagnosis not present

## 2017-09-15 DIAGNOSIS — R799 Abnormal finding of blood chemistry, unspecified: Secondary | ICD-10-CM | POA: Diagnosis not present

## 2017-09-15 DIAGNOSIS — E669 Obesity, unspecified: Secondary | ICD-10-CM | POA: Diagnosis not present

## 2017-09-18 ENCOUNTER — Telehealth: Payer: Self-pay | Admitting: Family Medicine

## 2017-09-18 NOTE — Telephone Encounter (Signed)
Copied from [place] [phone] [phone]. Topic: Quick Communication - See Telephone Encounter >> Sep 18, 2017  2:26 PM Rutherford Nail, [place] wrote: CRM for notification. See Telephone encounter for: 09/18/17. Patient calling and states that she is needing a note. States that Dr Pamella Pert had sent her to a celiacs doctor. The celiacs doctor sent her to a GI doctor because of Celiac doctor. Patient states that the GI doctor told her that she needed a colonoscopy. Patient states she can only get off work to get colonoscopy if it relates to celiacs disease(matching her accommodations paperwork on file at work). Needs Dr Pamella Pert to write the note so that way she can get off work to have the colonoscopy. Needs it to say that the colonoscopy is needed for the celiacs. Please advise.  CB#: [phone]

## 2017-09-19 ENCOUNTER — Encounter (HOSPITAL_COMMUNITY): Payer: Self-pay | Admitting: *Deleted

## 2017-09-19 ENCOUNTER — Inpatient Hospital Stay (HOSPITAL_COMMUNITY)
Admission: AD | Admit: 2017-09-19 | Discharge: 2017-09-20 | Disposition: A | Discharge status: Home / Self Care | Payer: BLUE CROSS/BLUE SHIELD | Source: Ambulatory Visit | Attending: Obstetrics and Gynecology | Admitting: Obstetrics and Gynecology

## 2017-09-19 DIAGNOSIS — N946 Dysmenorrhea, unspecified: Secondary | ICD-10-CM | POA: Diagnosis not present

## 2017-09-19 DIAGNOSIS — R102 Pelvic and perineal pain: Secondary | ICD-10-CM | POA: Diagnosis not present

## 2017-09-19 DIAGNOSIS — Z886 Allergy status to analgesic agent status: Secondary | ICD-10-CM | POA: Diagnosis not present

## 2017-09-19 DIAGNOSIS — Z880 Allergy status to penicillin: Secondary | ICD-10-CM | POA: Insufficient documentation

## 2017-09-19 DIAGNOSIS — Z87891 Personal history of nicotine dependence: Secondary | ICD-10-CM | POA: Diagnosis not present

## 2017-09-19 DIAGNOSIS — Z882 Allergy status to sulfonamides status: Secondary | ICD-10-CM | POA: Diagnosis not present

## 2017-09-19 DIAGNOSIS — R51 Headache: Secondary | ICD-10-CM | POA: Diagnosis not present

## 2017-09-19 DIAGNOSIS — R109 Unspecified abdominal pain: Secondary | ICD-10-CM | POA: Diagnosis not present

## 2017-09-19 LAB — URINALYSIS, ROUTINE W REFLEX MICROSCOPIC
BILIRUBIN URINE: NEGATIVE
Bacteria, UA: NONE SEEN
Glucose, UA: NEGATIVE mg/dL
Ketones, ur: NEGATIVE mg/dL
NITRITE: NEGATIVE
PH: 5 (ref 5.0–8.0)
Protein, ur: NEGATIVE mg/dL
Specific Gravity, Urine: 1.021 (ref 1.005–1.030)

## 2017-09-19 MED ORDER — HYDROMORPHONE HCL 1 MG/ML IJ SOLN
2.0000 mg | Freq: Once | INTRAMUSCULAR | Status: AC
  Administered 2017-09-19: 2 mg via INTRAMUSCULAR
  Filled 2017-09-19: qty 2

## 2017-09-19 MED ORDER — KETOROLAC TROMETHAMINE 60 MG/2ML IM SOLN
60.0000 mg | Freq: Once | INTRAMUSCULAR | Status: AC
  Administered 2017-09-19: 60 mg via INTRAMUSCULAR
  Filled 2017-09-19: qty 2

## 2017-09-19 NOTE — MAU Note (Signed)
Period started yesterday and I usually have bad pain with my period. Had ablation and D&C on 6/3. Abd pain is in lower abd and Oxycodone usually helps me keep it under control but is not helping today

## 2017-09-19 NOTE — MAU Provider Note (Signed)
History     CSN: [id number]  Arrival date and time: 09/19/17 2214   First Provider Initiated Contact with Patient 09/19/17 2339      Chief Complaint  Patient presents with  . Pelvic Pain   Paula Cervantes is a 42 y.o. [id number] who is here today with pelvic pain. She states that she typically has severe cramps with her periods. The cramps are usually eased with ibuprofen and oxycodone. She last tried this medication around 1600, but it did not provide full relief at that time. The pain has continued to increase. She had an endometrial ablation earlier this year, but it has not resolved her symptoms. She is planning a hysterectomy in December. She uses lysteda to control her menstrual bleeding at this time. She has not contacted her doctor's office to inform them of her pain.   Pelvic Pain  The patient's primary symptoms include pelvic pain. This is a new problem. The current episode started today. The problem occurs constantly. The problem has been unchanged. The pain is severe. The problem affects both sides. She is not pregnant. Associated symptoms include nausea. Pertinent negatives include no chills, dysuria, fever, frequency, urgency or vomiting. The vaginal bleeding is typical of menses. Exacerbated by: period starting  She has tried NSAIDs and oral narcotics for the symptoms. The treatment provided no relief. She uses tubal ligation for contraception. Her menstrual history has been regular. Her past medical history is significant for a gynecological surgery and menorrhagia. (Had an ablation earlier this year. )    OB History    Gravida  3   Para  2   Term  2   Preterm      AB  1   Living  2     SAB  1   TAB      Ectopic      Multiple  0   Live Births  2           Past Medical History:  Diagnosis Date  . Abnormal Pap smear    no cervical cancer, just h/o cryotherapy  . Arthritis   . Asthma    no episodes/no inhaler needed  . Endometriosis   . Fibroids   .  Gout   . Heartburn during pregnancy   . Lactose intolerance     Past Surgical History:  Procedure Laterality Date  . CESAREAN SECTION N/A 09/20/2012   Procedure: PRIMARY CESAREAN SECTION;  Surgeon: Margarette Asal, MD;  Location: [facility];  Service: Obstetrics;  Laterality: N/A;  Primary edc 8/25  . CESAREAN SECTION WITH BILATERAL TUBAL LIGATION Bilateral 02/09/2014   Procedure: CESAREAN SECTION WITH BILATERAL TUBAL LIGATION;  Surgeon: Logan Bores, MD;  Location: [facility];  Service: Obstetrics;  Laterality: Bilateral;  . CRYOTHERAPY    . DIAGNOSTIC LAPAROSCOPY    . KNEE SURGERY    . ovarian fibroids      Family History  Problem Relation Age of Onset  . Diabetes Mother   . Hypertension Mother   . Hyperlipidemia Mother   . Heart disease Maternal Grandmother   . Alcohol abuse Neg Hx   . Arthritis Neg Hx   . Asthma Neg Hx   . Birth defects Neg Hx   . Cancer Neg Hx   . COPD Neg Hx   . Depression Neg Hx   . Drug abuse Neg Hx   . Early death Neg Hx   . Hearing loss Neg Hx   . Kidney disease Neg Hx   .  Learning disabilities Neg Hx   . Mental illness Neg Hx   . Mental retardation Neg Hx   . Miscarriages / Stillbirths Neg Hx   . Stroke Neg Hx   . Vision loss Neg Hx   . Varicose Veins Neg Hx     Social History   Tobacco Use  . Smoking status: Former Smoker    Last attempt to quit: 03/27/2002    Years since quitting: 15.4  . Smokeless tobacco: Never Used  Substance Use Topics  . Alcohol use: No  . Drug use: No    Allergies:  Allergies  Allergen Reactions  . Penicillins Nausea And Vomiting and Other (See Comments)    Passed out Has patient had a PCN reaction causing immediate rash, facial/tongue/throat swelling, SOB or lightheadedness with hypotension: unknown Has patient had a PCN reaction causing severe rash involving mucus membranes or skin necrosis: unknown Has patient had a PCN reaction that required hospitalization: unknown Has patient had a PCN reaction  occurring within the last 10 years: no If all of the above answers are "NO", then may proceed with Cephalosporin use.   . Sulfa Antibiotics Other (See Comments)    Pt states that this medication causes blisters in mouth and throat.   . Aspirin Hives and Nausea And Vomiting  . Coconut Flavor Hives    Medications Prior to Admission  Medication Sig Dispense Refill Last Dose  . FeFum-FePoly-FA-B Cmp-C-Biot (FOLIVANE-PLUS PO) Take by mouth.     Marland Kitchen ibuprofen (ADVIL,MOTRIN) 600 MG tablet Take 600 mg by mouth every 6 (six) hours as needed.    at 1600  . oxycodone-acetaminophen (PERCOCET) 2.5-325 MG tablet Take 1 tablet by mouth every 4 (four) hours as needed for pain.    at 1500  . tranexamic acid (LYSTEDA) 650 MG TABS tablet Take 1,300 mg by mouth 3 (three) times daily.     . Vitamin D, Ergocalciferol, (DRISDOL) 50000 units CAPS capsule Take 50,000 Units by mouth every 7 (seven) days.     Marland Kitchen FeBisg-FeCbn-C-FA-B12-Biot-DSS (FERIVA PO) Take 1 tablet by mouth daily.   07/26/2017 at Unknown time  . levothyroxine (SYNTHROID, LEVOTHROID) 25 MCG tablet Take 50 mcg by mouth daily before breakfast.        Review of Systems  Constitutional: Negative for chills and fever.  Gastrointestinal: Positive for nausea. Negative for vomiting.  Genitourinary: Positive for menorrhagia, pelvic pain and vaginal bleeding. Negative for dysuria, frequency and urgency.   Physical Exam   Blood pressure (!) 148/80, pulse 87, temperature 98.4 F (36.9 C), resp. rate 18, height 5\' 7"  (1.702 m), weight 102.1 kg, last menstrual period 09/18/2017.  Physical Exam  Nursing note and vitals reviewed. Constitutional: She is oriented to person, place, and time. She appears well-developed and well-nourished. No distress.  HENT:  Head: Normocephalic.  Cardiovascular: Normal rate.  Respiratory: Effort normal.  GI: Soft. There is no tenderness. There is no rebound.  Neurological: She is alert and oriented to person, place, and time.   Skin: Skin is warm and dry.  Psychiatric: She has a normal mood and affect.   Results for orders placed or performed during the hospital encounter of 09/19/17 (from the past 24 hour(s))  Urinalysis, Routine w reflex microscopic     Status: Abnormal   Collection Time: 09/19/17 10:54 PM  Result Value Ref Range   Color, Urine YELLOW YELLOW   APPearance CLEAR CLEAR   Specific Gravity, Urine 1.021 1.005 - 1.030   pH 5.0 5.0 - 8.0  Glucose, UA NEGATIVE NEGATIVE mg/dL   Hgb urine dipstick MODERATE (A) NEGATIVE   Bilirubin Urine NEGATIVE NEGATIVE   Ketones, ur NEGATIVE NEGATIVE mg/dL   Protein, ur NEGATIVE NEGATIVE mg/dL   Nitrite NEGATIVE NEGATIVE   Leukocytes, UA TRACE (A) NEGATIVE   RBC / HPF 11-20 0 - 5 RBC/hpf   WBC, UA 0-5 0 - 5 WBC/hpf   Bacteria, UA NONE SEEN NONE SEEN   Squamous Epithelial / LPF 0-5 0 - 5   Mucus PRESENT     MAU Course  Procedures  MDM Patient treated with dilaudid and toradol here as this has worked for her in the past when she has needed it. She reports that her pain is better. Patient advised to FU with PCP for continued pain management.   Assessment and Plan   1. Dysmenorrhea    DC home Comfort measures reviewed  Bleeding precautions RX: no new rx provided  Return to MAU as needed   Follow-up Information    Sherlyn Hay, DO Follow up.   Specialty:  Obstetrics and Gynecology Why:  Call her office for any refills needed on long term pain medications as we cannot provide refills here.  Contact information: [place] [address] [phone]            Marcille Buffy 09/19/2017, 11:40 PM

## 2017-09-20 DIAGNOSIS — R51 Headache: Secondary | ICD-10-CM

## 2017-09-20 DIAGNOSIS — R109 Unspecified abdominal pain: Secondary | ICD-10-CM

## 2017-09-20 DIAGNOSIS — N946 Dysmenorrhea, unspecified: Secondary | ICD-10-CM

## 2017-09-20 NOTE — Progress Notes (Signed)
Written and verbal d/c instructions given and understanding voiced. 

## 2017-09-20 NOTE — Discharge Instructions (Signed)
Dysmenorrhea °Menstrual cramps (dysmenorrhea) are caused by the muscles of the uterus tightening (contracting) during a menstrual period. For some women, this discomfort is merely bothersome. For others, dysmenorrhea can be severe enough to interfere with everyday activities for a few days each month. °Primary dysmenorrhea is menstrual cramps that last a couple of days when you start having menstrual periods or soon after. This often begins after a teenager starts having her period. As a woman gets older or has a baby, the cramps will usually lessen or disappear. Secondary dysmenorrhea begins later in life, lasts longer, and the pain may be stronger than primary dysmenorrhea. The pain may start before the period and last a few days after the period. °What are the causes? °Dysmenorrhea is usually caused by an underlying problem, such as: °· The tissue lining the uterus grows outside of the uterus in other areas of the body (endometriosis). °· The endometrial tissue, which normally lines the uterus, is found in or grows into the muscular walls of the uterus (adenomyosis). °· The pelvic blood vessels are engorged with blood just before the menstrual period (pelvic congestive syndrome). °· Overgrowth of cells (polyps) in the lining of the uterus or cervix. °· Falling down of the uterus (prolapse) because of loose or stretched ligaments. °· Depression. °· Bladder problems, infection, or inflammation. °· Problems with the intestine, a tumor, or irritable bowel syndrome. °· Cancer of the female organs or bladder. °· A severely tipped uterus. °· A very tight opening or closed cervix. °· Noncancerous tumors of the uterus (fibroids). °· Pelvic inflammatory disease (PID). °· Pelvic scarring (adhesions) from a previous surgery. °· Ovarian cyst. °· An intrauterine device (IUD) used for birth control. °What increases the risk? °You may be at greater risk of dysmenorrhea if: °· You are younger than age 30. °· You started puberty  early. °· You have irregular or heavy bleeding. °· You have never given birth. °· You have a family history of this problem. °· You are a smoker. °What are the signs or symptoms? °· Cramping or throbbing pain in your lower abdomen. °· Headaches. °· Lower back pain. °· Nausea or vomiting. °· Diarrhea. °· Sweating or dizziness. °· Loose stools. °How is this diagnosed? °A diagnosis is based on your history, symptoms, physical exam, diagnostic tests, or procedures. Diagnostic tests or procedures may include: °· Blood tests. °· Ultrasonography. °· An examination of the lining of the uterus (dilation and curettage, D&C). °· An examination inside your abdomen or pelvis with a scope (laparoscopy). °· X-rays. °· CT scan. °· MRI. °· An examination inside the bladder with a scope (cystoscopy). °· An examination inside the intestine or stomach with a scope (colonoscopy, gastroscopy). °How is this treated? °Treatment depends on the cause of the dysmenorrhea. Treatment may include: °· Pain medicine prescribed by your health care provider. °· Birth control pills or an IUD with progesterone hormone in it. °· Hormone replacement therapy. °· Nonsteroidal anti-inflammatory drugs (NSAIDs). These may help stop the production of prostaglandins. °· Surgery to remove adhesions, endometriosis, ovarian cyst, or fibroids. °· Removal of the uterus (hysterectomy). °· Progesterone shots to stop the menstrual period. °· Cutting the nerves on the sacrum that go to the female organs (presacral neurectomy). °· Electric current to the sacral nerves (sacral nerve stimulation). °· Antidepressant medicine. °· Psychiatric therapy, counseling, or group therapy. °· Exercise and physical therapy. °· Meditation and yoga therapy. °· Acupuncture. °Follow these instructions at home: °· Only take over-the-counter or prescription medicines as directed   by your health care provider. °· Place a heating pad or hot water bottle on your lower back or abdomen. Do not  sleep with the heating pad. °· Use aerobic exercises, walking, swimming, biking, and other exercises to help lessen the cramping. °· Massage to the lower back or abdomen may help. °· Stop smoking. °· Avoid alcohol and caffeine. °Contact a health care provider if: °· Your pain does not get better with medicine. °· You have pain with sexual intercourse. °· Your pain increases and is not controlled with medicines. °· You have abnormal vaginal bleeding with your period. °· You develop nausea or vomiting with your period that is not controlled with medicine. °Get help right away if: °You pass out. °This information is not intended to replace advice given to you by your health care provider. Make sure you discuss any questions you have with your health care provider. °Document Released: 01/20/2005 Document Revised: 06/28/2015 Document Reviewed: 07/08/2012 °Elsevier Interactive Patient Education © 2017 [employer] ° °

## 2017-09-21 NOTE — Telephone Encounter (Signed)
Copied from [place] [phone]. Topic: Quick Communication - See Telephone Encounter >> Sep 18, 2017  2:26 PM Rutherford Nail, [place] wrote: CRM for notification. See Telephone encounter for: 09/18/17. Patient calling and states that she is needing a note. States that Dr Pamella Pert had sent her to a celiacs doctor. The celiacs doctor sent her to a GI doctor because of Celiac doctor. Patient states that the GI doctor told her that she needed a colonoscopy. Patient states she can only get off work to get colonoscopy if it relates to celiacs disease(matching her accommodations paperwork on file at work). Needs Dr Pamella Pert to write the note so that way she can get off work to have the colonoscopy. Needs it to say that the colonoscopy is needed for the celiacs. Please advise.  CB#: [phone] >> Sep 21, 2017  3:35 PM Bea Graff, [place] wrote: Pt would like to speak with Dr. Inetta Fermo nurse to have her accommodations papers updated to make it monthly. She is unsure what exactly that means but thinks it may be a box on the forms. Please call to discuss. CB#: [phone]. She is trying to get her colonoscopy done this Friday.

## 2017-09-21 NOTE — Telephone Encounter (Signed)
Patient checking status, needs letter asap, colonoscopy is scheduled for Friday. Please advise

## 2017-09-22 ENCOUNTER — Telehealth: Payer: Self-pay

## 2017-09-22 ENCOUNTER — Institutional Professional Consult (permissible substitution): Payer: BLUE CROSS/BLUE SHIELD | Admitting: Neurology

## 2017-09-22 NOTE — Telephone Encounter (Signed)
Pt did not show for their appt with Dr. Athar today.  

## 2017-09-22 NOTE — Telephone Encounter (Signed)
Please advise 

## 2017-09-23 ENCOUNTER — Encounter: Payer: Self-pay | Admitting: Neurology

## 2017-09-23 NOTE — Telephone Encounter (Signed)
Ok. There is a lot of confusion here.  1. I tested her or celiac given her anemia and vitamin D deficiency. The test was negative. She does NOT have celiac disease.  2. I can not find a referral made by me to GI for a colonoscopy, though I do think it is reasonable given her anemia. So please WRITE HER A LETTER stating that indeed a colonoscopy is part of her work up for her anemia and it is medically needed.  3. Where I did refer her to was to neurology for sleep evaluation as I am worried she has undiagnosed obstructive sleep apnea and she NO SHOWED that appointment. Please have her reschedule.   4. FMLA was completed so that she can take care of all these medical appointments and find a reason to her extreme fatigue and anemia. It does not mention any given diagnosis.  Thank you I [place]

## 2017-09-24 ENCOUNTER — Encounter: Payer: Self-pay | Admitting: *Deleted

## 2017-09-24 ENCOUNTER — Telehealth: Payer: Self-pay | Admitting: Family Medicine

## 2017-09-24 NOTE — Telephone Encounter (Signed)
Left message for patient no release  Please advise patient a letter was written for her colonoscopy.  Could not fax did not know where she was having.  If she could give [place] the number we can fax.  Also a letter is up front if she would like to pick up.   Also advise patient she needs to reschedule her neurology appointment.

## 2017-09-25 DIAGNOSIS — K3189 Other diseases of stomach and duodenum: Secondary | ICD-10-CM | POA: Diagnosis not present

## 2017-09-25 DIAGNOSIS — D509 Iron deficiency anemia, unspecified: Secondary | ICD-10-CM | POA: Diagnosis not present

## 2017-09-25 DIAGNOSIS — K210 Gastro-esophageal reflux disease with esophagitis: Secondary | ICD-10-CM | POA: Diagnosis not present

## 2017-09-25 DIAGNOSIS — K635 Polyp of colon: Secondary | ICD-10-CM | POA: Diagnosis not present

## 2017-09-25 DIAGNOSIS — D125 Benign neoplasm of sigmoid colon: Secondary | ICD-10-CM | POA: Diagnosis not present

## 2017-09-25 DIAGNOSIS — Z1211 Encounter for screening for malignant neoplasm of colon: Secondary | ICD-10-CM | POA: Diagnosis not present

## 2017-09-25 HISTORY — PX: COLONOSCOPY: SHX174

## 2017-09-25 HISTORY — PX: UPPER GI ENDOSCOPY: SHX6162

## 2017-09-29 ENCOUNTER — Telehealth: Payer: Self-pay | Admitting: Family Medicine

## 2017-09-29 ENCOUNTER — Encounter: Payer: Self-pay | Admitting: Family Medicine

## 2017-09-29 NOTE — Telephone Encounter (Signed)
Copied from [place] [id number]. Topic: General - Other >> Sep 29, 2017  1:57 PM Bea Graff, NT wrote: Reason for CRM: Pt states she dropped off paperwork last week to be completed for her job and she is wanting to check status on them being completed.

## 2017-09-30 ENCOUNTER — Encounter: Payer: Self-pay | Admitting: Family Medicine

## 2017-09-30 NOTE — Telephone Encounter (Signed)
Patient states she sent paperwork over to be completed--I do not have any paperwork for this patient. Have you seen any?  If so can I please get a copy of this so that we have a copy on file for her.  Thank you

## 2017-10-01 ENCOUNTER — Encounter: Payer: Self-pay | Admitting: Family Medicine

## 2017-10-01 NOTE — Telephone Encounter (Signed)
Sent patient a Pharmacist, community message asking her to make an appointment so that we can update her paperwork.   I will hold to forms at my desk until then.

## 2017-10-06 ENCOUNTER — Ambulatory Visit: Payer: BLUE CROSS/BLUE SHIELD | Admitting: Family Medicine

## 2017-10-06 NOTE — Telephone Encounter (Signed)
I have the patients paperwork for her disability claim. I will hold on to it till after her appointment today 10/06/17 with Dr Pamella Pert.

## 2017-10-07 ENCOUNTER — Ambulatory Visit: Payer: BLUE CROSS/BLUE SHIELD | Admitting: Family Medicine

## 2017-10-07 ENCOUNTER — Other Ambulatory Visit: Payer: Self-pay

## 2017-10-07 ENCOUNTER — Encounter: Payer: Self-pay | Admitting: Family Medicine

## 2017-10-07 VITALS — BP 121/79 | HR 87 | Temp 98.0°F | Ht 67.0 in | Wt 228.0 lb

## 2017-10-07 DIAGNOSIS — D500 Iron deficiency anemia secondary to blood loss (chronic): Secondary | ICD-10-CM

## 2017-10-07 DIAGNOSIS — R5383 Other fatigue: Secondary | ICD-10-CM | POA: Diagnosis not present

## 2017-10-07 DIAGNOSIS — N938 Other specified abnormal uterine and vaginal bleeding: Secondary | ICD-10-CM

## 2017-10-07 DIAGNOSIS — Z860100 Personal history of colon polyps, unspecified: Secondary | ICD-10-CM | POA: Insufficient documentation

## 2017-10-07 NOTE — Telephone Encounter (Signed)
Paperwork updated and faxed to employer on 10/07/17

## 2017-10-07 NOTE — Progress Notes (Signed)
9/4/20198:15 AM  Otho Bellows Sep 27, 1975, 42 y.o. female [id number]  Chief Complaint  Patient presents with  . Follow-up    completing paperwork for job for requested time off due to condition    HPI:   Patient is a 42 y.o. female with past medical history significant for severe iron deficiency, DUB 2/2 uterine fibroids, migraines who presents today for followup on iron deficiency and fatigue  Had to cancel sleep study as could not take time off work Saw Dr Collene Mares, GI, did EGD and colonoscopy. +, ex polyps. 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  Taking FOLIVANE prescribed x 3 months, per  Pt last check still low Requesting revision of FMLA Currently described per week, to change per month so that she really has flexibility to take days off as needed. Requesting days 12 days/mth. This also allows for her to use time for doctors appointments as she has no sick time.  Planned hysterectomy for this Dec, Dr Terri Piedra Still waking up very tired. Heavy weight feeling  CBC Latest Ref Rng & Units 04/16/2017 04/09/2017 05/05/2014  WBC 3.4 - 10.8 x10E3/uL 6.8 CANCELED 4.3  Hemoglobin 11.1 - 15.9 g/dL 7.0(LL) CANCELED 13.0  Hematocrit 34.0 - 46.6 % 24.5(L) CANCELED 40.3  Platelets 150 - 379 x10E3/uL 457(H) CANCELED 368    Fall Risk  10/07/2017 07/22/2017 05/07/2017 04/09/2017  Falls in the past year? No No No No     Depression screen [facility] 2/9 10/07/2017 07/22/2017 05/07/2017  Decreased Interest 0 0 0  Down, Depressed, Hopeless 0 0 0  PHQ - 2 Score 0 0 0    Allergies  Allergen Reactions  . Penicillins Nausea And Vomiting and Other (See Comments)    Passed out Has patient had a PCN reaction causing immediate rash, facial/tongue/throat swelling, SOB or lightheadedness with hypotension: unknown Has patient had a PCN reaction causing severe rash involving mucus membranes or skin necrosis: unknown Has patient had a PCN reaction that required  hospitalization: unknown Has patient had a PCN reaction occurring within the last 10 years: no If all of the above answers are "NO", then may proceed with Cephalosporin use.   . Sulfa Antibiotics Other (See Comments)    Pt states that this medication causes blisters in mouth and throat.   . Aspirin Hives and Nausea And Vomiting  . Coconut Flavor Hives    Prior to Admission medications   Medication Sig Start Date End Date Taking? Authorizing Provider  ibuprofen (ADVIL,MOTRIN) 600 MG tablet Take 600 mg by mouth every 6 (six) hours as needed.   Yes [provider]  levothyroxine (SYNTHROID, LEVOTHROID) 25 MCG tablet Take 50 mcg by mouth daily before breakfast.    Yes [provider]  oxycodone-acetaminophen (PERCOCET) 2.5-325 MG tablet Take 1 tablet by mouth every 4 (four) hours as needed for pain.   Yes [provider]  tranexamic acid (LYSTEDA) 650 MG TABS tablet Take 1,300 mg by mouth 3 (three) times daily.   Yes [provider]  Vitamin D, Ergocalciferol, (DRISDOL) 50000 units CAPS capsule Take 50,000 Units by mouth every 7 (seven) days.   Yes [provider]  FeBisg-FeCbn-C-FA-B12-Biot-DSS (FERIVA PO) Take 1 tablet by mouth daily.    [provider]  FeFum-FePoly-FA-B Cmp-C-Biot (FOLIVANE-PLUS PO) Take by mouth.    [provider]    Past Medical History:  Diagnosis Date  . Abnormal Pap smear    no cervical cancer,  just h/o cryotherapy  . Arthritis   . Asthma    no episodes/no inhaler needed  . Endometriosis   . Fibroids   . Gout   . Heartburn during pregnancy   . Lactose intolerance     Past Surgical History:  Procedure Laterality Date  . CESAREAN SECTION N/A 09/20/2012   Procedure: PRIMARY CESAREAN SECTION;  Surgeon: Margarette Asal, MD;  Location: [facility];  Service: Obstetrics;  Laterality: N/A;  Primary edc 8/25  . CESAREAN SECTION WITH BILATERAL TUBAL LIGATION Bilateral 02/09/2014   Procedure: CESAREAN  SECTION WITH BILATERAL TUBAL LIGATION;  Surgeon: Logan Bores, MD;  Location: [facility];  Service: Obstetrics;  Laterality: Bilateral;  . CRYOTHERAPY    . DIAGNOSTIC LAPAROSCOPY    . KNEE SURGERY    . ovarian fibroids      Social History   Tobacco Use  . Smoking status: Former Smoker    Last attempt to quit: 03/27/2002    Years since quitting: 15.5  . Smokeless tobacco: Never Used  Substance Use Topics  . Alcohol use: No    Family History  Problem Relation Age of Onset  . Diabetes Mother   . Hypertension Mother   . Hyperlipidemia Mother   . Heart disease Maternal Grandmother   . Alcohol abuse Neg Hx   . Arthritis Neg Hx   . Asthma Neg Hx   . Birth defects Neg Hx   . Cancer Neg Hx   . COPD Neg Hx   . Depression Neg Hx   . Drug abuse Neg Hx   . Early death Neg Hx   . Hearing loss Neg Hx   . Kidney disease Neg Hx   . Learning disabilities Neg Hx   . Mental illness Neg Hx   . Mental retardation Neg Hx   . Miscarriages / Stillbirths Neg Hx   . Stroke Neg Hx   . Vision loss Neg Hx   . Varicose Veins Neg Hx     Review of Systems  Constitutional: Positive for malaise/fatigue. Negative for chills and fever.  Respiratory: Negative for cough and shortness of breath.   Cardiovascular: Negative for chest pain, palpitations and leg swelling.  Gastrointestinal: Negative for abdominal pain, nausea and vomiting.     OBJECTIVE:  Blood pressure 121/79, pulse 87, temperature 98 F (36.7 C), temperature source Oral, height 5\' 7"  (1.702 m), weight 228 lb (103.4 kg), last menstrual period 09/18/2017, SpO2 96 %. Body mass index is 35.71 kg/m.   Physical Exam  Constitutional: She is oriented to person, place, and time. She appears well-developed and well-nourished.  HENT:  Head: Normocephalic and atraumatic.  Mouth/Throat: Mucous membranes are normal.  Eyes: Pupils are equal, round, and reactive to light. Conjunctivae and EOM are normal. No scleral icterus.  Neck: Neck  supple.  Pulmonary/Chest: Effort normal.  Neurological: She is alert and oriented to person, place, and time.  Skin: Skin is warm and dry.  Psychiatric: She has a normal mood and affect.  Nursing note and vitals reviewed.   ASSESSMENT and PLAN  1. Iron deficiency anemia due to chronic blood loss Patient with significant anemia, hemodynamically stable. Seems multifactorial (DUB and possible celiac) currently working on treating both. If repeat CBC/iron studies show that she is not  responding to oral iron then will arrange for IV iron. Requesting records from Endo and GI. - CBC with Differential/Platelet - Iron, TIBC and Ferritin Panel  2. Fatigue, unspecified type 2/2 anemia. Concern for contribution from possible  OSA. She will reschedule sleep study once FMLA papers redone. Will adjust her leave as requested, 12 days a month. At this point anticipate this to be needed until anemia is improved and sleep eval and any needed treatments in place.   3. DUB (dysfunctional uterine bleeding) Per gyn, pending hyst in dec  4. History of colonic polyps Requesting records  Return in about 4 weeks (around 11/04/2017).    Rutherford Guys, MD Primary Care at [place], [place] [address] Ph.  [phone] Fax [phone]

## 2017-10-07 NOTE — Patient Instructions (Signed)
° ° ° °  If you have lab work done today you will be contacted with your lab results within the next 2 weeks.  If you have not heard from us then please contact us. The fastest way to get your results is to register for My Chart. ° ° °IF you received an x-ray today, you will receive an invoice from [facility]. Please contact [facility] at [phone] with questions or concerns regarding your invoice.  ° °IF you received labwork today, you will receive an invoice from [facility]. Please contact [facility] at [phone] with questions or concerns regarding your invoice.  ° °Our billing staff will not be able to assist you with questions regarding bills from these companies. ° °You will be contacted with the lab results as soon as they are available. The fastest way to get your results is to activate your My Chart account. Instructions are located on the last page of this paperwork. If you have not heard from us regarding the results in 2 weeks, please contact this office. °  ° ° ° °

## 2017-10-08 LAB — CBC WITH DIFFERENTIAL/PLATELET
Basophils Absolute: 0.1 10*3/uL (ref 0.0–0.2)
Basos: 1 %
EOS (ABSOLUTE): 0.3 10*3/uL (ref 0.0–0.4)
Eos: 5 %
Hematocrit: 36.2 % (ref 34.0–46.6)
Hemoglobin: 10.5 g/dL — ABNORMAL LOW (ref 11.1–15.9)
Immature Grans (Abs): 0 10*3/uL (ref 0.0–0.1)
Immature Granulocytes: 0 %
Lymphocytes Absolute: 2.3 10*3/uL (ref 0.7–3.1)
Lymphs: 39 %
MCH: 22.4 pg — ABNORMAL LOW (ref 26.6–33.0)
MCHC: 29 g/dL — ABNORMAL LOW (ref 31.5–35.7)
MCV: 77 fL — ABNORMAL LOW (ref 79–97)
Monocytes Absolute: 0.9 10*3/uL (ref 0.1–0.9)
Monocytes: 15 %
Neutrophils Absolute: 2.4 10*3/uL (ref 1.4–7.0)
Neutrophils: 40 %
Platelets: 436 10*3/uL (ref 150–450)
RBC: 4.69 x10E6/uL (ref 3.77–5.28)
RDW: 20 % — ABNORMAL HIGH (ref 12.3–15.4)
WBC: 5.9 10*3/uL (ref 3.4–10.8)

## 2017-10-08 LAB — IRON,TIBC AND FERRITIN PANEL
Ferritin: 11 ng/mL — ABNORMAL LOW (ref 15–150)
Iron Saturation: 6 % — CL (ref 15–55)
Iron: 24 ug/dL — ABNORMAL LOW (ref 27–159)
Total Iron Binding Capacity: 431 ug/dL (ref 250–450)
UIBC: 407 ug/dL (ref 131–425)

## 2017-10-12 NOTE — Telephone Encounter (Signed)
Spoke with pt this Morning to follow-up and she informed me that everything was done and she has an appointment set for Neurology.

## 2017-10-14 DIAGNOSIS — R070 Pain in throat: Secondary | ICD-10-CM | POA: Diagnosis not present

## 2017-10-14 DIAGNOSIS — R05 Cough: Secondary | ICD-10-CM | POA: Diagnosis not present

## 2017-10-14 DIAGNOSIS — H6690 Otitis media, unspecified, unspecified ear: Secondary | ICD-10-CM | POA: Diagnosis not present

## 2017-11-14 ENCOUNTER — Encounter (HOSPITAL_COMMUNITY): Payer: Self-pay | Admitting: Emergency Medicine

## 2017-11-14 ENCOUNTER — Other Ambulatory Visit: Payer: Self-pay

## 2017-11-14 ENCOUNTER — Emergency Department (HOSPITAL_COMMUNITY)
Admission: EM | Admit: 2017-11-14 | Discharge: 2017-11-14 | Disposition: A | Discharge status: Home / Self Care | Payer: BLUE CROSS/BLUE SHIELD | Attending: Emergency Medicine | Admitting: Emergency Medicine

## 2017-11-14 DIAGNOSIS — Z79899 Other long term (current) drug therapy: Secondary | ICD-10-CM | POA: Insufficient documentation

## 2017-11-14 DIAGNOSIS — R1030 Lower abdominal pain, unspecified: Secondary | ICD-10-CM | POA: Diagnosis not present

## 2017-11-14 DIAGNOSIS — Z87891 Personal history of nicotine dependence: Secondary | ICD-10-CM | POA: Diagnosis not present

## 2017-11-14 DIAGNOSIS — N946 Dysmenorrhea, unspecified: Secondary | ICD-10-CM | POA: Insufficient documentation

## 2017-11-14 DIAGNOSIS — R102 Pelvic and perineal pain: Secondary | ICD-10-CM | POA: Diagnosis not present

## 2017-11-14 DIAGNOSIS — J45909 Unspecified asthma, uncomplicated: Secondary | ICD-10-CM | POA: Diagnosis not present

## 2017-11-14 LAB — POC URINE PREG, ED: PREG TEST UR: NEGATIVE

## 2017-11-14 MED ORDER — HYDROCODONE-ACETAMINOPHEN 5-325 MG PO TABS
1.0000 | ORAL_TABLET | Freq: Once | ORAL | Status: AC
  Administered 2017-11-14: 1 via ORAL
  Filled 2017-11-14: qty 1

## 2017-11-14 NOTE — Discharge Instructions (Addendum)
Please return to the Emergency Department for any new or worsening symptoms or if your symptoms do not improve. Please be sure to follow up with your Primary Care Physician as soon as possible regarding your visit today. If you do not have a Primary Doctor please use the resources below to establish one. Unfortunately it is against policy to refill narcotic prescriptions in the emergency department.  You have been given dose of narcotic medication in the emergency department today.  Please do not drive today because these medications can make you drowsy. You may use over-the-counter anti-inflammatory medications such as ibuprofen to help with your pain and as directed on the packaging.  Please continue to use warm compresses to help with your pain. Please call your OB/GYN's office and follow-up with them as soon as possible for further evaluation and medication refill.  Contact a health care provider if: Your pain does not get better with medicine. You have pain with sexual intercourse. Your pain increases and is not controlled with medicines. You have abnormal vaginal bleeding with your period. You develop nausea or vomiting with your period that is not controlled with medicine. Get help right away if:              You pass out.              You get dizzy or lightheaded              You have new or unusual pain or a change in your pain.  Do not take your medicine if  develop an itchy rash, swelling in your mouth or lips, or difficulty breathing.   RESOURCE GUIDE  Chronic Pain Problems: Contact [facility]  [phone] Patients need to be referred by their primary care doctor.  Insufficient Money for Medicine: Contact [employer]:  call "211" or [place] [phone].  No Primary Care Doctor: Call [facility]  [phone] - can help you locate a primary care doctor that  accepts your insurance, provides certain services, etc. Physician Referral Service-  [phone]  Agencies that provide inexpensive medical care: [facility]  [facility]  [phone] [facility]  [phone] [facility]  [phone] [facility]  [phone] [facility]  [phone]  [place] Providers: [facility]- [address], [address]  [phone], Mon-Fri 9am-7pm, Sat 9am-1pm [place], [address] [place]  [place], [address] [place]  [place]- [address]  [place], [address], [phone]  Only accepts [place] patients after they have their name  applied to their card  Self Pay (no insurance) in [facility]: Sickle Cell Patients: Dr Kevan Ny, [facility]  [place], [facility]- [place]  [facility] [place]- [address], [facility]- see information above (Speak to [employer] if you do not have insurance)       -  [facility]- [place], [place],  [place]       -  [facility]- [address] [place]-  [address], [place], [facility]- [address], [phone]       -  [facility] [place]- [address], [place], also [address] [phone]       -    [facility]- [address], [place], 1st & 3rd Saturday   every month, 10am-1pm  1) Find a Doctor and Pay Out of Pocket Although you won't have to find out who is covered by your insurance plan, it is a good idea to ask around and get recommendations. You will then need to call the office and see if the doctor you  have chosen will accept you as a new patient and what types of options they offer for patients who are self-pay. Some doctors offer discounts or will set up payment plans for their patients who do not have insurance, but you will need to ask so you aren't surprised when you get to your appointment.  2) Contact Your Local Health Department Not all health departments have doctors that can see patients for sick visits, but many do, so it is worth a call to see if yours does. If you don't know where your local health department is, you can check in your phone book. The CDC also has a tool to help you locate your state's health department, and many state websites also have listings of all of their local health departments.  3) Find a [facility] If your illness is not likely to be very severe or complicated, you may want to try a walk in clinic. These are popping up all over the country in pharmacies, drugstores, and shopping centers. They're usually staffed by nurse practitioners or physician assistants that have been trained to treat common illnesses and complaints. They're usually fairly quick and inexpensive. However, if you have serious medical issues or chronic medical problems, these are probably not your best option  [facility], [facility], [address] [place], phone [phone] or [phone].  Monday - Friday, call for an appointment. [place], [facility], [address], [place], phone [phone] or [phone].  Monday - Friday, call for an appointment.  Abuse/Neglect: [place] [phone] [place] [phone] (After Hours)  Emergency Shelter:  [facility] [phone]  Maternity Homes: [facility] at the [place] [phone] [place] [phone]  MRSA Hotline #:    (215)793-4101  [facility] Dept. [address]         [place] Phone:  [phone]                                  Phone:  [phone]                   Phone:  [place], [place]- [phone]       -     [facility] in [place], [address],                                  [place] [phone] or [phone] (After Hours)   [place]  Substance Abuse Resources: [facility] and Drug Services  [phone] [place] [phone] [facility] Chinita Pester [phone] Residential & Outpatient Substance Abuse Program  [phone]  Psychological Services: [place]  [phone] [place]  [place], [place]. [address], [place], [place]: [phone] or [phone], [employer]  Dental Assistance  If unable to pay or uninsured, contact:  [facility] or [facility]. to become qualified for the adult dental clinic.  Patients with Medicaid: [facility] [phone] [address], [place] [address], [phone]  If unable to pay, or uninsured, contact [facility] [phone]) or [place] [phone] in [place], [place] in [facility]) to become qualified for the adult dental clinic   [address] Park[facility]- [place], [place], [place], [address], [place], [place], 2nd and 4th Thursday of the month at 6:30am.  10 clients each day  by appointment, can sometimes see walk-in patients if someone does not show for an appointment. [facility]- [address] [place] [address]er [place], [place], [address], [place], [place], [place], [address], [place] Department- [phone] [place] [facility] [phone]

## 2017-11-14 NOTE — ED Provider Notes (Signed)
[place] EMERGENCY DEPARTMENT Provider Note   CSN: [id number] Arrival date & time: 11/14/17  0855     History   Chief Complaint Chief Complaint  Patient presents with  . Abdominal Pain    "uterus"    HPI Paula Cervantes is a 42 y.o. female presenting for hydrocodone refill.  Patient states that she has very heavy and painful periods for the past few months.  Patient states that she is seen and managed by Dr. Terri Piedra at [facility] for this problem.  Patient had an ablation done in June to help control her pain however this was unfortunately unsuccessful.  Patient states that she has had continued menstrual pain and bleeding that lasts approximately 12 days each month.  States that she has been managed by her OB/GYN with hydrocodone for her pain until she can have her hysterectomy performed this December.  Patient denies new or unusual symptoms with her menstrual cramping today.  She states that this cycle started yesterday, describes it as a severe cramping pain in her lower abdomen with associated vaginal bleeding and that pain is constant.  Patient states that she took her last hydrocodone last night and is unable to follow-up with her OB/GYN until Monday for a refill since they are closed for the weekend.  Patient states that hydrocodone works very well for her pain.  Patient denies fever, concern for sexually transmitted diseases, nausea/vomiting, diarrhea, chest pain, shortness of breath, rash or lesions.  She states that this is her normal menstrual cycle pain.  HPI  Past Medical History:  Diagnosis Date  . Abnormal Pap smear    no cervical cancer, just h/o cryotherapy  . Arthritis   . Asthma    no episodes/no inhaler needed  . Endometriosis   . Fibroids   . Gout   . Heartburn during pregnancy   . Lactose intolerance     Patient Active Problem List   Diagnosis Date Noted  . Iron deficiency anemia due to chronic blood loss 10/07/2017  . DUB  (dysfunctional uterine bleeding) 10/07/2017  . Fatigue 10/07/2017  . History of colonic polyps 10/07/2017  . Migraine 08/23/2014  . S/P repeat low transverse C-section 02/09/2014    Past Surgical History:  Procedure Laterality Date  . CESAREAN SECTION N/A 09/20/2012   Procedure: PRIMARY CESAREAN SECTION;  Surgeon: Margarette Asal, MD;  Location: [facility];  Service: Obstetrics;  Laterality: N/A;  Primary edc 8/25  . CESAREAN SECTION WITH BILATERAL TUBAL LIGATION Bilateral 02/09/2014   Procedure: CESAREAN SECTION WITH BILATERAL TUBAL LIGATION;  Surgeon: Logan Bores, MD;  Location: [facility];  Service: Obstetrics;  Laterality: Bilateral;  . CRYOTHERAPY    . DIAGNOSTIC LAPAROSCOPY    . KNEE SURGERY    . ovarian fibroids       OB History    Gravida  3   Para  2   Term  2   Preterm      AB  1   Living  2     SAB  1   TAB      Ectopic      Multiple  0   Live Births  2            Home Medications    Prior to Admission medications   Medication Sig Start Date End Date Taking? Authorizing Provider  acetaminophen (TYLENOL) 500 MG tablet Take 1,000 mg by mouth every 6 (six) hours as needed for mild pain.   Yes  [provider]  FeFum-FePoly-FA-B Cmp-C-Biot (FOLIVANE-PLUS PO) Take 1 capsule by mouth daily.    Yes [provider]  ibuprofen (ADVIL,MOTRIN) 600 MG tablet Take 600 mg by mouth every 6 (six) hours as needed for cramping.    Yes [provider]  levothyroxine (SYNTHROID, LEVOTHROID) 25 MCG tablet Take 50 mcg by mouth daily before breakfast.    Yes [provider]  oxycodone-acetaminophen (PERCOCET) 2.5-325 MG tablet Take 1 tablet by mouth every 4 (four) hours as needed for pain.   Yes [provider]  tranexamic acid (LYSTEDA) 650 MG TABS tablet Take 1,300 mg by mouth 3 (three) times daily. For 5 days only on menstrual cycle.   Yes [provider]  Vitamin D, Ergocalciferol, (DRISDOL) 50000 units CAPS  capsule Take 50,000 Units by mouth every Monday.    Yes [provider]    Family History Family History  Problem Relation Age of Onset  . Diabetes Mother   . Hypertension Mother   . Hyperlipidemia Mother   . Heart disease Maternal Grandmother   . Alcohol abuse Neg Hx   . Arthritis Neg Hx   . Asthma Neg Hx   . Birth defects Neg Hx   . Cancer Neg Hx   . COPD Neg Hx   . Depression Neg Hx   . Drug abuse Neg Hx   . Early death Neg Hx   . Hearing loss Neg Hx   . Kidney disease Neg Hx   . Learning disabilities Neg Hx   . Mental illness Neg Hx   . Mental retardation Neg Hx   . Miscarriages / Stillbirths Neg Hx   . Stroke Neg Hx   . Vision loss Neg Hx   . Varicose Veins Neg Hx     Social History Social History   Tobacco Use  . Smoking status: Former Smoker    Last attempt to quit: 03/27/2002    Years since quitting: 15.6  . Smokeless tobacco: Never Used  Substance Use Topics  . Alcohol use: No  . Drug use: No     Allergies   Penicillins; Sulfa antibiotics; Aspirin; and Coconut flavor   Review of Systems Review of Systems  Constitutional: Negative.  Negative for chills and fever.  HENT: Negative.  Negative for congestion and rhinorrhea.   Eyes: Negative.  Negative for visual disturbance.  Respiratory: Negative.  Negative for shortness of breath.   Cardiovascular: Negative.  Negative for chest pain and leg swelling.  Gastrointestinal: Negative.  Negative for diarrhea, nausea and vomiting.  Genitourinary: Positive for menstrual problem and vaginal bleeding. Negative for dysuria, hematuria and vaginal discharge.  Musculoskeletal: Negative.  Negative for arthralgias and myalgias.  Skin: Negative.  Negative for rash.  Neurological: Negative.  Negative for dizziness, weakness and light-headedness.    Physical Exam Updated Vital Signs BP 126/84 (BP Location: Right Arm)   Pulse 68   Temp 99 F (37.2 C) (Oral)   Resp 16   Ht 5\' 7"  (1.702 m)   Wt 99.8 kg    SpO2 99%   BMI 34.46 kg/m   Physical Exam  Constitutional: She appears well-developed and well-nourished. No distress.  HENT:  Head: Normocephalic and atraumatic.  Right Ear: External ear normal.  Left Ear: External ear normal.  Nose: Nose normal.  Mouth/Throat: Uvula is midline and oropharynx is clear and moist.  Eyes: Pupils are equal, round, and reactive to light. EOM are normal.  Neck: Trachea normal, normal range of motion and phonation  normal. Neck supple. No tracheal deviation present.  Cardiovascular: Normal rate, regular rhythm, normal heart sounds and intact distal pulses.  Pulses:      Dorsalis pedis pulses are 2+ on the right side, and 2+ on the left side.       Posterior tibial pulses are 2+ on the right side, and 2+ on the left side.  Pulmonary/Chest: Effort normal and breath sounds normal. No respiratory distress. She exhibits no tenderness, no crepitus and no deformity.  Abdominal: Soft. Bowel sounds are normal. There is tenderness in the suprapubic area. There is no rigidity, no rebound, no guarding, no CVA tenderness, no tenderness at McBurney's point and negative Murphy's sign.  Well-healed midline surgical scar present.  Genitourinary:  Genitourinary Comments: Pelvic exam deferred by patient.  States she wishes to follow-up with her OB/GYN.  Musculoskeletal: Normal range of motion.       Right lower leg: Normal.       Left lower leg: Normal.  Feet:  Right Foot:  Protective Sensation: 3 sites tested. 3 sites sensed.  Left Foot:  Protective Sensation: 3 sites tested. 3 sites sensed.  Neurological: She is alert. No sensory deficit. GCS eye subscore is 4. GCS verbal subscore is 5. GCS motor subscore is 6.  Speech is clear and goal oriented, follows commands Major Cranial nerves without deficit, no facial droop Normal strength in upper and lower extremities bilaterally including dorsiflexion and plantar flexion, strong and equal grip strength Sensation normal to  light touch Moves extremities without ataxia, coordination intact Normal gait  Skin: Skin is warm and dry. Capillary refill takes less than 2 seconds.  Psychiatric: She has a normal mood and affect. Her behavior is normal.    ED Treatments / Results  Labs (all labs ordered are listed, but only abnormal results are displayed) Labs Reviewed  POC URINE PREG, ED    EKG None  Radiology No results found.  Procedures Procedures (including critical care time)  Medications Ordered in ED Medications  HYDROcodone-acetaminophen (NORCO/VICODIN) 5-325 MG per tablet 1 tablet (1 tablet Oral Given 11/14/17 1016)     Initial Impression / Assessment and Plan / ED Course  I have reviewed the triage vital signs and the nursing notes.  Pertinent labs & imaging results that were available during my care of the patient were reviewed by me and considered in my medical decision making (see chart for details).    42 year old female with history of dysmenorrhea presenting for narcotic medication refill.  Patient denies new or unusual symptoms today, states that this is her normal menstrual cycle pain.  Patient wishes to have her hydrocodone refilled until she can follow-up with her OB/GYN on Monday.  Patient informed that we do not by policy to refill narcotic prescriptions in the emergency department.  Patient has been given single dose of hydrocodone today. Patient is reasonable and states understanding that we do not prescribe narcotic refills in emergency department.  Patient discharged with conservative therapies including NSAIDs and heating packs.  Patient's pregnancy test today is negative.  Patient with nonacute abdomen, only mild tenderness to lower abdomen.  She states that she does not wish to have a pelvic examination today and that she wishes to follow-up with her OB/GYN on Monday.  Patient is alert and oriented x3 with mental capacity to make her own medical decisions.  Patient is afebrile,  not tachycardic, not hypotensive, well-appearing and in no acute distress.   At this time there does not appear to be  any evidence of an acute emergency medical condition and the patient appears stable for discharge with appropriate outpatient follow up. Diagnosis was discussed with patient who verbalizes understanding of care plan and is agreeable to discharge. I have discussed return precautions with patient who verbalizes understanding of return precautions. Patient strongly encouraged to follow-up with their PCP. All questions answered.  Patient's case discussed with Dr. Eulis Foster who agrees with plan to discharge with follow-up.     Note: Portions of this report may have been transcribed using voice recognition software. Every effort was made to ensure accuracy; however, inadvertent computerized transcription errors may still be present. Final Clinical Impressions(s) / ED Diagnoses   Final diagnoses:  Dysmenorrhea    ED Discharge Orders    None       Gari Crown 11/14/17 1728    Daleen Bo, MD 11/15/17 1613    Daleen Bo, MD 11/15/17 1615

## 2017-11-14 NOTE — ED Triage Notes (Signed)
Pt. Stated, I had an ablation in June 3 and Donnald Garre been in pain ever since. I took my last Hydrocodone last night. She needs to refill it. Im scheduled for a hysterectomy in December

## 2017-11-16 NOTE — Progress Notes (Signed)
Small hiatal hernia.

## 2017-12-21 NOTE — Patient Instructions (Addendum)
Your procedure is scheduled on:  Thursday, 12/5  Enter through the Main Entrance of [facility] at: 7:30 am  Pick up the phone at the desk and dial [phone].  Call this number if you have problems the morning of surgery: [phone].  Remember: Do NOT eat food or Do NOT drink clear liquids (including water) after midnight Wednesday.  Take these medicines the morning of surgery with a SIP OF WATER:  levothyroxine  Brush your teeth on the day of surgery.  Stop herbal medications, vitamin supplements, Ibuprofen/NSAIDS 1 week prior to surgery - Thursday 12/31/17.  Do NOT wear jewelry (body piercing), metal hair clips/bobby pins, make-up, or nail polish. Do NOT wear lotions, powders, or perfumes.  You may wear deoderant. Do NOT shave for 48 hours prior to surgery. Do NOT bring valuables to the hospital.  Leave suitcase in car.  After surgery it may be brought to your room.  For patients admitted to the hospital, checkout time is 11:00 AM the day of discharge. Have a responsible adult drive you home and stay with you for 24 hours after your procedure.  Home with Husband Reggie cell [phone]

## 2017-12-25 ENCOUNTER — Encounter (HOSPITAL_COMMUNITY): Payer: Self-pay

## 2017-12-25 ENCOUNTER — Encounter (HOSPITAL_COMMUNITY)
Admission: RE | Admit: 2017-12-25 | Discharge: 2017-12-25 | Disposition: A | Discharge status: Home / Self Care | Payer: BLUE CROSS/BLUE SHIELD | Source: Ambulatory Visit | Attending: Obstetrics and Gynecology | Admitting: Obstetrics and Gynecology

## 2017-12-25 ENCOUNTER — Other Ambulatory Visit: Payer: Self-pay

## 2017-12-25 DIAGNOSIS — Z01812 Encounter for preprocedural laboratory examination: Secondary | ICD-10-CM | POA: Diagnosis not present

## 2017-12-25 HISTORY — DX: Personal history of other diseases of the digestive system: Z87.19

## 2017-12-25 HISTORY — DX: Anemia, unspecified: D64.9

## 2017-12-25 HISTORY — DX: Headache, unspecified: R51.9

## 2017-12-25 HISTORY — DX: Headache: R51

## 2017-12-25 HISTORY — DX: Hypothyroidism, unspecified: E03.9

## 2017-12-25 LAB — TYPE AND SCREEN
ABO/RH(D): A POS
ANTIBODY SCREEN: NEGATIVE

## 2017-12-25 LAB — CBC
HCT: 35.8 % — ABNORMAL LOW (ref 36.0–46.0)
HEMOGLOBIN: 10.8 g/dL — AB (ref 12.0–15.0)
MCH: 24.4 pg — ABNORMAL LOW (ref 26.0–34.0)
MCHC: 30.2 g/dL (ref 30.0–36.0)
MCV: 81 fL (ref 80.0–100.0)
Platelets: 372 10*3/uL (ref 150–400)
RBC: 4.42 MIL/uL (ref 3.87–5.11)
RDW: 15.8 % — AB (ref 11.5–15.5)
WBC: 5.6 10*3/uL (ref 4.0–10.5)
nRBC: 0 % (ref 0.0–0.2)

## 2017-12-28 DIAGNOSIS — N945 Secondary dysmenorrhea: Secondary | ICD-10-CM | POA: Diagnosis not present

## 2017-12-28 DIAGNOSIS — Z01818 Encounter for other preprocedural examination: Secondary | ICD-10-CM | POA: Diagnosis not present

## 2017-12-28 DIAGNOSIS — N939 Abnormal uterine and vaginal bleeding, unspecified: Secondary | ICD-10-CM | POA: Diagnosis not present

## 2017-12-31 NOTE — H&P (Addendum)
Paula Cervantes is an 42 y.o. G44P2012 female with long history of dysmenorrhea, pelvic pain and DUB. Pt has been to the ER several times due to pain with menses. She has tried several medications including lysteda and narcotics with minimal relief. 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 Pt is opting for definitive management via surgical intervention. Risks, benefits and alternative treatment options have been reviewed. All questions answered  Pertinent Gynecological History: Menses: with severe dysmenorrhea Bleeding: dysfunctional uterine bleeding Contraception: tubal ligation DES exposure: denies Blood transfusions: none Sexually transmitted diseases: no past history Previous GYN Procedures: c/s x 2; D&C ablation Last mammogram: normal Date: 04/2017 Last pap: normal Date: 04/2017 OB History: G3, P2012   Menstrual History: Menarche age: 76 Patient's last menstrual period was 12/07/2017 (approximate).    Past Medical History:  Diagnosis Date  . Abnormal Pap smear    no cervical cancer, just h/o cryotherapy  . Anemia   . Arthritis    knees  . Asthma    hx as a child, no problems as adult, no inhaler  . Endometriosis   . Fibroids   . Gout    knees  . Headache   . Heartburn during pregnancy   . History of hiatal hernia   . Hypothyroidism   . Lactose intolerance     Past Surgical History:  Procedure Laterality Date  . CESAREAN SECTION N/A 09/20/2012   Procedure: PRIMARY CESAREAN SECTION;  Surgeon: Margarette Asal, MD;  Location: [facility];  Service: Obstetrics;  Laterality: N/A;  Primary edc 8/25  . CESAREAN SECTION WITH BILATERAL TUBAL LIGATION Bilateral 02/09/2014   Procedure: CESAREAN SECTION WITH BILATERAL TUBAL LIGATION;  Surgeon: Logan Bores, MD;  Location: [facility];  Service: Obstetrics;  Laterality: Bilateral;  . COLONOSCOPY     polyps  . CRYOTHERAPY    . DIAGNOSTIC  LAPAROSCOPY    . KNEE SURGERY Bilateral    x 3 arthroscopic knee  . ovarian fibroids    . TUBAL LIGATION  2016  . UPPER GI ENDOSCOPY    . WISDOM TOOTH EXTRACTION      Family History  Problem Relation Age of Onset  . Diabetes Mother   . Hypertension Mother   . Hyperlipidemia Mother   . Heart disease Maternal Grandmother   . Alcohol abuse Neg Hx   . Arthritis Neg Hx   . Asthma Neg Hx   . Birth defects Neg Hx   . Cancer Neg Hx   . COPD Neg Hx   . Depression Neg Hx   . Drug abuse Neg Hx   . Early death Neg Hx   . Hearing loss Neg Hx   . Kidney disease Neg Hx   . Learning disabilities Neg Hx   . Mental illness Neg Hx   . Mental retardation Neg Hx   . Miscarriages / Stillbirths Neg Hx   . Stroke Neg Hx   . Vision loss Neg Hx   . Varicose Veins Neg Hx     Social History:  reports that she quit smoking about 15 years ago. Her smoking use included cigarettes. She smoked 1.00 pack per day. She has never used smokeless tobacco. She reports that she does not drink alcohol or use drugs.  Allergies:  Allergies  Allergen Reactions  . Aspirin Hives and Nausea And Vomiting  . Coconut Flavor Hives  . Other Other (See Comments)  Hi Chew Candy- caused blisters inside mouth.  . Penicillins Nausea And Vomiting and Other (See Comments)    Passed out Has patient had a PCN reaction causing immediate rash, facial/tongue/throat swelling, SOB or lightheadedness with hypotension: unknown Has patient had a PCN reaction causing severe rash involving mucus membranes or skin necrosis: unknown Has patient had a PCN reaction that required hospitalization: unknown Has patient had a PCN reaction occurring within the last 10 years: no If all of the above answers are "NO", then may proceed with Cephalosporin use.   . Sulfa Antibiotics Other (See Comments)    Pt states that this medication causes blisters in mouth and throat.     No medications prior to admission.    Review of Systems   Constitutional: Positive for malaise/fatigue. Negative for chills, fever and weight loss.  Eyes: Negative for blurred vision.  Respiratory: Negative for shortness of breath.   Cardiovascular: Negative for chest pain.  Gastrointestinal: Positive for abdominal pain. Negative for heartburn, nausea and vomiting.  Genitourinary: Negative for dysuria.  Musculoskeletal: Positive for back pain.  Skin: Negative for itching and rash.  Neurological: Negative for dizziness and headaches.  Endo/Heme/Allergies: Does not bruise/bleed easily.  Psychiatric/Behavioral: Negative for depression, hallucinations, substance abuse and suicidal ideas. The patient is not nervous/anxious.     Last menstrual period 12/07/2017. Physical Exam  Constitutional: She is oriented to person, place, and time. She appears well-developed and well-nourished.  Neck: Normal range of motion.  Cardiovascular: Normal rate.  Respiratory: Effort normal.  GI: Soft. Bowel sounds are normal.  Genitourinary: Vagina normal.  Musculoskeletal: Normal range of motion.  Neurological: She is alert and oriented to person, place, and time.  Skin: Skin is warm and dry.  Psychiatric: She has a normal mood and affect. Her behavior is normal. Judgment and thought content normal.    No results found for this or any previous visit (from the past 24 hour(s)).  No results found.  Assessment/Plan: 42yo G3P2012 female with chronic dysmenorrhea resulting in need for narcotic use over a long period of time opting for definitive treatment via total laparoscopic hysterectomy, bilateral salpingectomy and cystoscopy with possible open Risks, benefits reviewed and all questions answered To OR when ready  Paula Cervantes 12/31/2017, 11:49 AM

## 2018-01-07 ENCOUNTER — Other Ambulatory Visit: Payer: Self-pay

## 2018-01-07 ENCOUNTER — Encounter (HOSPITAL_COMMUNITY)
Admission: AD | Disposition: A | Discharge status: Home / Self Care | Payer: Self-pay | Source: Ambulatory Visit | Attending: Obstetrics and Gynecology

## 2018-01-07 ENCOUNTER — Ambulatory Visit (HOSPITAL_COMMUNITY): Payer: BLUE CROSS/BLUE SHIELD | Admitting: Certified Registered Nurse Anesthetist

## 2018-01-07 ENCOUNTER — Observation Stay (HOSPITAL_COMMUNITY)
Admission: AD | Admit: 2018-01-07 | Discharge: 2018-01-09 | Disposition: A | Discharge status: Home / Self Care | Payer: BLUE CROSS/BLUE SHIELD | Source: Ambulatory Visit | Attending: Obstetrics and Gynecology | Admitting: Obstetrics and Gynecology

## 2018-01-07 ENCOUNTER — Encounter (HOSPITAL_COMMUNITY): Payer: Self-pay

## 2018-01-07 DIAGNOSIS — K660 Peritoneal adhesions (postprocedural) (postinfection): Secondary | ICD-10-CM | POA: Insufficient documentation

## 2018-01-07 DIAGNOSIS — N800 Endometriosis of uterus: Secondary | ICD-10-CM | POA: Diagnosis not present

## 2018-01-07 DIAGNOSIS — N920 Excessive and frequent menstruation with regular cycle: Secondary | ICD-10-CM | POA: Diagnosis not present

## 2018-01-07 DIAGNOSIS — N946 Dysmenorrhea, unspecified: Secondary | ICD-10-CM | POA: Diagnosis present

## 2018-01-07 DIAGNOSIS — Z882 Allergy status to sulfonamides status: Secondary | ICD-10-CM | POA: Insufficient documentation

## 2018-01-07 DIAGNOSIS — Z48816 Encounter for surgical aftercare following surgery on the genitourinary system: Secondary | ICD-10-CM | POA: Diagnosis not present

## 2018-01-07 DIAGNOSIS — R112 Nausea with vomiting, unspecified: Secondary | ICD-10-CM | POA: Diagnosis not present

## 2018-01-07 DIAGNOSIS — Z79890 Hormone replacement therapy (postmenopausal): Secondary | ICD-10-CM | POA: Diagnosis not present

## 2018-01-07 DIAGNOSIS — Z880 Allergy status to penicillin: Secondary | ICD-10-CM | POA: Insufficient documentation

## 2018-01-07 DIAGNOSIS — Z886 Allergy status to analgesic agent status: Secondary | ICD-10-CM | POA: Insufficient documentation

## 2018-01-07 DIAGNOSIS — E039 Hypothyroidism, unspecified: Secondary | ICD-10-CM | POA: Diagnosis not present

## 2018-01-07 DIAGNOSIS — N736 Female pelvic peritoneal adhesions (postinfective): Secondary | ICD-10-CM | POA: Diagnosis not present

## 2018-01-07 DIAGNOSIS — D259 Leiomyoma of uterus, unspecified: Secondary | ICD-10-CM | POA: Insufficient documentation

## 2018-01-07 DIAGNOSIS — J45909 Unspecified asthma, uncomplicated: Secondary | ICD-10-CM | POA: Diagnosis not present

## 2018-01-07 DIAGNOSIS — Z79899 Other long term (current) drug therapy: Secondary | ICD-10-CM | POA: Insufficient documentation

## 2018-01-07 DIAGNOSIS — Z87891 Personal history of nicotine dependence: Secondary | ICD-10-CM | POA: Insufficient documentation

## 2018-01-07 DIAGNOSIS — Z90710 Acquired absence of both cervix and uterus: Secondary | ICD-10-CM

## 2018-01-07 HISTORY — PX: TOTAL LAPAROSCOPIC HYSTERECTOMY WITH SALPINGECTOMY: SHX6742

## 2018-01-07 HISTORY — PX: CYSTOSCOPY: SHX5120

## 2018-01-07 LAB — PREGNANCY, URINE: Preg Test, Ur: NEGATIVE

## 2018-01-07 SURGERY — HYSTERECTOMY, TOTAL, LAPAROSCOPIC, WITH SALPINGECTOMY
Anesthesia: General

## 2018-01-07 MED ORDER — STERILE WATER FOR IRRIGATION IR SOLN
Status: DC | PRN
  Administered 2018-01-07: 1000 mL via INTRAVESICAL

## 2018-01-07 MED ORDER — DOCUSATE SODIUM 100 MG PO CAPS
100.0000 mg | ORAL_CAPSULE | Freq: Two times a day (BID) | ORAL | Status: DC
  Administered 2018-01-07 – 2018-01-08 (×3): 100 mg via ORAL
  Filled 2018-01-07 (×3): qty 1

## 2018-01-07 MED ORDER — LIDOCAINE HCL (CARDIAC) PF 100 MG/5ML IV SOSY
PREFILLED_SYRINGE | INTRAVENOUS | Status: AC
  Filled 2018-01-07: qty 5

## 2018-01-07 MED ORDER — OXYCODONE-ACETAMINOPHEN 5-325 MG PO TABS
2.0000 | ORAL_TABLET | ORAL | Status: DC | PRN
  Administered 2018-01-08 (×2): 2 via ORAL
  Filled 2018-01-07 (×3): qty 2

## 2018-01-07 MED ORDER — ONDANSETRON HCL 4 MG PO TABS: 4.0000 mg | ORAL_TABLET | Freq: Four times a day (QID) | ORAL | Status: DC | PRN

## 2018-01-07 MED ORDER — OXYCODONE HCL 5 MG PO TABS
ORAL_TABLET | ORAL | Status: AC
  Filled 2018-01-07: qty 1

## 2018-01-07 MED ORDER — OXYCODONE-ACETAMINOPHEN 5-325 MG PO TABS
1.0000 | ORAL_TABLET | ORAL | Status: DC | PRN
  Administered 2018-01-07: 1 via ORAL
  Filled 2018-01-07: qty 1

## 2018-01-07 MED ORDER — OXYCODONE HCL 5 MG PO TABS
5.0000 mg | ORAL_TABLET | Freq: Once | ORAL | Status: AC | PRN
  Administered 2018-01-07: 5 mg via ORAL

## 2018-01-07 MED ORDER — HYDROMORPHONE HCL 1 MG/ML IJ SOLN
0.2500 mg | INTRAMUSCULAR | Status: DC | PRN
  Administered 2018-01-07 (×2): 0.25 mg via INTRAVENOUS
  Administered 2018-01-07 (×3): 0.5 mg via INTRAVENOUS

## 2018-01-07 MED ORDER — GABAPENTIN 400 MG PO CAPS
400.0000 mg | ORAL_CAPSULE | Freq: Three times a day (TID) | ORAL | Status: DC
  Administered 2018-01-07 (×2): 400 mg via ORAL
  Filled 2018-01-07 (×3): qty 1

## 2018-01-07 MED ORDER — ONDANSETRON HCL 4 MG/2ML IJ SOLN
INTRAMUSCULAR | Status: DC | PRN
  Administered 2018-01-07 (×2): 4 mg via INTRAVENOUS

## 2018-01-07 MED ORDER — SCOPOLAMINE 1 MG/3DAYS TD PT72
MEDICATED_PATCH | TRANSDERMAL | Status: AC
  Administered 2018-01-07: 1.5 mg via TRANSDERMAL
  Filled 2018-01-07: qty 1

## 2018-01-07 MED ORDER — OXYCODONE-ACETAMINOPHEN 5-325 MG PO TABS: 1.0000 | ORAL_TABLET | ORAL | Status: DC | PRN

## 2018-01-07 MED ORDER — SOD CITRATE-CITRIC ACID 500-334 MG/5ML PO SOLN
ORAL | Status: AC
  Administered 2018-01-07: 30 mL via ORAL
  Filled 2018-01-07: qty 15

## 2018-01-07 MED ORDER — ROCURONIUM BROMIDE 100 MG/10ML IV SOLN
INTRAVENOUS | Status: DC | PRN
  Administered 2018-01-07 (×2): 10 mg via INTRAVENOUS
  Administered 2018-01-07: 50 mg via INTRAVENOUS

## 2018-01-07 MED ORDER — SODIUM CHLORIDE 0.9 % IR SOLN
Status: DC | PRN
  Administered 2018-01-07: 3000 mL

## 2018-01-07 MED ORDER — HYDROMORPHONE HCL 1 MG/ML IJ SOLN
INTRAMUSCULAR | Status: AC
  Administered 2018-01-07: 0.25 mg via INTRAVENOUS
  Filled 2018-01-07: qty 0.5

## 2018-01-07 MED ORDER — FENTANYL CITRATE (PF) 250 MCG/5ML IJ SOLN
INTRAMUSCULAR | Status: AC
  Filled 2018-01-07: qty 5

## 2018-01-07 MED ORDER — ONDANSETRON HCL 4 MG/2ML IJ SOLN
4.0000 mg | INTRAMUSCULAR | Status: DC | PRN
  Administered 2018-01-07 – 2018-01-08 (×3): 4 mg via INTRAVENOUS
  Filled 2018-01-07 (×3): qty 2

## 2018-01-07 MED ORDER — HYDROMORPHONE HCL 1 MG/ML IJ SOLN
INTRAMUSCULAR | Status: AC
  Filled 2018-01-07: qty 0.5

## 2018-01-07 MED ORDER — LACTATED RINGERS IV SOLN
INTRAVENOUS | Status: DC
  Administered 2018-01-07 – 2018-01-08 (×2): via INTRAVENOUS

## 2018-01-07 MED ORDER — IBUPROFEN 800 MG PO TABS
800.0000 mg | ORAL_TABLET | Freq: Three times a day (TID) | ORAL | Status: DC
  Administered 2018-01-07 – 2018-01-09 (×5): 800 mg via ORAL
  Filled 2018-01-07 (×6): qty 1

## 2018-01-07 MED ORDER — FENTANYL CITRATE (PF) 250 MCG/5ML IJ SOLN
INTRAMUSCULAR | Status: DC | PRN
  Administered 2018-01-07: 50 ug via INTRAVENOUS
  Administered 2018-01-07 (×2): 100 ug via INTRAVENOUS

## 2018-01-07 MED ORDER — KETOROLAC TROMETHAMINE 30 MG/ML IJ SOLN
INTRAMUSCULAR | Status: AC
  Filled 2018-01-07: qty 1

## 2018-01-07 MED ORDER — OXYCODONE-ACETAMINOPHEN 5-325 MG PO TABS
1.0000 | ORAL_TABLET | ORAL | Status: DC | PRN
  Filled 2018-01-07: qty 1

## 2018-01-07 MED ORDER — LIDOCAINE HCL (CARDIAC) PF 100 MG/5ML IV SOSY
PREFILLED_SYRINGE | INTRAVENOUS | Status: DC | PRN
  Administered 2018-01-07: 60 mg via INTRAVENOUS

## 2018-01-07 MED ORDER — MIDAZOLAM HCL 2 MG/2ML IJ SOLN
INTRAMUSCULAR | Status: AC
  Filled 2018-01-07: qty 2

## 2018-01-07 MED ORDER — SCOPOLAMINE 1 MG/3DAYS TD PT72
1.0000 | MEDICATED_PATCH | Freq: Once | TRANSDERMAL | Status: DC
  Administered 2018-01-07: 1.5 mg via TRANSDERMAL

## 2018-01-07 MED ORDER — BUPIVACAINE HCL (PF) 0.25 % IJ SOLN
INTRAMUSCULAR | Status: DC | PRN
  Administered 2018-01-07: 8 mL

## 2018-01-07 MED ORDER — LACTATED RINGERS IV SOLN
INTRAVENOUS | Status: DC
  Administered 2018-01-07: 09:00:00 via INTRAVENOUS

## 2018-01-07 MED ORDER — DEXAMETHASONE SODIUM PHOSPHATE 10 MG/ML IJ SOLN
INTRAMUSCULAR | Status: DC | PRN
  Administered 2018-01-07: 10 mg via INTRAVENOUS

## 2018-01-07 MED ORDER — LACTATED RINGERS IV SOLN
INTRAVENOUS | Status: DC
  Administered 2018-01-07: 125 mL/h via INTRAVENOUS

## 2018-01-07 MED ORDER — PROPOFOL 10 MG/ML IV BOLUS
INTRAVENOUS | Status: DC | PRN
  Administered 2018-01-07: 200 mg via INTRAVENOUS

## 2018-01-07 MED ORDER — MENTHOL 3 MG MT LOZG: 1.0000 | LOZENGE | OROMUCOSAL | Status: DC | PRN

## 2018-01-07 MED ORDER — SIMETHICONE 80 MG PO CHEW: 80.0000 mg | CHEWABLE_TABLET | Freq: Four times a day (QID) | ORAL | Status: DC | PRN

## 2018-01-07 MED ORDER — ONDANSETRON HCL 4 MG/2ML IJ SOLN
4.0000 mg | Freq: Four times a day (QID) | INTRAMUSCULAR | Status: DC | PRN
  Administered 2018-01-07: 4 mg via INTRAVENOUS
  Filled 2018-01-07: qty 2

## 2018-01-07 MED ORDER — CEFAZOLIN SODIUM-DEXTROSE 2-4 GM/100ML-% IV SOLN: 2.0000 g | INTRAVENOUS | Status: DC

## 2018-01-07 MED ORDER — ONDANSETRON HCL 4 MG/2ML IJ SOLN
INTRAMUSCULAR | Status: AC
  Filled 2018-01-07: qty 2

## 2018-01-07 MED ORDER — HYDROMORPHONE HCL 1 MG/ML IJ SOLN
INTRAMUSCULAR | Status: AC
  Administered 2018-01-07: 0.5 mg via INTRAVENOUS
  Filled 2018-01-07: qty 0.5

## 2018-01-07 MED ORDER — KETOROLAC TROMETHAMINE 30 MG/ML IJ SOLN
INTRAMUSCULAR | Status: DC | PRN
  Administered 2018-01-07: 30 mg via INTRAVENOUS

## 2018-01-07 MED ORDER — FLUORESCEIN SODIUM 10 % IV SOLN
INTRAVENOUS | Status: DC | PRN
  Administered 2018-01-07: 1 mL via INTRAVENOUS

## 2018-01-07 MED ORDER — SUGAMMADEX SODIUM 500 MG/5ML IV SOLN
INTRAVENOUS | Status: AC
  Filled 2018-01-07: qty 5

## 2018-01-07 MED ORDER — SUGAMMADEX SODIUM 500 MG/5ML IV SOLN
INTRAVENOUS | Status: DC | PRN
  Administered 2018-01-07: 300 mg via INTRAVENOUS

## 2018-01-07 MED ORDER — SOD CITRATE-CITRIC ACID 500-334 MG/5ML PO SOLN
30.0000 mL | ORAL | Status: AC
  Administered 2018-01-07: 30 mL via ORAL

## 2018-01-07 MED ORDER — HYDROMORPHONE HCL 1 MG/ML IJ SOLN
1.0000 mg | Freq: Once | INTRAMUSCULAR | Status: AC
  Administered 2018-01-07: 1 mg via INTRAVENOUS
  Filled 2018-01-07: qty 1

## 2018-01-07 MED ORDER — MEPERIDINE HCL 25 MG/ML IJ SOLN: 6.2500 mg | INTRAMUSCULAR | Status: DC | PRN

## 2018-01-07 MED ORDER — MIDAZOLAM HCL 2 MG/2ML IJ SOLN
INTRAMUSCULAR | Status: DC | PRN
  Administered 2018-01-07: 2 mg via INTRAVENOUS

## 2018-01-07 MED ORDER — HYDROCODONE-ACETAMINOPHEN 5-325 MG PO TABS: 1.0000 | ORAL_TABLET | ORAL | Status: DC | PRN

## 2018-01-07 MED ORDER — KETOROLAC TROMETHAMINE 30 MG/ML IJ SOLN: 30.0000 mg | Freq: Once | INTRAMUSCULAR | Status: DC

## 2018-01-07 MED ORDER — CLINDAMYCIN PHOSPHATE 900 MG/50ML IV SOLN
900.0000 mg | Freq: Once | INTRAVENOUS | Status: AC
  Administered 2018-01-07: 900 mg via INTRAVENOUS
  Filled 2018-01-07: qty 50

## 2018-01-07 MED ORDER — BUPIVACAINE HCL (PF) 0.25 % IJ SOLN
INTRAMUSCULAR | Status: AC
  Filled 2018-01-07: qty 30

## 2018-01-07 MED ORDER — OXYCODONE HCL 5 MG/5ML PO SOLN: 5.0000 mg | Freq: Once | ORAL | Status: AC | PRN

## 2018-01-07 MED ORDER — PANTOPRAZOLE SODIUM 40 MG PO TBEC
40.0000 mg | DELAYED_RELEASE_TABLET | Freq: Every day | ORAL | Status: DC
  Administered 2018-01-08: 40 mg via ORAL
  Filled 2018-01-07: qty 1

## 2018-01-07 MED ORDER — PROMETHAZINE HCL 25 MG/ML IJ SOLN: 6.2500 mg | INTRAMUSCULAR | Status: DC | PRN

## 2018-01-07 MED ORDER — FLUORESCEIN SODIUM 10 % IV SOLN
INTRAVENOUS | Status: AC
  Filled 2018-01-07: qty 5

## 2018-01-07 SURGICAL SUPPLY — 43 items
ADH SKN CLS APL DERMABOND .7 (GAUZE/BANDAGES/DRESSINGS) ×2
BARRIER ADHS 3X4 INTERCEED (GAUZE/BANDAGES/DRESSINGS) IMPLANT
BRR ADH 4X3 ABS CNTRL BYND (GAUZE/BANDAGES/DRESSINGS)
CABLE HIGH FREQUENCY MONO STRZ (ELECTRODE) IMPLANT
COVER MAYO STAND STRL (DRAPES) ×3 IMPLANT
DERMABOND ADVANCED (GAUZE/BANDAGES/DRESSINGS) ×1
DERMABOND ADVANCED .7 DNX12 (GAUZE/BANDAGES/DRESSINGS) ×2 IMPLANT
DEVICE SUTURE ENDOST 10MM (ENDOMECHANICALS) ×3 IMPLANT
DURAPREP 26ML APPLICATOR (WOUND CARE) ×3 IMPLANT
FILTER SMOKE EVAC LAPAROSHD (FILTER) ×3 IMPLANT
GLOVE BIO SURGEON STRL SZ 6.5 (GLOVE) ×6 IMPLANT
GLOVE BIOGEL PI IND STRL 7.0 (GLOVE) ×6 IMPLANT
GLOVE BIOGEL PI INDICATOR 7.0 (GLOVE) ×3
GOWN STRL REUS W/TWL LRG LVL3 (GOWN DISPOSABLE) ×9 IMPLANT
MANIPULATOR UTERINE 7CM CLEARV (MISCELLANEOUS) IMPLANT
NS IRRIG 1000ML POUR BTL (IV SOLUTION) ×3 IMPLANT
OCCLUDER COLPOPNEUMO (BALLOONS) ×3 IMPLANT
PACK LAVH (CUSTOM PROCEDURE TRAY) ×3 IMPLANT
PACK TRENDGUARD 450 HYBRID PRO (MISCELLANEOUS) IMPLANT
PROTECTOR NERVE ULNAR (MISCELLANEOUS) ×6 IMPLANT
SCISSORS LAP 5X35 DISP (ENDOMECHANICALS) IMPLANT
SET CYSTO W/LG BORE CLAMP LF (SET/KITS/TRAYS/PACK) ×3 IMPLANT
SET IRRIG TUBING LAPAROSCOPIC (IRRIGATION / IRRIGATOR) ×3 IMPLANT
SET TRI-LUMEN FLTR TB AIRSEAL (TUBING) ×3 IMPLANT
SHEARS HARMONIC ACE PLUS 36CM (ENDOMECHANICALS) ×1 IMPLANT
SLEEVE XCEL OPT CAN 5 100 (ENDOMECHANICALS) ×3 IMPLANT
SUT ENDO VLOC 180-0-8IN (SUTURE) ×3 IMPLANT
SUT VIC AB 0 CT1 27 (SUTURE) ×6
SUT VIC AB 0 CT1 27XBRD ANBCTR (SUTURE) ×4 IMPLANT
SUT VICRYL 0 UR6 27IN ABS (SUTURE) ×3 IMPLANT
SUT VICRYL 4-0 PS2 18IN ABS (SUTURE) ×3 IMPLANT
SYR 10ML LL (SYRINGE) ×3 IMPLANT
SYR 50ML LL SCALE MARK (SYRINGE) ×3 IMPLANT
SYSTEM CARTER THOMASON II (TROCAR) ×3 IMPLANT
TIP UTERINE 6.7X8CM BLUE DISP (MISCELLANEOUS) ×1 IMPLANT
TOWEL OR 17X24 6PK STRL BLUE (TOWEL DISPOSABLE) ×6 IMPLANT
TRAY FOLEY W/BAG SLVR 14FR (SET/KITS/TRAYS/PACK) ×3 IMPLANT
TRENDGUARD 450 HYBRID PRO PACK (MISCELLANEOUS) ×3
TROCAR PORT AIRSEAL 5X120 (TROCAR) ×1 IMPLANT
TROCAR XCEL NON-BLD 11X100MML (ENDOMECHANICALS) ×3 IMPLANT
TROCAR XCEL NON-BLD 5MMX100MML (ENDOMECHANICALS) ×3 IMPLANT
TUBING INSUF HEATED (TUBING) ×3 IMPLANT
WARMER LAPAROSCOPE (MISCELLANEOUS) ×3 IMPLANT

## 2018-01-07 NOTE — Plan of Care (Signed)
  Problem: Clinical Measurements: Goal: Ability to maintain clinical measurements within normal limits will improve Outcome: Progressing   Problem: Education: Goal: Knowledge of the prescribed therapeutic regimen will improve Outcome: Progressing   Problem: Skin Integrity: Goal: Demonstration of wound healing without infection will improve Outcome: Progressing

## 2018-01-07 NOTE — Op Note (Signed)
Operative Note    Preoperative Diagnosis 1. Dysmenorrhea 2.  Menorrhagia  Postoperative Diagnosis Same 3. Adhesion of omentum to anterior abdominal wall  Procedure: Total laparoscopic hysterectomy with bilateral salpingectomy and Lysis of adhesion   Surgeon: Mickle Mallory DO Assist: Meisinger, T MD  Anesthesia: General  Fluids:LR 1449ml EBL: 2ml UOP: 279ml   Findings: Grossly normal uterus, tubes and ovaries ( mildly adhesed on left) ; Adhesion of omentum to anterior abdominal wall   Specimen: Uterus, bilateral fallopian tubes and cervix   Procedure Note Pt seen in pre-op. Procedure reviewed and all questions answered; consent verified  Pt taken to operating room and placed in dorsal lithotomy position with her arms safely positioned at her sides. General anesthesia was administered and found to be adequate. Pt was prepped and draped in sterile fashion and a timeout performed. A weighted speculum was placed in the posterior fornix and a sim retractor placed anteriorly. Excellent visualization of the cervix was obtained. Uterus was sounded to 10.5cm so a size 10 koh was assembled with a medium cup and placed with retention sutures at 3 and 9 o'clock. The vaginal occluder was filled with 60cc of saline. A foley catheter was also placed in a sterile fashion Legs were then lowered and attention turned to her abdomen.  Here a 37mm incision was made at the umbilicus. A 88mm optiview trocar was then placed with the abdomen tented upwards. The laparoscopic camera was used to confirm placement and pneumoperitoneum obtained with CO2 gas to 36mmHg. The patient was placed in trendelenberg and gross survey of pelvis done.The uterus was noted as described above:  At this time one more 17mm port was then placed under direct visualization in right lower quadrant and an 11 site in the left with care taken to avoid the epigastric vessels. Further exploration noted. A gas evacuator was  attached  Starting on the the patients left, the left fallopian tube was then grasped with a blunt grasper, elevated and excised using the harmonic hemostatically after adhesion to ovary excised. Next the utero-ovarian ligament and the round ligaments were sequentially grasped and excised. The broad ligament was then separated from the uterus as well with a bladder flap created. The cardinal ligament was then excised next at the utero-cervical junction and the ovarian vessel clamped, cauterized and cut. The same was done on the patients right with similar results.  Next the bladder reflection was dissected away. There was mild scarring likely from prior c/s x 2. Starting anteriorly and working laterally the uterus and cervix were amputated off the superior vagina. The uterus, cervix and fallopian tubes were removed vaginally The pelvis was irrigated and hemostasis noted. The angles of the vaginal cuff were easily seen and using the endostitch with an 0 vicryl v-lock suture, the cuff was closed in a running fashion. Further irrigation of the pelvis confirmed no abnormalities or bleeding hence patient was flattened. The 11 port site was closed with 0-vicryl suture with a carter thomasen to ensure closure of the peritoneum.  The remaining  trocars were removed under direct visualization and gas allowed to escape. A cystoscope was then performed with a 70 degree scope after florescein given. Clear bilateral ureteral jets were noted and bladder dome and posterior wall intact. The foley catheter was then replaced.   Incision sites were closed next with 4-0 vicryl suture and dermabond. Counts were noted to be correct.  Patient was awakened and taken to recovery room in stable status.

## 2018-01-07 NOTE — Anesthesia Procedure Notes (Signed)
Procedure Name: Intubation Date/Time: 01/07/2018 9:02 AM Performed by: [place] Jozsef Wescoat C, CRNA Pre-anesthesia Checklist: Patient identified, Emergency Drugs available, Suction available and Patient being monitored Patient Re-evaluated:Patient Re-evaluated prior to induction Oxygen Delivery Method: Circle system utilized Preoxygenation: Pre-oxygenation with 100% oxygen Induction Type: IV induction Ventilation: Mask ventilation without difficulty Laryngoscope Size: Mac and 3 Grade View: Grade II Tube type: Oral Tube size: 7.0 mm Number of attempts: 1 Airway Equipment and Method: Stylet and Oral airway Placement Confirmation: ETT inserted through vocal cords under direct vision,  positive ETCO2 and breath sounds checked- equal and bilateral Secured at: 21 cm Tube secured with: Tape Dental Injury: Teeth and Oropharynx as per pre-operative assessment

## 2018-01-07 NOTE — Transfer of Care (Signed)
Immediate Anesthesia Transfer of Care Note  Patient: Paula Cervantes  Procedure(s) Performed: TOTAL LAPAROSCOPIC HYSTERECTOMY WITH SALPINGECTOMY (Bilateral ) CYSTOSCOPY (N/A )  Patient Location: PACU  Anesthesia Type:General  Level of Consciousness: awake, alert  and drowsy  Airway & Oxygen Therapy: Patient Spontanous Breathing and Patient connected to nasal cannula oxygen  Post-op Assessment: Report given to RN and Post -op Vital signs reviewed and stable  Post vital signs: Reviewed and stable  Last Vitals:  Vitals Value Taken Time  BP 138/71 01/07/2018 11:15 AM  Temp    Pulse 86 01/07/2018 11:25 AM  Resp 17 01/07/2018 11:25 AM  SpO2 97 % 01/07/2018 11:25 AM  Vitals shown include unvalidated device data.  Last Pain:  Vitals:   01/07/18 0745  TempSrc: Oral  PainSc: 0-No pain      Patients Stated Pain Goal: 3 (91/50/56 9794)  Complications: No apparent anesthesia complications

## 2018-01-07 NOTE — Anesthesia Postprocedure Evaluation (Signed)
Anesthesia Post Note  Patient: Paula Cervantes  Procedure(s) Performed: TOTAL LAPAROSCOPIC HYSTERECTOMY WITH SALPINGECTOMY (Bilateral ) CYSTOSCOPY (N/A )     Patient location during evaluation: PACU Anesthesia Type: General Level of consciousness: awake and alert Pain management: pain level controlled Vital Signs Assessment: post-procedure vital signs reviewed and stable Respiratory status: spontaneous breathing, nonlabored ventilation and respiratory function stable Cardiovascular status: blood pressure returned to baseline and stable Postop Assessment: no apparent nausea or vomiting Anesthetic complications: no    Last Vitals:  Vitals:   01/07/18 1200 01/07/18 1215  BP: 135/73 (!) 143/79  Pulse: 87 87  Resp: 16 15  Temp:    SpO2: 95% 96%    Last Pain:  Vitals:   01/07/18 1225  TempSrc:   PainSc: Asleep   Pain Goal: Patients Stated Pain Goal: 3 (01/07/18 1219)               Lynda Rainwater

## 2018-01-07 NOTE — Interval H&P Note (Signed)
History and Physical Interval Note: Reviewed procedure and expectations No change from H/P Due to pt allergy will change from PCN to clindamycin for preop antibiotic  01/07/2018 8:22 AM  Paula Cervantes  has presented today for surgery, with the diagnosis of menorrhagia, dysmenorrhea  The various methods of treatment have been discussed with the patient and family. After consideration of risks, benefits and other options for treatment, the patient has consented to  Procedure(s) with comments: TOTAL LAPAROSCOPIC HYSTERECTOMY WITH SALPINGECTOMY, POSSIBLE OPEN (Bilateral) - Possible Open CYSTOSCOPY (N/A) as a surgical intervention .  The patient's history has been reviewed, patient examined, no change in status, stable for surgery.  I have reviewed the patient's chart and labs.  Questions were answered to the patient's satisfaction.     Isaiah Serge

## 2018-01-07 NOTE — Progress Notes (Addendum)
Patient ID: Paula Cervantes, female   DOB: 1975-11-13, 42 y.o.   MRN: [id number] Pt reports crampy pain not well relieved with percocet. She has been sleeping on/off all afternoon per self and nursing.  VS 135 - 142/69-81 ABD - soft , mild tenderness as expected EXT - scds in place  A/P POD#0 s/p TLH/BS, cysto - stable        Increase percocet to 10mg  for next dose         Discussed need for balance between pain control and not getting too somnolent.          Routine post op care

## 2018-01-07 NOTE — Anesthesia Preprocedure Evaluation (Signed)
Anesthesia Evaluation  Patient identified by MRN, date of birth, ID band Patient awake    Reviewed: Allergy & Precautions, H&P , NPO status , Patient's Chart, lab work & pertinent test results  Airway Mallampati: II  TM Distance: >3 FB Neck ROM: full    Dental no notable dental hx.    Pulmonary asthma , former smoker,    Pulmonary exam normal breath sounds clear to auscultation       Cardiovascular negative cardio ROS Normal cardiovascular exam Rhythm:Regular Rate:Normal     Neuro/Psych  Headaches, negative psych ROS   GI/Hepatic negative GI ROS, Neg liver ROS,   Endo/Other  negative endocrine ROS  Renal/GU negative Renal ROS     Musculoskeletal   Abdominal (+) + obese,   Peds  Hematology negative hematology ROS (+) anemia ,   Anesthesia Other Findings   Reproductive/Obstetrics                             Anesthesia Physical  Anesthesia Plan  ASA: II  Anesthesia Plan: General   Post-op Pain Management:    Induction: Intravenous  PONV Risk Score and Plan: 3 and Ondansetron, Dexamethasone and Midazolam  Airway Management Planned: Oral ETT  Additional Equipment:   Intra-op Plan:   Post-operative Plan: Extubation in OR  Informed Consent: I have reviewed the patients History and Physical, chart, labs and discussed the procedure including the risks, benefits and alternatives for the proposed anesthesia with the patient or authorized representative who has indicated his/her understanding and acceptance.   Dental advisory given  Plan Discussed with: CRNA, Anesthesiologist and Surgeon  Anesthesia Plan Comments:         Anesthesia Quick Evaluation

## 2018-01-08 ENCOUNTER — Encounter (HOSPITAL_COMMUNITY): Payer: Self-pay | Admitting: Obstetrics and Gynecology

## 2018-01-08 DIAGNOSIS — K660 Peritoneal adhesions (postprocedural) (postinfection): Secondary | ICD-10-CM | POA: Diagnosis not present

## 2018-01-08 DIAGNOSIS — N920 Excessive and frequent menstruation with regular cycle: Secondary | ICD-10-CM | POA: Diagnosis not present

## 2018-01-08 DIAGNOSIS — J45909 Unspecified asthma, uncomplicated: Secondary | ICD-10-CM | POA: Diagnosis not present

## 2018-01-08 DIAGNOSIS — Z882 Allergy status to sulfonamides status: Secondary | ICD-10-CM | POA: Diagnosis not present

## 2018-01-08 DIAGNOSIS — Z886 Allergy status to analgesic agent status: Secondary | ICD-10-CM | POA: Diagnosis not present

## 2018-01-08 DIAGNOSIS — Z880 Allergy status to penicillin: Secondary | ICD-10-CM | POA: Diagnosis not present

## 2018-01-08 DIAGNOSIS — Z79890 Hormone replacement therapy (postmenopausal): Secondary | ICD-10-CM | POA: Diagnosis not present

## 2018-01-08 DIAGNOSIS — E039 Hypothyroidism, unspecified: Secondary | ICD-10-CM | POA: Diagnosis not present

## 2018-01-08 DIAGNOSIS — N800 Endometriosis of uterus: Secondary | ICD-10-CM | POA: Diagnosis not present

## 2018-01-08 DIAGNOSIS — N946 Dysmenorrhea, unspecified: Secondary | ICD-10-CM | POA: Diagnosis not present

## 2018-01-08 DIAGNOSIS — D259 Leiomyoma of uterus, unspecified: Secondary | ICD-10-CM | POA: Diagnosis not present

## 2018-01-08 DIAGNOSIS — R112 Nausea with vomiting, unspecified: Secondary | ICD-10-CM | POA: Diagnosis not present

## 2018-01-08 DIAGNOSIS — Z79899 Other long term (current) drug therapy: Secondary | ICD-10-CM | POA: Diagnosis not present

## 2018-01-08 DIAGNOSIS — Z87891 Personal history of nicotine dependence: Secondary | ICD-10-CM | POA: Diagnosis not present

## 2018-01-08 LAB — CBC
HCT: 32.1 % — ABNORMAL LOW (ref 36.0–46.0)
Hemoglobin: 9.7 g/dL — ABNORMAL LOW (ref 12.0–15.0)
MCH: 24.6 pg — ABNORMAL LOW (ref 26.0–34.0)
MCHC: 30.2 g/dL (ref 30.0–36.0)
MCV: 81.5 fL (ref 80.0–100.0)
Platelets: 375 10*3/uL (ref 150–400)
RBC: 3.94 MIL/uL (ref 3.87–5.11)
RDW: 15.6 % — ABNORMAL HIGH (ref 11.5–15.5)
WBC: 10.8 10*3/uL — ABNORMAL HIGH (ref 4.0–10.5)
nRBC: 0 % (ref 0.0–0.2)

## 2018-01-08 MED ORDER — ZOLPIDEM TARTRATE 5 MG PO TABS
5.0000 mg | ORAL_TABLET | Freq: Once | ORAL | Status: AC
  Administered 2018-01-08: 5 mg via ORAL
  Filled 2018-01-08: qty 1

## 2018-01-08 MED ORDER — HYDROMORPHONE HCL 2 MG PO TABS
2.0000 mg | ORAL_TABLET | ORAL | Status: DC | PRN
  Administered 2018-01-08 – 2018-01-09 (×4): 2 mg via ORAL
  Filled 2018-01-08 (×4): qty 1

## 2018-01-08 MED ORDER — SCOPOLAMINE 1 MG/3DAYS TD PT72
1.0000 | MEDICATED_PATCH | TRANSDERMAL | Status: DC
  Administered 2018-01-08: 1.5 mg via TRANSDERMAL
  Filled 2018-01-08: qty 1

## 2018-01-08 MED ORDER — PROMETHAZINE HCL 25 MG PO TABS
12.5000 mg | ORAL_TABLET | ORAL | Status: DC | PRN
  Administered 2018-01-08 – 2018-01-09 (×2): 12.5 mg via ORAL
  Filled 2018-01-08 (×2): qty 1

## 2018-01-08 MED ORDER — PROMETHAZINE HCL 25 MG PO TABS: 25.0000 mg | ORAL_TABLET | Freq: Once | ORAL | Status: AC

## 2018-01-08 MED ORDER — HYDROMORPHONE HCL 1 MG/ML IJ SOLN
1.0000 mg | Freq: Once | INTRAMUSCULAR | Status: AC
  Administered 2018-01-08: 1 mg via INTRAVENOUS
  Filled 2018-01-08: qty 1

## 2018-01-08 MED ORDER — PROMETHAZINE HCL 25 MG/ML IJ SOLN
12.5000 mg | Freq: Once | INTRAMUSCULAR | Status: AC
  Administered 2018-01-08: 12.5 mg via INTRAVENOUS
  Filled 2018-01-08: qty 1

## 2018-01-08 NOTE — Progress Notes (Signed)
Patient ID: Paula Cervantes, female   DOB: [birth year], 42 y.o.   MRN: [id number] Pt reports nausea and vomiting all night. Feels tired. Pain better controlled than immediately post op - worse after vomiting. She has voided this am after foley removed. She denies fever, chills. She is trying to eat breakfast now.  VSS ABD - soft, mild distension as expected, dressings c/d/i EXT - SCDs in place  10.8>9.7<375  A/P: POD#1 s/p TLH/BS, cystoscopy having nausea/vomiting      Change from zofran to phenergan; new scopolamine patch      Monitor diet and pain control      Delay discharge to home at this time

## 2018-01-08 NOTE — Progress Notes (Signed)
Patient ID: Paula Cervantes, female   DOB: Mar 21, 1975, 42 y.o.   MRN: [id number] Pt reports nausea has improved with phenergan over zofran. Has had some nausea but no vomiting today. She also reports better pain tolerance with dilaudid over percocet. She is tired however from lack of sleep overnight and day before surgery. Ambulating to bathroom. Will attempt to eat lunch VSS  GEN - NAD  A/P: POD#1 - continue to monitor pain          Reassess prn- monitor diet and ambulation          Continue routine post op care

## 2018-01-08 NOTE — Progress Notes (Signed)
Pt. With pain complaint post dilaudid, Dr. Terri Piedra called. Ibuprofen given early and heat applied to back for pain. Per Dr. Terri Piedra, no stronger pain meds, no IV.  Pt. Aware of POC. Will get ambien to promote rest.

## 2018-01-08 NOTE — Progress Notes (Signed)
Patient ID: Paula Cervantes, female   DOB: 1975-10-20, 42 y.o.   MRN: [id number] Pt reports pain better controlled with dilaudid and nausea is better controlled with phenergan. Has ambulated the hallways twice today. Tired. VSS ABD - Soft, ND EXT - no homans  A/P: POD#1 s/p tlh/bs - recovering gradually         Will monitor overnight and likely d/c home in am

## 2018-01-09 DIAGNOSIS — D259 Leiomyoma of uterus, unspecified: Secondary | ICD-10-CM | POA: Diagnosis not present

## 2018-01-09 DIAGNOSIS — Z882 Allergy status to sulfonamides status: Secondary | ICD-10-CM | POA: Diagnosis not present

## 2018-01-09 DIAGNOSIS — K660 Peritoneal adhesions (postprocedural) (postinfection): Secondary | ICD-10-CM | POA: Diagnosis not present

## 2018-01-09 DIAGNOSIS — Z886 Allergy status to analgesic agent status: Secondary | ICD-10-CM | POA: Diagnosis not present

## 2018-01-09 DIAGNOSIS — Z79899 Other long term (current) drug therapy: Secondary | ICD-10-CM | POA: Diagnosis not present

## 2018-01-09 DIAGNOSIS — R112 Nausea with vomiting, unspecified: Secondary | ICD-10-CM | POA: Diagnosis not present

## 2018-01-09 DIAGNOSIS — Z880 Allergy status to penicillin: Secondary | ICD-10-CM | POA: Diagnosis not present

## 2018-01-09 DIAGNOSIS — Z79890 Hormone replacement therapy (postmenopausal): Secondary | ICD-10-CM | POA: Diagnosis not present

## 2018-01-09 DIAGNOSIS — J45909 Unspecified asthma, uncomplicated: Secondary | ICD-10-CM | POA: Diagnosis not present

## 2018-01-09 DIAGNOSIS — N800 Endometriosis of uterus: Secondary | ICD-10-CM | POA: Diagnosis not present

## 2018-01-09 DIAGNOSIS — E039 Hypothyroidism, unspecified: Secondary | ICD-10-CM | POA: Diagnosis not present

## 2018-01-09 DIAGNOSIS — N946 Dysmenorrhea, unspecified: Secondary | ICD-10-CM | POA: Diagnosis not present

## 2018-01-09 DIAGNOSIS — N920 Excessive and frequent menstruation with regular cycle: Secondary | ICD-10-CM | POA: Diagnosis not present

## 2018-01-09 DIAGNOSIS — Z87891 Personal history of nicotine dependence: Secondary | ICD-10-CM | POA: Diagnosis not present

## 2018-01-09 MED ORDER — IBUPROFEN 600 MG PO TABS: 600.0000 mg | ORAL_TABLET | Freq: Four times a day (QID) | ORAL | 1 refills | Status: DC | PRN

## 2018-01-09 MED ORDER — OXYCODONE-ACETAMINOPHEN 5-325 MG PO TABS: 1.0000 | ORAL_TABLET | ORAL | 0 refills | Status: AC | PRN

## 2018-01-09 MED ORDER — PROMETHAZINE HCL 12.5 MG PO TABS: 12.5000 mg | ORAL_TABLET | ORAL | 0 refills | Status: DC | PRN

## 2018-01-09 NOTE — Discharge Summary (Signed)
Physician Discharge Summary  Patient ID: Paula Cervantes MRN: [id number] DOB/AGE: 1976/01/31 42 y.o.  Admit date: 01/07/2018 Discharge date: 01/09/2018  Admission [facility] and menorrhagia  Discharge Diagnoses:  Active Problems:   Dysmenorrhea   Status post laparoscopic hysterectomy with bilateral salpingectomy  Discharged Condition: stable  Hospital Course: Pt had an uncomplicated total laparoscopic hysterectomy with bilateral salpingectomy. During extended recovery she developed nausea and vomiting with affected her post op pain control. This was effectively controlled with phenergan. Pain better resolved on day two post surgery and pt able to tolerate diet well. Pt deemed stable to be discharged to home today  Consults: None  Significant Diagnostic Studies: labs: cbc - wnl  Treatments: IV hydration, analgesia: acetaminophen w/ codeine and Dilaudid and phenergan  Discharge Exam: Blood pressure 129/74, pulse 87, temperature 98.6 F (37 C), temperature source Oral, resp. rate 17, height 5\' 6"  (1.676 m), weight 104.3 kg, SpO2 98 %. General appearance: alert, cooperative and no distress GI: soft, non-tender; bowel sounds normal; no masses,  no organomegaly Extremities: extremities normal, atraumatic, no cyanosis or edema  Disposition: Discharge disposition: 01-Home or Self Care       Discharge Instructions    Call MD for:  persistant dizziness or light-headedness   Complete by:  As directed    Call MD for:  persistant nausea and vomiting   Complete by:  As directed    Call MD for:  redness, tenderness, or signs of infection (pain, swelling, redness, odor or green/yellow discharge around incision site)   Complete by:  As directed    Call MD for:  severe uncontrolled pain   Complete by:  As directed    Call MD for:  temperature >100.4   Complete by:  As directed    Diet - low sodium heart healthy   Complete by:  As directed    Discharge instructions   Complete  by:  As directed    Call with any complaints [phone]   Driving Restrictions   Complete by:  As directed    None while taking narcotic pain medication   Increase activity slowly   Complete by:  As directed    Lifting restrictions   Complete by:  As directed    Lift <15lbs   Sexual Activity Restrictions   Complete by:  As directed    None for 6 weeks     Allergies as of 01/09/2018      Reactions   Aspirin Hives, Nausea And Vomiting   Coconut Flavor Hives   Other Other (See Comments)   Hi Chew Candy- caused blisters inside mouth.   Penicillins Nausea And Vomiting, Other (See Comments)   Passed out Has patient had a PCN reaction causing immediate rash, facial/tongue/throat swelling, SOB or lightheadedness with hypotension: unknown Has patient had a PCN reaction causing severe rash involving mucus membranes or skin necrosis: unknown Has patient had a PCN reaction that required hospitalization: unknown Has patient had a PCN reaction occurring within the last 10 years: no If all of the above answers are "NO", then may proceed with Cephalosporin use.   Sulfa Antibiotics Other (See Comments)   Pt states that this medication causes blisters in mouth and throat.       Medication List    STOP taking these medications   tranexamic acid 650 MG Tabs tablet Commonly known as:  LYSTEDA     TAKE these medications   acetaminophen 500 MG tablet Commonly known as:  TYLENOL Take 1,000 mg  by mouth every 8 (eight) hours as needed for moderate pain.   butalbital-acetaminophen-caffeine 50-325-40 MG tablet Commonly known as:  FIORICET, ESGIC Take 1 tablet by mouth every 6 (six) hours as needed for migraine.   FOLIVANE-PLUS Caps Take 1 capsule by mouth daily.   ibuprofen 600 MG tablet Commonly known as:  ADVIL,MOTRIN Take 1 tablet (600 mg total) by mouth every 6 (six) hours as needed for moderate pain or cramping. What changed:  reasons to take this   levothyroxine 25 MCG  tablet Commonly known as:  SYNTHROID, LEVOTHROID Take 25 mcg by mouth daily before breakfast.   oxyCODONE-acetaminophen 5-325 MG tablet Commonly known as:  PERCOCET/ROXICET Take 1 tablet by mouth every 4 (four) hours as needed for up to 7 days for severe pain.   promethazine 12.5 MG tablet Commonly known as:  PHENERGAN Take 1 tablet (12.5 mg total) by mouth every 4 (four) hours as needed for nausea or vomiting.   Vitamin D (Ergocalciferol) 1.25 MG (50000 UT) Caps capsule Commonly known as:  DRISDOL Take 50,000 Units by mouth every Monday.      Follow-up Information    Sherlyn Hay, DO. Go in 2 week(s).   Specialty:  Obstetrics and Gynecology Why:  For incision check and 6 weeks for post op visit Contact information: [place] [address] [phone]           Signed: Isaiah Serge 01/09/2018, 7:39 AM

## 2018-01-09 NOTE — Progress Notes (Signed)
Patient ID: Paula Cervantes, female   DOB: Oct 22, 1975, 42 y.o.   MRN: [id number] Pt doing better. Kept dinner down overnight. No nausea this am. She reports better pain control with percocet over dilaudid in retrospect. Passing flatus and voiding well. Bleeding scant.  VSS ABD - incisions c/d/i EXT - no homans  A/P: POD#2 s/p TLH/BS - stable         Reviewed discharge instructions         F/u in two weeks for incision check

## 2018-01-09 NOTE — Discharge Instructions (Signed)
Call with any concerns [phone]

## 2018-01-22 ENCOUNTER — Inpatient Hospital Stay (HOSPITAL_COMMUNITY)
Admission: AD | Admit: 2018-01-22 | Payer: BLUE CROSS/BLUE SHIELD | Source: Home / Self Care | Admitting: Obstetrics and Gynecology

## 2018-03-10 ENCOUNTER — Ambulatory Visit: Payer: BLUE CROSS/BLUE SHIELD | Admitting: Emergency Medicine

## 2018-03-11 ENCOUNTER — Ambulatory Visit: Payer: BLUE CROSS/BLUE SHIELD

## 2018-03-11 ENCOUNTER — Ambulatory Visit
Admission: EM | Admit: 2018-03-11 | Discharge: 2018-03-11 | Disposition: A | Discharge status: Home / Self Care | Payer: BLUE CROSS/BLUE SHIELD | Attending: Family Medicine | Admitting: Family Medicine

## 2018-03-11 DIAGNOSIS — M160 Bilateral primary osteoarthritis of hip: Secondary | ICD-10-CM | POA: Diagnosis not present

## 2018-03-11 DIAGNOSIS — M25559 Pain in unspecified hip: Secondary | ICD-10-CM

## 2018-03-11 DIAGNOSIS — M1612 Unilateral primary osteoarthritis, left hip: Secondary | ICD-10-CM

## 2018-03-11 MED ORDER — PREDNISONE 20 MG PO TABS: 20.0000 mg | ORAL_TABLET | Freq: Two times a day (BID) | ORAL | 0 refills | Status: AC

## 2018-03-11 NOTE — Discharge Instructions (Addendum)
X-rays did not show fracture or dislocation, but did show arthritis Continue conservative management of rest, ice, and gentle stretches Avoid painful activities.  Work note given to use standing desk at work Prednisone prescribed.  Take as directed and to completion.  Follow up with orthopedist for further evaluation and management Return or go to the ER if you have any new or worsening symptoms (fever, chills, chest pain, abdominal pain, changes in bowel or bladder habits, pain radiating into lower legs, etc...)

## 2018-03-11 NOTE — ED Provider Notes (Addendum)
[facility]   [id number] 03/11/18 Arrival Time: 3151  CC: Left hip pain  SUBJECTIVE: History from: patient. Paula Cervantes is a 43 y.o. female complains of left hip pain that began 5 days ago.  Denies a precipitating event or specific injury.  Pain is diffuse about the hip.  Describes the pain as constant and sharp in character.  Has tried OTC medications including motrin and advil without relief.  Symptoms are made worse with weight-bearing activities.  Denies similar symptoms in the past.  Denies fever, chills, erythema, ecchymosis, effusion, weakness, numbness or tingling.      ROS: As per HPI.  Past Medical History:  Diagnosis Date  . Abnormal Pap smear    no cervical cancer, just h/o cryotherapy  . Anemia   . Arthritis    knees  . Asthma    hx as a child, no problems as adult, no inhaler  . Endometriosis   . Fibroids   . Gout    knees  . Headache   . Heartburn during pregnancy   . History of hiatal hernia   . Hypothyroidism   . Lactose intolerance    Past Surgical History:  Procedure Laterality Date  . CESAREAN SECTION N/A 09/20/2012   Procedure: PRIMARY CESAREAN SECTION;  Surgeon: Margarette Asal, MD;  Location: [facility];  Service: Obstetrics;  Laterality: N/A;  Primary edc 8/25  . CESAREAN SECTION WITH BILATERAL TUBAL LIGATION Bilateral 02/09/2014   Procedure: CESAREAN SECTION WITH BILATERAL TUBAL LIGATION;  Surgeon: Logan Bores, MD;  Location: [facility];  Service: Obstetrics;  Laterality: Bilateral;  . COLONOSCOPY     polyps  . CRYOTHERAPY    . CYSTOSCOPY N/A 01/07/2018   Procedure: CYSTOSCOPY;  Surgeon: Sherlyn Hay, DO;  Location: [facility];  Service: Gynecology;  Laterality: N/A;  . DIAGNOSTIC LAPAROSCOPY    . KNEE SURGERY Bilateral    x 3 arthroscopic knee  . ovarian fibroids    . TOTAL LAPAROSCOPIC HYSTERECTOMY WITH SALPINGECTOMY Bilateral 01/07/2018   Procedure: TOTAL LAPAROSCOPIC HYSTERECTOMY WITH SALPINGECTOMY;  Surgeon: Sherlyn Hay, DO;  Location: [facility];  Service: Gynecology;  Laterality: Bilateral;  . TUBAL LIGATION  2016  . UPPER GI ENDOSCOPY    . WISDOM TOOTH EXTRACTION     Allergies  Allergen Reactions  . Aspirin Hives and Nausea And Vomiting  . Coconut Flavor Hives  . Other Other (See Comments)    Hi Chew Candy- caused blisters inside mouth.  . Penicillins Nausea And Vomiting and Other (See Comments)    Passed out Has patient had a PCN reaction causing immediate rash, facial/tongue/throat swelling, SOB or lightheadedness with hypotension: unknown Has patient had a PCN reaction causing severe rash involving mucus membranes or skin necrosis: unknown Has patient had a PCN reaction that required hospitalization: unknown Has patient had a PCN reaction occurring within the last 10 years: no If all of the above answers are "NO", then may proceed with Cephalosporin use.   . Sulfa Antibiotics Other (See Comments)    Pt states that this medication causes blisters in mouth and throat.    No current facility-administered medications on file prior to encounter.    Current Outpatient Medications on File Prior to Encounter  Medication Sig Dispense Refill  . acetaminophen (TYLENOL) 500 MG tablet Take 1,000 mg by mouth every 8 (eight) hours as needed for moderate pain.    Marland Kitchen FeFum-FePoly-FA-B Cmp-C-Biot (FOLIVANE-PLUS) CAPS Take 1 capsule by mouth daily.     Marland Kitchen ibuprofen (  ADVIL,MOTRIN) 600 MG tablet Take 1 tablet (600 mg total) by mouth every 6 (six) hours as needed for moderate pain or cramping. 40 tablet 1   Social History   Socioeconomic History  . Marital status: Married    Spouse name: Not on file  . Number of children: 2  . Years of education: Not on file  . Highest education level: Not on file  Occupational History  . Not on file  Social Needs  . Financial resource strain: Not on file  . Food insecurity:    Worry: Not on file    Inability: Not on file  . Transportation needs:    Medical: Not on  file    Non-medical: Not on file  Tobacco Use  . Smoking status: Former Smoker    Packs/day: 1.00    Types: Cigarettes    Last attempt to quit: 03/27/2002    Years since quitting: 15.9  . Smokeless tobacco: Never Used  Substance and Sexual Activity  . Alcohol use: No  . Drug use: No  . Sexual activity: Yes    Birth control/protection: Other-see comments    Comment: tubal ligation   Lifestyle  . Physical activity:    Days per week: Not on file    Minutes per session: Not on file  . Stress: Not on file  Relationships  . Social connections:    Talks on phone: Not on file    Gets together: Not on file    Attends religious service: Not on file    Active member of club or organization: Not on file    Attends meetings of clubs or organizations: Not on file    Relationship status: Not on file  . Intimate partner violence:    Fear of current or ex partner: Not on file    Emotionally abused: Not on file    Physically abused: Not on file    Forced sexual activity: Not on file  Other Topics Concern  . Not on file  Social History Narrative  . Not on file   Family History  Problem Relation Age of Onset  . Diabetes Mother   . Hypertension Mother   . Hyperlipidemia Mother   . Heart disease Maternal Grandmother   . Alcohol abuse Neg Hx   . Arthritis Neg Hx   . Asthma Neg Hx   . Birth defects Neg Hx   . Cancer Neg Hx   . COPD Neg Hx   . Depression Neg Hx   . Drug abuse Neg Hx   . Early death Neg Hx   . Hearing loss Neg Hx   . Kidney disease Neg Hx   . Learning disabilities Neg Hx   . Mental illness Neg Hx   . Mental retardation Neg Hx   . Miscarriages / Stillbirths Neg Hx   . Stroke Neg Hx   . Vision loss Neg Hx   . Varicose Veins Neg Hx     OBJECTIVE:  Vitals:   03/11/18 1607  BP: 116/77  Pulse: 94  Temp: 97.7 F (36.5 C)  TempSrc: Oral  SpO2: 96%    General appearance: AOx3; in no acute distress.  Head: NCAT Lungs: CTA bilaterally Heart: RRR.    Musculoskeletal: Left hip pain Inspection: Skin warm, dry, clear and intact without obvious erythema, effusion, or ecchymosis.  Palpation: Diffusely TTP over lateral and anterior hip ROM: LROM about the hip Strength: 5/5 hip flexion, 5/5 knee abduction, 5/5 knee adduction, 5/5 knee flexion, 5/5  knee extension, 5/5 dorsiflexion, 5/5 plantar flexion Skin: warm and dry Neurologic: Ambulates without difficulty; Sensation intact about the lower extremities Psychological: alert and cooperative; normal mood and affect  DIAGNOSTIC STUDIES:  Dg Hip Unilat With Pelvis 2-3 Views Left  Result Date: 03/11/2018 CLINICAL DATA:  Left hip pain for the past 5 days. EXAM: DG HIP (WITH OR WITHOUT PELVIS) 2-3V LEFT COMPARISON:  None. FINDINGS: Mild bilateral femoral head and neck junction spur formation. No fracture or dislocation seen. Mild L5-S1 facet degenerative changes. Bilateral pelvic phleboliths. IMPRESSION: Mild bilateral hip degenerative changes. Electronically Signed   By: Claudie Revering M.D.   On: 03/11/2018 17:27     ASSESSMENT & PLAN:  1. Primary osteoarthritis of left hip   2. Hip pain    Meds ordered this encounter  Medications  . predniSONE (DELTASONE) 20 MG tablet    Sig: Take 1 tablet (20 mg total) by mouth 2 (two) times daily with a meal for 5 days.    Dispense:  10 tablet    Refill:  0    Order Specific Question:   Supervising Provider    Answer:   Raylene Everts [[id number]]   X-rays did not show fracture or dislocation, but did show arthritis Continue conservative management of rest, ice, and gentle stretches Avoid painful activities.  Work note given to use standing desk at work Prednisone prescribed.  Take as directed and to completion.  Follow up with orthopedist for further evaluation and management Return or go to the ER if you have any new or worsening symptoms (fever, chills, chest pain, abdominal pain, changes in bowel or bladder habits, pain radiating into lower legs,  etc...)   Reviewed expectations re: course of current medical issues. Questions answered. Outlined signs and symptoms indicating need for more acute intervention. Patient verbalized understanding. After Visit Summary given.    Lestine Box, PA-C 03/11/18 [place], [place], PA-C 03/11/18 1756

## 2018-03-11 NOTE — ED Triage Notes (Signed)
Pt c/o lt hip pain x5days, worse on movement, feels like arthritis; denies injury

## 2018-03-17 DIAGNOSIS — M7062 Trochanteric bursitis, left hip: Secondary | ICD-10-CM | POA: Diagnosis not present

## 2018-03-17 DIAGNOSIS — M545 Low back pain: Secondary | ICD-10-CM | POA: Diagnosis not present

## 2018-05-15 ENCOUNTER — Other Ambulatory Visit: Payer: Self-pay

## 2018-05-15 ENCOUNTER — Encounter (HOSPITAL_COMMUNITY): Payer: Self-pay | Admitting: Family Medicine

## 2018-05-15 ENCOUNTER — Ambulatory Visit (HOSPITAL_COMMUNITY)
Admission: EM | Admit: 2018-05-15 | Discharge: 2018-05-15 | Disposition: A | Discharge status: Home / Self Care | Payer: BLUE CROSS/BLUE SHIELD | Attending: Family Medicine | Admitting: Family Medicine

## 2018-05-15 DIAGNOSIS — M10061 Idiopathic gout, right knee: Secondary | ICD-10-CM

## 2018-05-15 MED ORDER — KETOROLAC TROMETHAMINE 60 MG/2ML IM SOLN
60.0000 mg | Freq: Once | INTRAMUSCULAR | Status: AC
  Administered 2018-05-15: 60 mg via INTRAMUSCULAR

## 2018-05-15 MED ORDER — ALLOPURINOL 100 MG PO TABS: 100.0000 mg | ORAL_TABLET | Freq: Every day | ORAL | 3 refills | Status: DC

## 2018-05-15 MED ORDER — ONDANSETRON 4 MG PO TBDP
ORAL_TABLET | ORAL | Status: AC
  Filled 2018-05-15: qty 1

## 2018-05-15 MED ORDER — COLCHICINE 0.6 MG PO TABS: ORAL_TABLET | ORAL | 3 refills | Status: DC

## 2018-05-15 MED ORDER — KETOROLAC TROMETHAMINE 60 MG/2ML IM SOLN
INTRAMUSCULAR | Status: AC
  Filled 2018-05-15: qty 2

## 2018-05-15 NOTE — ED Provider Notes (Signed)
[place]    CSN: [id number] Arrival date & time: 05/15/18  1259     History   Chief Complaint Chief Complaint  Patient presents with  . Gout    HPI Paula Cervantes is a 43 y.o. female.   Per pt she has hx of gout and has been having a flare up for about 1 day in her knees. Pt has some obvious swelling in her knees. Pt normally does take allopurinol but does not have any of her medication at this time. Needs refill  Pain began several days ago.  She is in process of hip pain evaluation when the knee began to flare in the right knee.    No fever.   Has had the knee tapped in the past.  6 weeks ago had a prednisone taper.  She works for [employer].       Past Medical History:  Diagnosis Date  . Abnormal Pap smear    no cervical cancer, just h/o cryotherapy  . Anemia   . Arthritis    knees  . Asthma    hx as a child, no problems as adult, no inhaler  . Endometriosis   . Fibroids   . Gout    knees  . Headache   . Heartburn during pregnancy   . History of hiatal hernia   . Hypothyroidism   . Lactose intolerance     Patient Active Problem List   Diagnosis Date Noted  . Dysmenorrhea 01/07/2018  . Status post laparoscopic hysterectomy 01/07/2018  . Iron deficiency anemia due to chronic blood loss 10/07/2017  . DUB (dysfunctional uterine bleeding) 10/07/2017  . Fatigue 10/07/2017  . History of colonic polyps 10/07/2017  . Migraine 08/23/2014  . S/P repeat low transverse C-section 02/09/2014    Past Surgical History:  Procedure Laterality Date  . CESAREAN SECTION N/A 09/20/2012   Procedure: PRIMARY CESAREAN SECTION;  Surgeon: Margarette Asal, MD;  Location: [facility];  Service: Obstetrics;  Laterality: N/A;  Primary edc 8/25  . CESAREAN SECTION WITH BILATERAL TUBAL LIGATION Bilateral 02/09/2014   Procedure: CESAREAN SECTION WITH BILATERAL TUBAL LIGATION;  Surgeon: Logan Bores, MD;  Location: [facility];  Service: Obstetrics;  Laterality:  Bilateral;  . COLONOSCOPY     polyps  . CRYOTHERAPY    . CYSTOSCOPY N/A 01/07/2018   Procedure: CYSTOSCOPY;  Surgeon: Sherlyn Hay, DO;  Location: [facility];  Service: Gynecology;  Laterality: N/A;  . DIAGNOSTIC LAPAROSCOPY    . KNEE SURGERY Bilateral    x 3 arthroscopic knee  . ovarian fibroids    . TOTAL LAPAROSCOPIC HYSTERECTOMY WITH SALPINGECTOMY Bilateral 01/07/2018   Procedure: TOTAL LAPAROSCOPIC HYSTERECTOMY WITH SALPINGECTOMY;  Surgeon: Sherlyn Hay, DO;  Location: [facility];  Service: Gynecology;  Laterality: Bilateral;  . TUBAL LIGATION  2016  . UPPER GI ENDOSCOPY    . WISDOM TOOTH EXTRACTION      OB History    Gravida  3   Para  2   Term  2   Preterm      AB  1   Living  2     SAB  1   TAB      Ectopic      Multiple  0   Live Births  2            Home Medications    Prior to Admission medications   Medication Sig Start Date End Date Taking? Authorizing Provider  allopurinol (ZYLOPRIM)  100 MG tablet Take 1 tablet (100 mg total) by mouth daily. 05/15/18   [name], MD  colchicine 0.6 MG tablet One three times a day as needed with food 05/15/18   [name], MD  FeFum-FePoly-FA-B Cmp-C-Biot [facility]) CAPS Take 1 capsule by mouth daily.     [provider]    Family History Family History  Problem Relation Age of Onset  . Diabetes Mother   . Hypertension Mother   . Hyperlipidemia Mother   . Heart disease Maternal Grandmother   . Alcohol abuse Neg Hx   . Arthritis Neg Hx   . Asthma Neg Hx   . Birth defects Neg Hx   . Cancer Neg Hx   . COPD Neg Hx   . Depression Neg Hx   . Drug abuse Neg Hx   . Early death Neg Hx   . Hearing loss Neg Hx   . Kidney disease Neg Hx   . Learning disabilities Neg Hx   . Mental illness Neg Hx   . Mental retardation Neg Hx   . Miscarriages / Stillbirths Neg Hx   . Stroke Neg Hx   . Vision loss Neg Hx   . Varicose Veins Neg Hx     Social History Social History    Tobacco Use  . Smoking status: Former Smoker    Packs/day: 1.00    Types: Cigarettes    Last attempt to quit: 03/27/2002    Years since quitting: 16.1  . Smokeless tobacco: Never Used  Substance Use Topics  . Alcohol use: No  . Drug use: No     Allergies   Aspirin; Coconut flavor; Other; Penicillins; and Sulfa antibiotics   Review of Systems Review of Systems  Musculoskeletal: Positive for gait problem and joint swelling. Negative for arthralgias, back pain and myalgias.  All other systems reviewed and are negative.    Physical Exam Triage Vital Signs ED Triage Vitals  Enc Vitals Group     BP 05/15/18 1335 129/71     Pulse Rate 05/15/18 1335 87     Resp 05/15/18 1335 16     Temp 05/15/18 1335 98.1 F (36.7 C)     Temp Source 05/15/18 1335 Oral     SpO2 05/15/18 1335 98 %     Weight --      Height --      Head Circumference --      Peak Flow --      Pain Score 05/15/18 1334 8     Pain Loc --      Pain Edu? --      Excl. in [place]? --    No data found.  Updated Vital Signs BP 129/71 (BP Location: Right Arm)   Pulse 87   Temp 98.1 F (36.7 C) (Oral)   Resp 16   SpO2 98%    Physical Exam Vitals signs and nursing note reviewed.  Constitutional:      Appearance: Normal appearance.  HENT:     Head: Normocephalic.     Mouth/Throat:     Mouth: Mucous membranes are moist.  Eyes:     Conjunctiva/sclera: Conjunctivae normal.  Cardiovascular:     Rate and Rhythm: Normal rate.  Pulmonary:     Effort: Pulmonary effort is normal.     Breath sounds: Normal breath sounds.  Musculoskeletal:        General: Swelling and tenderness present. No deformity or signs of injury.     Comments: Moderate right  knee effusion  Skin:    General: Skin is warm and dry.  Neurological:     General: No focal deficit present.     Mental Status: She is alert and oriented to person, place, and time.  Psychiatric:        Mood and Affect: Mood normal.        Behavior: Behavior  normal.      UC Treatments / Results  Labs (all labs ordered are listed, but only abnormal results are displayed) Labs Reviewed - No data to display  EKG None  Radiology No results found.  Procedures Procedures (including critical care time)  Medications Ordered in UC Medications  ketorolac (TORADOL) injection 60 mg (has no administration in time range)    Initial Impression / Assessment and Plan / UC Course  I have reviewed the triage vital signs and the nursing notes.  Pertinent labs & imaging results that were available during my care of the patient were reviewed by me and considered in my medical decision making (see chart for details).    Final Clinical Impressions(s) / UC Diagnoses   Final diagnoses:  Acute idiopathic gout of right knee   Discharge Instructions   None    ED Prescriptions    Medication Sig Dispense Auth. Provider   allopurinol (ZYLOPRIM) 100 MG tablet Take 1 tablet (100 mg total) by mouth daily. 90 tablet [name], MD   colchicine 0.6 MG tablet One three times a day as needed with food 30 tablet [name], MD     Controlled Substance Prescriptions [place] Controlled Substance Registry consulted? Not Applicable   [name], MD 05/15/18 1431

## 2018-05-15 NOTE — ED Triage Notes (Signed)
Per pt she has hx of gout and has been having a flare up for about 1 day in her knees. Pt has some obvious swelling in her knees. Pt normally does take allopurinol but does not have any of her medication at this time. Needs refill

## 2018-05-18 DIAGNOSIS — M25561 Pain in right knee: Secondary | ICD-10-CM | POA: Diagnosis not present

## 2018-05-21 DIAGNOSIS — M25552 Pain in left hip: Secondary | ICD-10-CM | POA: Diagnosis not present

## 2018-06-02 DIAGNOSIS — M1612 Unilateral primary osteoarthritis, left hip: Secondary | ICD-10-CM | POA: Diagnosis not present

## 2018-06-08 DIAGNOSIS — R350 Frequency of micturition: Secondary | ICD-10-CM | POA: Diagnosis not present

## 2018-06-08 DIAGNOSIS — N3946 Mixed incontinence: Secondary | ICD-10-CM | POA: Diagnosis not present

## 2018-08-17 ENCOUNTER — Other Ambulatory Visit: Payer: Self-pay | Admitting: Family Medicine

## 2018-08-17 ENCOUNTER — Telehealth: Payer: Self-pay | Admitting: Family Medicine

## 2018-08-17 DIAGNOSIS — Z20828 Contact with and (suspected) exposure to other viral communicable diseases: Secondary | ICD-10-CM | POA: Diagnosis not present

## 2018-08-17 DIAGNOSIS — Z20822 Contact with and (suspected) exposure to covid-19: Secondary | ICD-10-CM

## 2018-08-17 NOTE — Telephone Encounter (Signed)
Patient stated she spoke to [facility] 08/17/18 and he stated he was going to get orders for her and her husband for today to get tested for Covid 19  Patient is at the test site and very upset because there are no orders there  Adv patient would have Corene Cornea give her a call back

## 2018-08-18 ENCOUNTER — Other Ambulatory Visit: Payer: BLUE CROSS/BLUE SHIELD

## 2018-08-18 DIAGNOSIS — Z20822 Contact with and (suspected) exposure to covid-19: Secondary | ICD-10-CM

## 2018-08-18 DIAGNOSIS — Z20828 Contact with and (suspected) exposure to other viral communicable diseases: Secondary | ICD-10-CM

## 2018-08-21 LAB — NOVEL CORONAVIRUS, NAA: SARS-CoV-2, NAA: NOT DETECTED

## 2018-09-06 DIAGNOSIS — M160 Bilateral primary osteoarthritis of hip: Secondary | ICD-10-CM | POA: Diagnosis not present

## 2018-09-13 DIAGNOSIS — M160 Bilateral primary osteoarthritis of hip: Secondary | ICD-10-CM | POA: Diagnosis not present

## 2018-09-22 ENCOUNTER — Encounter: Payer: Self-pay | Admitting: Family Medicine

## 2018-10-25 ENCOUNTER — Ambulatory Visit (INDEPENDENT_AMBULATORY_CARE_PROVIDER_SITE_OTHER): Payer: BC Managed Care – PPO | Admitting: Family Medicine

## 2018-10-25 ENCOUNTER — Encounter: Payer: Self-pay | Admitting: Family Medicine

## 2018-10-25 ENCOUNTER — Other Ambulatory Visit: Payer: Self-pay

## 2018-10-25 VITALS — BP 115/71 | HR 70 | Temp 98.7°F | Ht 67.0 in | Wt 242.6 lb

## 2018-10-25 DIAGNOSIS — M160 Bilateral primary osteoarthritis of hip: Secondary | ICD-10-CM

## 2018-10-25 DIAGNOSIS — R3121 Asymptomatic microscopic hematuria: Secondary | ICD-10-CM

## 2018-10-25 DIAGNOSIS — Z01818 Encounter for other preprocedural examination: Secondary | ICD-10-CM | POA: Diagnosis not present

## 2018-10-25 DIAGNOSIS — Z23 Encounter for immunization: Secondary | ICD-10-CM

## 2018-10-25 DIAGNOSIS — Z6838 Body mass index (BMI) 38.0-38.9, adult: Secondary | ICD-10-CM | POA: Diagnosis not present

## 2018-10-25 DIAGNOSIS — R7303 Prediabetes: Secondary | ICD-10-CM

## 2018-10-25 LAB — POCT URINALYSIS DIP (MANUAL ENTRY)
Glucose, UA: NEGATIVE mg/dL
Ketones, POC UA: NEGATIVE mg/dL
Leukocytes, UA: NEGATIVE
Nitrite, UA: NEGATIVE
Protein Ur, POC: 30 mg/dL — AB
Spec Grav, UA: >=1.03 — AB (ref 1.010–1.025)
Urobilinogen, UA: 0.2 E.U./dL
pH, UA: 5.5 (ref 5.0–8.0)

## 2018-10-25 LAB — POC MICROSCOPIC URINALYSIS (UMFC): Mucus: ABSENT

## 2018-10-25 MED ORDER — PHENTERMINE HCL 15 MG PO CAPS: 15.0000 mg | ORAL_CAPSULE | ORAL | 2 refills | Status: DC

## 2018-10-25 NOTE — Patient Instructions (Signed)
° ° ° °  If you have lab work done today you will be contacted with your lab results within the next 2 weeks.  If you have not heard from us then please contact us. The fastest way to get your results is to register for My Chart. ° ° °IF you received an x-ray today, you will receive an invoice from [facility]. Please contact [facility] at [phone] with questions or concerns regarding your invoice.  ° °IF you received labwork today, you will receive an invoice from [facility]. Please contact [facility] at [phone] with questions or concerns regarding your invoice.  ° °Our billing staff will not be able to assist you with questions regarding bills from these companies. ° °You will be contacted with the lab results as soon as they are available. The fastest way to get your results is to activate your My Chart account. Instructions are located on the last page of this paperwork. If you have not heard from us regarding the results in 2 weeks, please contact this office. °  ° ° ° °

## 2018-10-25 NOTE — Progress Notes (Signed)
9/21/202011:29 AM  Paula Cervantes 12/25/1975, 43 y.o., female [id number]  Chief Complaint  Patient presents with  . Pre-op Exam    HPI:   Patient is a 43 y.o. 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 0 No No No No  Number falls in past yr: 0 - - - -  Injury with Fall? 0 - - - -  Follow up Falls evaluation completed - - - -     Allergies  Allergen Reactions  . Aspirin Hives and Nausea And Vomiting  . Coconut Flavor Hives  . Other Other (See Comments)    Hi Chew Candy- caused blisters inside mouth.  . Penicillins Nausea And Vomiting and Other (See Comments)    Passed out Has patient had a PCN reaction causing immediate rash, facial/tongue/throat swelling, SOB or lightheadedness with hypotension: unknown Has patient had a PCN reaction causing severe rash involving mucus membranes or skin necrosis: unknown Has patient had a PCN reaction that required  hospitalization: unknown Has patient had a PCN reaction occurring within the last 10 years: no If all of the above answers are "NO", then may proceed with Cephalosporin use.   . Sulfa Antibiotics Other (See Comments)    Pt states that this medication causes blisters in mouth and throat.     Prior to Admission medications   Medication Sig Start Date End Date Taking? Authorizing Provider  allopurinol (ZYLOPRIM) 100 MG tablet Take 1 tablet (100 mg total) by mouth daily. 05/15/18  Yes [name], MD  colchicine 0.6 MG tablet One three times a day as needed with food 05/15/18  Yes [name], MD    Past Medical History:  Diagnosis Date  . Abnormal Pap smear    no cervical cancer, just h/o cryotherapy  . Anemia   . Arthritis    knees  . Asthma    hx as a child, no problems as adult, no inhaler  . Endometriosis   . Fibroids   . Gout    knees  . Headache   . Heartburn during pregnancy   . History of hiatal hernia   . Hypothyroidism   . Lactose intolerance     Past Surgical History:  Procedure Laterality Date  . CESAREAN SECTION N/A 09/20/2012   Procedure: PRIMARY CESAREAN SECTION;  Surgeon: Margarette Asal, MD;  Location: [facility];  Service: Obstetrics;  Laterality: N/A;  Primary edc 8/25  . CESAREAN SECTION WITH BILATERAL TUBAL LIGATION Bilateral 02/09/2014   Procedure: CESAREAN SECTION WITH BILATERAL TUBAL LIGATION;  Surgeon: Logan Bores, MD;  Location: [facility];  Service: Obstetrics;  Laterality: Bilateral;  . COLONOSCOPY     polyps  . CRYOTHERAPY    . CYSTOSCOPY N/A 01/07/2018   Procedure: CYSTOSCOPY;  Surgeon: Sherlyn Hay, DO;  Location: [facility];  Service: Gynecology;  Laterality: N/A;  . DIAGNOSTIC LAPAROSCOPY    . KNEE SURGERY Bilateral    x 3 arthroscopic knee  . ovarian fibroids    . TOTAL LAPAROSCOPIC HYSTERECTOMY WITH SALPINGECTOMY Bilateral 01/07/2018   Procedure: TOTAL LAPAROSCOPIC HYSTERECTOMY WITH SALPINGECTOMY;  Surgeon: Sherlyn Hay, DO;  Location: [facility];  Service: Gynecology;  Laterality: Bilateral;  . TUBAL LIGATION  2016  . UPPER GI ENDOSCOPY    . WISDOM TOOTH EXTRACTION      Social History   Tobacco Use  . Smoking status: Former Smoker    Packs/day: 1.00    Types: Cigarettes    Quit date: 03/27/2002    Years since quitting: 16.5  . Smokeless tobacco: Never Used  Substance Use Topics  . Alcohol use: No    Family History  Problem Relation Age of Onset  . Diabetes Mother   . Hypertension Mother   . Hyperlipidemia Mother   . Heart disease Maternal Grandmother   . Alcohol abuse Neg Hx   . Arthritis Neg Hx   . Asthma Neg Hx   . Birth defects Neg Hx   . Cancer Neg Hx   . COPD Neg Hx   . Depression Neg Hx   . Drug abuse Neg Hx   . Early death Neg Hx   . Hearing loss Neg Hx   . Kidney disease Neg Hx   . Learning disabilities Neg Hx   . Mental illness Neg Hx   . Mental retardation Neg Hx   . Miscarriages / Stillbirths Neg Hx   . Stroke Neg Hx   . Vision loss Neg Hx   . Varicose Veins Neg Hx     Review of Systems  Constitutional: Negative for chills and fever.  Respiratory: Negative for cough and shortness of breath.   Cardiovascular: Negative for chest pain, palpitations and leg swelling.  Gastrointestinal: Negative for abdominal pain, nausea and vomiting.  Genitourinary: Negative for dysuria.  Endo/Heme/Allergies: Negative for polydipsia.  per hpi   OBJECTIVE:  Today's Vitals   10/25/18 1122  BP: 115/71  Pulse: 70  Temp: 98.7 F (37.1 C)  TempSrc: Oral  SpO2: 97%  Weight: 242 lb 9 oz (110 kg)  Height: 5\' 7"  (1.702 m)   Body mass index is 37.99 kg/m.   Physical Exam Vitals signs and nursing note reviewed.  Constitutional:      Appearance: She is well-developed.  HENT:     Head: Normocephalic and atraumatic.     Right Ear: Hearing, tympanic membrane, ear canal and external ear normal.     Left Ear: Hearing, tympanic membrane, ear canal and external ear normal.   Eyes:     Conjunctiva/sclera: Conjunctivae normal.     Pupils: Pupils are equal, round, and reactive to light.  Neck:     Musculoskeletal: Neck supple.     Thyroid: No thyromegaly.  Cardiovascular:     Rate and Rhythm: Normal rate and regular rhythm.     Heart sounds: Normal heart sounds. No murmur. No friction rub. No gallop.   Pulmonary:  Effort: Pulmonary effort is normal.     Breath sounds: Normal breath sounds. No wheezing, rhonchi or rales.  Abdominal:     General: Bowel sounds are normal. There is no distension.     Palpations: Abdomen is soft. There is no mass.     Tenderness: There is no abdominal tenderness.  Musculoskeletal: Normal range of motion.     Right lower leg: No edema.     Left lower leg: No edema.  Lymphadenopathy:     Cervical: No cervical adenopathy.  Skin:    General: Skin is warm and dry.  Neurological:     Mental Status: She is alert and oriented to person, place, and time.     Cranial Nerves: No cranial nerve deficit.     Gait: Gait abnormal.     Deep Tendon Reflexes: Reflexes are normal and symmetric. Reflexes normal.  Psychiatric:        Mood and Affect: Mood normal.        Behavior: Behavior normal.     Results for orders placed or performed in visit on 10/25/18 (from the past 24 hour(s))  CBC     Status: None   Collection Time: 10/25/18 11:33 AM  Result Value Ref Range   WBC 4.6 3.4 - 10.8 x10E3/uL   RBC 4.42 3.77 - 5.28 x10E6/uL   Hemoglobin 13.0 11.1 - 15.9 g/dL   Hematocrit 39.3 34.0 - 46.6 %   MCV 89 79 - 97 fL   MCH 29.4 26.6 - 33.0 pg   MCHC 33.1 31.5 - 35.7 g/dL   RDW 12.7 11.7 - 15.4 %   Platelets 331 150 - 450 x10E3/uL   Narrative   Performed at:  [address] [place], [place]  [id number] Lab Director: Rush Farmer MD, Phone:  [phone]  Comprehensive metabolic panel     Status: Abnormal   Collection Time: 10/25/18 11:33 AM  Result Value Ref Range   Glucose 107 (H) 65 - 99 mg/dL   BUN 9 6  - 24 mg/dL   Creatinine, Ser 0.97 0.57 - 1.00 mg/dL   GFR calc non Af Amer 73 >59 mL/min/1.73   GFR calc Af Amer 84 >59 mL/min/1.73   BUN/Creatinine Ratio 9 9 - 23   Sodium 140 134 - 144 mmol/L   Potassium 4.1 3.5 - 5.2 mmol/L   Chloride 104 96 - 106 mmol/L   CO2 23 20 - 29 mmol/L   Calcium 9.2 8.7 - 10.2 mg/dL   Total Protein 6.5 6.0 - 8.5 g/dL   Albumin 3.8 3.8 - 4.8 g/dL   Globulin, Total 2.7 1.5 - 4.5 g/dL   Albumin/Globulin Ratio 1.4 1.2 - 2.2   Bilirubin Total 0.3 0.0 - 1.2 mg/dL   Alkaline Phosphatase 64 39 - 117 IU/L   AST 12 0 - 40 IU/L   ALT 14 0 - 32 IU/L   Narrative   Performed at:  [address] [place], [place]  [id number] Lab Director: Rush Farmer MD, Phone:  [phone]  POCT urinalysis dipstick     Status: Abnormal   Collection Time: 10/25/18 11:45 AM  Result Value Ref Range   Color, UA yellow yellow   Clarity, UA cloudy (A) clear   Glucose, UA negative negative mg/dL   Bilirubin, UA small (A) negative   Ketones, POC UA negative negative mg/dL   Spec Grav, UA >=1.030 (A) 1.010 - 1.025   Blood, UA trace-intact (A) negative   pH,  UA 5.5 5.0 - 8.0   Protein Ur, POC =30 (A) negative mg/dL   Urobilinogen, UA 0.2 0.2 or 1.0 E.U./dL   Nitrite, UA Negative Negative   Leukocytes, UA Negative Negative  POCT Microscopic Urinalysis ([facility])     Status: Abnormal   Collection Time: 10/25/18 12:10 PM  Result Value Ref Range   WBC,UR,HPF,POC Moderate (A) None WBC/hpf   RBC,UR,HPF,POC None None RBC/hpf   Bacteria Few (A) None, Too numerous to count   Mucus Absent Absent   Epithelial Cells, UR Per Microscopy Moderate (A) None, Too numerous to count cells/hpf  Hemoglobin A1c     Status: Abnormal   Collection Time: 10/25/18 12:36 PM  Result Value Ref Range   Hgb A1c MFr Bld 6.3 (H) 4.8 - 5.6 %   Est. average glucose Bld gHb Est-mCnc 134 mg/dL   Narrative   Performed at:  [address], [place], [place]  [id number] Lab  Director: Rush Farmer MD, Phone:  [phone]    My interpretation of EKG:  NSR, HR 75, no st changes  No results found.   ASSESSMENT and PLAN  1. Preop examination Labs and EKG reviewed. Patient medically optimized, no apparent increased risk of major adverse cardiac event.  - EKG 12-Lead - CBC - POCT urinalysis dipstick - Comprehensive metabolic panel - Hemoglobin A1c  2. Primary osteoarthritis of both hips FMLA paperwork completed for period until surgery  3. Asymptomatic microscopic hematuria - POCT Microscopic Urinalysis ([facility]) - Urine Culture  4. BMI 38.0-38.9,adult Patient very limited regarding exercise. Discussed diet. Trial of phentermine, reviewed r/se/b.  - Hemoglobin A1c  5. Need for influenza vaccination - Flu Vaccine QUAD 36+ mos IM  Other orders - phentermine 15 MG capsule; Take 1 capsule (15 mg total) by mouth every morning.  Return for after surgery.    Rutherford Guys, MD [facility], [place] [address] Ph.  [phone] Fax [phone]

## 2018-10-26 DIAGNOSIS — R7303 Prediabetes: Secondary | ICD-10-CM | POA: Insufficient documentation

## 2018-10-26 LAB — HEMOGLOBIN A1C
Est. average glucose Bld gHb Est-mCnc: 134 mg/dL
Hgb A1c MFr Bld: 6.3 % — ABNORMAL HIGH (ref 4.8–5.6)

## 2018-10-26 LAB — CBC
Hematocrit: 39.3 % (ref 34.0–46.6)
Hemoglobin: 13 g/dL (ref 11.1–15.9)
MCH: 29.4 pg (ref 26.6–33.0)
MCHC: 33.1 g/dL (ref 31.5–35.7)
MCV: 89 fL (ref 79–97)
Platelets: 331 10*3/uL (ref 150–450)
RBC: 4.42 x10E6/uL (ref 3.77–5.28)
RDW: 12.7 % (ref 11.7–15.4)
WBC: 4.6 10*3/uL (ref 3.4–10.8)

## 2018-10-26 LAB — COMPREHENSIVE METABOLIC PANEL
ALT: 14 IU/L (ref 0–32)
AST: 12 IU/L (ref 0–40)
Albumin/Globulin Ratio: 1.4 (ref 1.2–2.2)
Albumin: 3.8 g/dL (ref 3.8–4.8)
Alkaline Phosphatase: 64 IU/L (ref 39–117)
BUN/Creatinine Ratio: 9 (ref 9–23)
BUN: 9 mg/dL (ref 6–24)
Bilirubin Total: 0.3 mg/dL (ref 0.0–1.2)
CO2: 23 mmol/L (ref 20–29)
Calcium: 9.2 mg/dL (ref 8.7–10.2)
Chloride: 104 mmol/L (ref 96–106)
Creatinine, Ser: 0.97 mg/dL (ref 0.57–1.00)
GFR calc Af Amer: 84 mL/min/{1.73_m2} (ref >59)
GFR calc non Af Amer: 73 mL/min/{1.73_m2} (ref >59)
Globulin, Total: 2.7 g/dL (ref 1.5–4.5)
Glucose: 107 mg/dL — ABNORMAL HIGH (ref 65–99)
Potassium: 4.1 mmol/L (ref 3.5–5.2)
Sodium: 140 mmol/L (ref 134–144)
Total Protein: 6.5 g/dL (ref 6.0–8.5)

## 2018-10-27 ENCOUNTER — Telehealth: Payer: Self-pay | Admitting: Family Medicine

## 2018-10-27 LAB — URINE CULTURE

## 2018-10-27 NOTE — Telephone Encounter (Signed)
Called pt by mistake, meant to call her pharmacy with the approval of her phentermine. But she already picked it up.

## 2018-11-02 ENCOUNTER — Encounter: Payer: Self-pay | Admitting: Family Medicine

## 2018-11-04 ENCOUNTER — Ambulatory Visit: Payer: Self-pay | Admitting: Family Medicine

## 2018-11-04 DIAGNOSIS — M160 Bilateral primary osteoarthritis of hip: Secondary | ICD-10-CM | POA: Diagnosis not present

## 2018-12-01 IMAGING — CR DG SHOULDER 2+V*R*
2 series · 2 of 2 positions shown · non-contrast
Comparison: 06/11/2008

CLINICAL DATA: Altercation.  Right shoulder pain.

EXAM:
RIGHT SHOULDER - 2+ VIEW

[w shoulder external right]
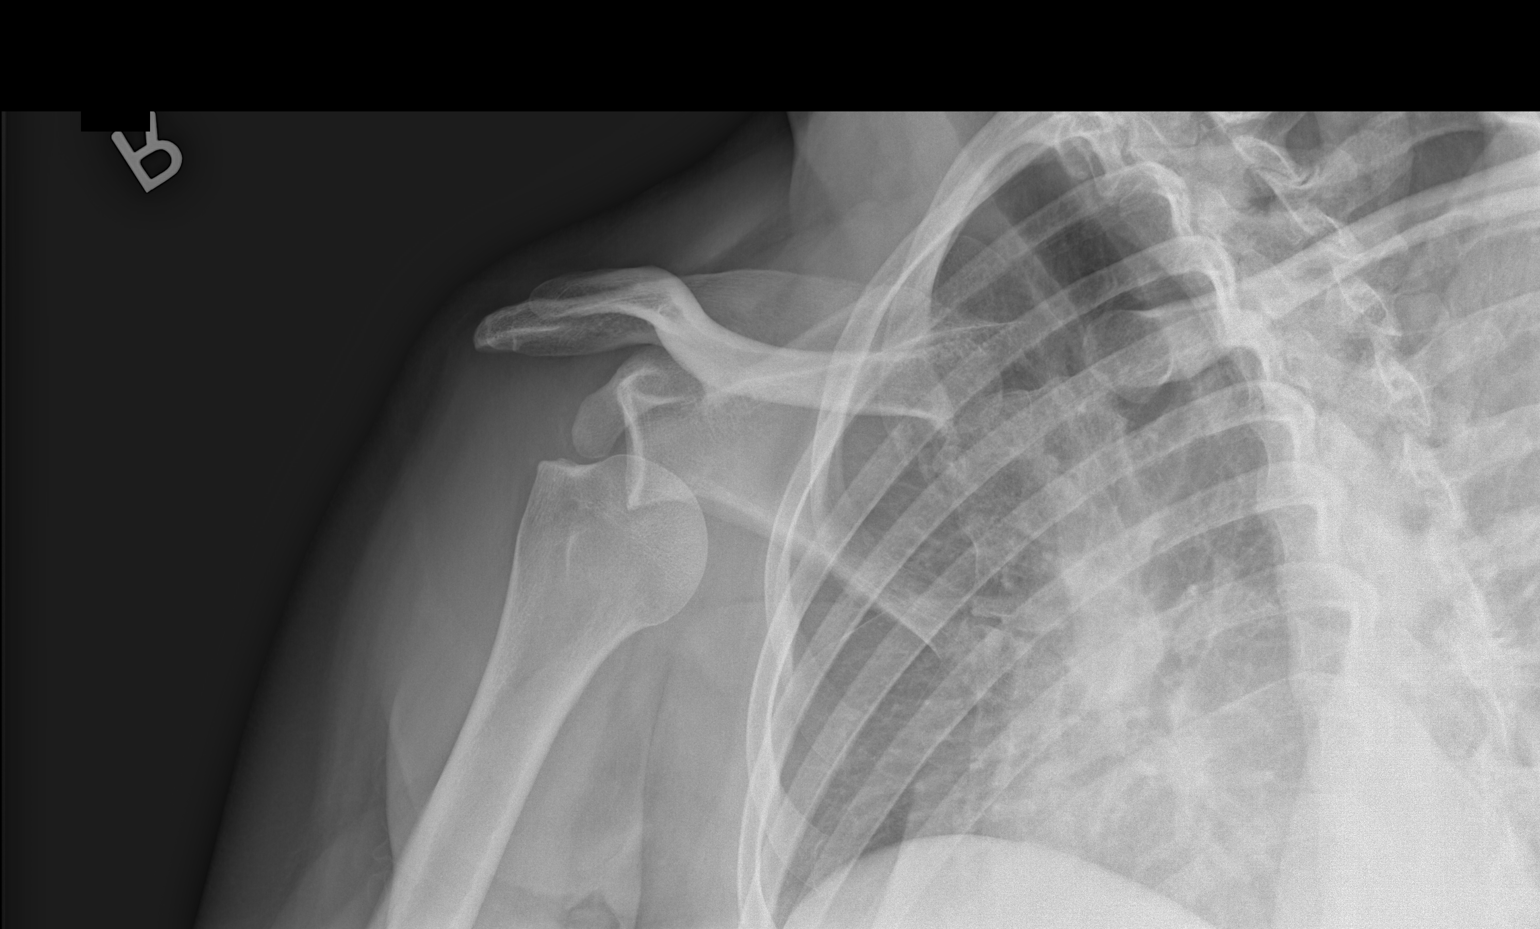

[w shoulder y-view right]
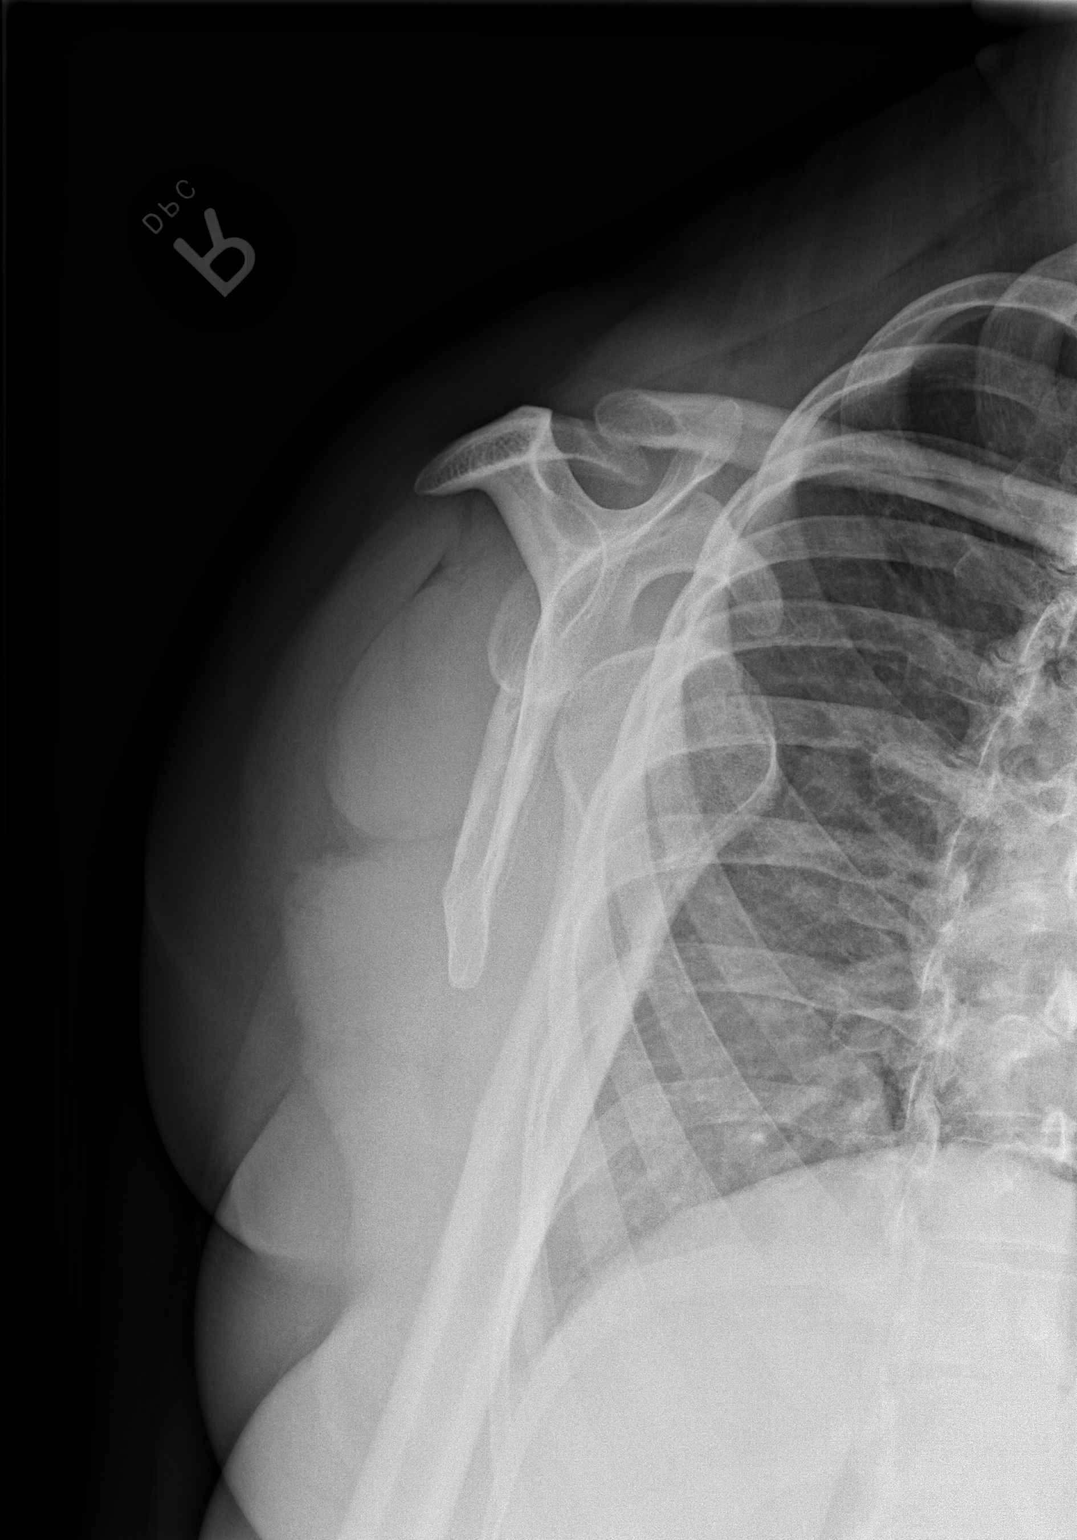

[2 of 2 positions shown; findings below may reference images not displayed]

FINDINGS: There is anterior inferior right shoulder dislocation. No visible
fracture.
IMPRESSION: Anterior/inferior right shoulder dislocation.

## 2018-12-09 DIAGNOSIS — Q6589 Other specified congenital deformities of hip: Secondary | ICD-10-CM | POA: Diagnosis not present

## 2018-12-09 DIAGNOSIS — M1611 Unilateral primary osteoarthritis, right hip: Secondary | ICD-10-CM | POA: Diagnosis not present

## 2018-12-09 DIAGNOSIS — M1631 Unilateral osteoarthritis resulting from hip dysplasia, right hip: Secondary | ICD-10-CM | POA: Diagnosis not present

## 2019-01-12 DIAGNOSIS — M11261 Other chondrocalcinosis, right knee: Secondary | ICD-10-CM | POA: Diagnosis not present

## 2019-02-20 ENCOUNTER — Encounter: Payer: Self-pay | Admitting: Family Medicine

## 2019-02-21 DIAGNOSIS — M1612 Unilateral primary osteoarthritis, left hip: Secondary | ICD-10-CM | POA: Diagnosis not present

## 2019-02-24 ENCOUNTER — Telehealth (INDEPENDENT_AMBULATORY_CARE_PROVIDER_SITE_OTHER): Payer: BC Managed Care – PPO | Admitting: Family Medicine

## 2019-02-24 ENCOUNTER — Telehealth: Payer: Self-pay | Admitting: Family Medicine

## 2019-02-24 ENCOUNTER — Other Ambulatory Visit: Payer: Self-pay

## 2019-02-24 DIAGNOSIS — G8929 Other chronic pain: Secondary | ICD-10-CM | POA: Diagnosis not present

## 2019-02-24 DIAGNOSIS — E6609 Other obesity due to excess calories: Secondary | ICD-10-CM

## 2019-02-24 DIAGNOSIS — M25552 Pain in left hip: Secondary | ICD-10-CM

## 2019-02-24 MED ORDER — PHENTERMINE HCL 30 MG PO CAPS: 30.0000 mg | ORAL_CAPSULE | ORAL | 4 refills | Status: DC

## 2019-02-24 NOTE — Progress Notes (Signed)
Pt is needing refills on phentermine 15mg . No other medical concerns at this time

## 2019-02-24 NOTE — Progress Notes (Signed)
Virtual Visit Note  I connected with patient on 02/24/19 at 1041am by video doximity and verified that I am speaking with the correct person using two identifiers. Paula Cervantes is currently located at home and patient is currently with them during visit. The provider, Rutherford Guys, MD is located in their office at time of visit.  I discussed the limitations, risks, security and privacy concerns of performing an evaluation and management service by telephone and the availability of in person appointments. I also discussed with the patient that there may be a patient responsible charge related to this service. The patient expressed understanding and agreed to proceed.   CC: phenteramine refill  HPI ? Last OV sept 2020 - needed to lose weight for upcoming right hip replacement surgery phenteramine rx given pmp reviewed, she took for 2 months and then restarted again 3 days ago She went down from 242lbs to 224lbs She has recovered from her right hip and is now planning for left hip replacement in 3 months, not able to exercise per ortho, Dr Mardelle Matte She is wanting to refill med to help with keeping BMI down  She tolerated phenteramine 30mg  very well, denied any side effects, no insomnia, palpitations, chest pain, elevated BP, mood changes  Allergies  Allergen Reactions  . Aspirin Hives and Nausea And Vomiting  . Coconut Flavor Hives  . Other Other (See Comments)    Hi Chew Candy- caused blisters inside mouth.  . Penicillins Nausea And Vomiting and Other (See Comments)    Passed out Has patient had a PCN reaction causing immediate rash, facial/tongue/throat swelling, SOB or lightheadedness with hypotension: unknown Has patient had a PCN reaction causing severe rash involving mucus membranes or skin necrosis: unknown Has patient had a PCN reaction that required hospitalization: unknown Has patient had a PCN reaction occurring within the last 10 years: no If all of the above answers are  "NO", then may proceed with Cephalosporin use.   . Sulfa Antibiotics Other (See Comments)    Pt states that this medication causes blisters in mouth and throat.     Prior to Admission medications   Medication Sig Start Date End Date Taking? Authorizing Provider  allopurinol (ZYLOPRIM) 100 MG tablet Take 1 tablet (100 mg total) by mouth daily. 05/15/18   [name], MD  colchicine 0.6 MG tablet One three times a day as needed with food 05/15/18   [name], MD  phentermine 15 MG capsule Take 1 capsule (15 mg total) by mouth every morning. 10/25/18   Rutherford Guys, MD    Past Medical History:  Diagnosis Date  . Abnormal Pap smear    no cervical cancer, just h/o cryotherapy  . Anemia   . Arthritis    knees  . Asthma    hx as a child, no problems as adult, no inhaler  . Endometriosis   . Fibroids   . Gout    knees  . Headache   . Heartburn during pregnancy   . History of hiatal hernia   . Hypothyroidism   . Lactose intolerance     Past Surgical History:  Procedure Laterality Date  . CESAREAN SECTION N/A 09/20/2012   Procedure: PRIMARY CESAREAN SECTION;  Surgeon: Margarette Asal, MD;  Location: [facility];  Service: Obstetrics;  Laterality: N/A;  Primary edc 8/25  . CESAREAN SECTION WITH BILATERAL TUBAL LIGATION Bilateral 02/09/2014   Procedure: CESAREAN SECTION WITH BILATERAL TUBAL LIGATION;  Surgeon: Logan Bores, MD;  Location:  [facility];  Service: Obstetrics;  Laterality: Bilateral;  . COLONOSCOPY     polyps  . CRYOTHERAPY    . CYSTOSCOPY N/A 01/07/2018   Procedure: CYSTOSCOPY;  Surgeon: Sherlyn Hay, DO;  Location: [facility];  Service: Gynecology;  Laterality: N/A;  . DIAGNOSTIC LAPAROSCOPY    . KNEE SURGERY Bilateral    x 3 arthroscopic knee  . ovarian fibroids    . TOTAL LAPAROSCOPIC HYSTERECTOMY WITH SALPINGECTOMY Bilateral 01/07/2018   Procedure: TOTAL LAPAROSCOPIC HYSTERECTOMY WITH SALPINGECTOMY;  Surgeon: Sherlyn Hay, DO;  Location: [facility];  Service: Gynecology;  Laterality: Bilateral;  . TUBAL LIGATION  2016  . UPPER GI ENDOSCOPY    . WISDOM TOOTH EXTRACTION      Social History   Tobacco Use  . Smoking status: Former Smoker    Packs/day: 1.00    Types: Cigarettes    Quit date: 03/27/2002    Years since quitting: 16.9  . Smokeless tobacco: Never Used  Substance Use Topics  . Alcohol use: No    Family History  Problem Relation Age of Onset  . Diabetes Mother   . Hypertension Mother   . Hyperlipidemia Mother   . Heart disease Maternal Grandmother   . Alcohol abuse Neg Hx   . Arthritis Neg Hx   . Asthma Neg Hx   . Birth defects Neg Hx   . Cancer Neg Hx   . COPD Neg Hx   . Depression Neg Hx   . Drug abuse Neg Hx   . Early death Neg Hx   . Hearing loss Neg Hx   . Kidney disease Neg Hx   . Learning disabilities Neg Hx   . Mental illness Neg Hx   . Mental retardation Neg Hx   . Miscarriages / Stillbirths Neg Hx   . Stroke Neg Hx   . Vision loss Neg Hx   . Varicose Veins Neg Hx     ROS Per hpi  Objective  Vitals as reported by the patient: per above  Physical Exam  Constitutional: She is oriented to person, place, and time and well-developed, well-nourished, and in no distress.  HENT:  Head: Normocephalic and atraumatic.  Mouth/Throat: Mucous membranes are normal.  Eyes: Pupils are equal, round, and reactive to light. EOM are normal. No scleral icterus.  Pulmonary/Chest: Effort normal.  Musculoskeletal:     Cervical back: Neck supple.  Neurological: She is alert and oriented to person, place, and time.  Skin: Skin is warm and dry.  Psychiatric: Mood and affect normal.  Nursing note and vitals reviewed.    ASSESSMENT and PLAN  1. Chronic left hip pain 2. Obesity due to excess calories without serious comorbidity, unspecified classification Pending left THA, continue phentermine to assist with weight loss/control. Reviewed r/se/b. Cont with dietary changes. Unable to exercise vigorously  per ortho.   Other orders - phentermine 30 MG capsule; Take 1 capsule (30 mg total) by mouth every morning.  FOLLOW-UP: after surgery   The above assessment and management plan was discussed with the patient. The patient verbalized understanding of and has agreed to the management plan. Patient is aware to call the clinic if symptoms persist or worsen. Patient is aware when to return to the clinic for a follow-up visit. Patient educated on when it is appropriate to go to the emergency department.    I provided 8 minutes of non-face-to-face time during this encounter.  Rutherford Guys, MD [facility] [place],  [place] [address] Ph.  [phone] Fax [phone]

## 2019-02-24 NOTE — Telephone Encounter (Signed)
Prescription for phentermine approved

## 2019-02-24 NOTE — Telephone Encounter (Signed)
PA REQUEST PROCESSED [id number]

## 2019-03-07 DIAGNOSIS — Z136 Encounter for screening for cardiovascular disorders: Secondary | ICD-10-CM | POA: Diagnosis not present

## 2019-03-07 DIAGNOSIS — Z13220 Encounter for screening for lipoid disorders: Secondary | ICD-10-CM | POA: Diagnosis not present

## 2019-03-07 DIAGNOSIS — Z713 Dietary counseling and surveillance: Secondary | ICD-10-CM | POA: Diagnosis not present

## 2019-04-08 ENCOUNTER — Ambulatory Visit: Payer: BC Managed Care – PPO | Admitting: Family Medicine

## 2019-04-11 ENCOUNTER — Other Ambulatory Visit: Payer: Self-pay

## 2019-04-11 ENCOUNTER — Telehealth (INDEPENDENT_AMBULATORY_CARE_PROVIDER_SITE_OTHER): Payer: BC Managed Care – PPO | Admitting: Family Medicine

## 2019-04-11 DIAGNOSIS — M25552 Pain in left hip: Secondary | ICD-10-CM | POA: Diagnosis not present

## 2019-04-11 DIAGNOSIS — M1612 Unilateral primary osteoarthritis, left hip: Secondary | ICD-10-CM

## 2019-04-11 DIAGNOSIS — G8929 Other chronic pain: Secondary | ICD-10-CM | POA: Diagnosis not present

## 2019-04-11 NOTE — Progress Notes (Signed)
Pt is wanting to have forms filled for accomodation with current job. She will be dropping the form off after she speaks with pcp. No other medical concerns

## 2019-04-11 NOTE — Progress Notes (Signed)
Virtual Visit Note  I connected with patient on 04/11/19 at 442pm by video doximity and verified that I am speaking with the correct person using two identifiers. Paula Cervantes is currently located at home and patient is currently with them during visit. The provider, Rutherford Guys, MD is located in their office at time of visit.  I discussed the limitations, risks, security and privacy concerns of performing an evaluation and management service by telephone and the availability of in person appointments. I also discussed with the patient that there may be a patient responsible charge related to this service. The patient expressed understanding and agreed to proceed.   I provided 6 minutes of non-face-to-face time during this encounter.  CC: left hip pain   HPI ? 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 Aspirin Hives and Nausea And Vomiting  . Coconut Flavor Hives  . Other Other (See Comments)    Hi Chew Candy- caused blisters inside mouth.  . Penicillins Nausea And Vomiting and Other (See Comments)    Passed out Has patient had a PCN reaction causing immediate rash, facial/tongue/throat swelling, SOB or lightheadedness with hypotension: unknown Has patient had a PCN reaction causing severe rash involving mucus membranes or skin necrosis: unknown Has  patient had a PCN reaction that required hospitalization: unknown Has patient had a PCN reaction occurring within the last 10 years: no If all of the above answers are "NO", then may proceed with Cephalosporin use.   . Sulfa Antibiotics Other (See Comments)    Pt states that this medication causes blisters in mouth and throat.     Prior to Admission medications   Medication Sig Start Date End Date Taking? Authorizing Provider  allopurinol (ZYLOPRIM) 100 MG tablet Take 1 tablet (100 mg total) by mouth daily. 05/15/18   [name], MD  colchicine 0.6 MG tablet One three times a day as needed with food 05/15/18   [name], MD  phentermine 30 MG capsule Take 1 capsule (30 mg total) by mouth every morning. 02/24/19   Rutherford Guys, MD    Past Medical History:  Diagnosis Date  . Abnormal Pap smear    no cervical cancer, just h/o cryotherapy  . Anemia   . Arthritis    knees  . Asthma    hx as a child, no problems as adult, no inhaler  . Endometriosis   . Fibroids   . Gout    knees  . Headache   . Heartburn during pregnancy   . History of hiatal hernia   . Hypothyroidism   . Lactose intolerance     Past Surgical History:  Procedure Laterality Date  . CESAREAN SECTION N/A 09/20/2012   Procedure: PRIMARY CESAREAN SECTION;  Surgeon: Margarette Asal, MD;  Location: [facility];  Service: Obstetrics;  Laterality: N/A;  Primary edc 8/25  . CESAREAN SECTION WITH BILATERAL TUBAL LIGATION Bilateral 02/09/2014  Procedure: CESAREAN SECTION WITH BILATERAL TUBAL LIGATION;  Surgeon: Logan Bores, MD;  Location: [facility];  Service: Obstetrics;  Laterality: Bilateral;  . COLONOSCOPY     polyps  . CRYOTHERAPY    . CYSTOSCOPY N/A 01/07/2018   Procedure: CYSTOSCOPY;  Surgeon: Sherlyn Hay, DO;  Location: [facility];  Service: Gynecology;  Laterality: N/A;  . DIAGNOSTIC LAPAROSCOPY    . KNEE SURGERY Bilateral    x 3 arthroscopic knee  . ovarian fibroids    . TOTAL LAPAROSCOPIC  HYSTERECTOMY WITH SALPINGECTOMY Bilateral 01/07/2018   Procedure: TOTAL LAPAROSCOPIC HYSTERECTOMY WITH SALPINGECTOMY;  Surgeon: Sherlyn Hay, DO;  Location: [facility];  Service: Gynecology;  Laterality: Bilateral;  . TUBAL LIGATION  2016  . UPPER GI ENDOSCOPY    . WISDOM TOOTH EXTRACTION      Social History   Tobacco Use  . Smoking status: Former Smoker    Packs/day: 1.00    Types: Cigarettes    Quit date: 03/27/2002    Years since quitting: 17.0  . Smokeless tobacco: Never Used  Substance Use Topics  . Alcohol use: No    Family History  Problem Relation Age of Onset  . Diabetes Mother   . Hypertension Mother   . Hyperlipidemia Mother   . Heart disease Maternal Grandmother   . Alcohol abuse Neg Hx   . Arthritis Neg Hx   . Asthma Neg Hx   . Birth defects Neg Hx   . Cancer Neg Hx   . COPD Neg Hx   . Depression Neg Hx   . Drug abuse Neg Hx   . Early death Neg Hx   . Hearing loss Neg Hx   . Kidney disease Neg Hx   . Learning disabilities Neg Hx   . Mental illness Neg Hx   . Mental retardation Neg Hx   . Miscarriages / Stillbirths Neg Hx   . Stroke Neg Hx   . Vision loss Neg Hx   . Varicose Veins Neg Hx     ROS Per hpi  Objective  Vitals as reported by the patient: none  GEN: AAOx3, NAD HEENT: [place]/AT, pupils are symmetrical, EOMI, non-icteric sclera Resp: breathing comfortably, speaking in full sentences Skin: no rashes noted, no pallor Psych: good eye contact, normal mood and affect   ASSESSMENT and PLAN  1. Chronic left hip pain 2. Primary osteoarthritis of left hip Forms to be completed. Accomodation medically necessary to allow continued work until replacement surgery.   FOLLOW-UP: 3-6 months   The above assessment and management plan was discussed with the patient. The patient verbalized understanding of and has agreed to the management plan. Patient is aware to call the clinic if symptoms persist or worsen. Patient is aware when to return to the  clinic for a follow-up visit. Patient educated on when it is appropriate to go to the emergency department.     Rutherford Guys, MD [facility] [place], [place] [address] Ph.  [phone] Fax [phone]

## 2019-04-12 ENCOUNTER — Telehealth: Payer: Self-pay

## 2019-04-12 ENCOUNTER — Telehealth: Payer: Self-pay | Admitting: Family Medicine

## 2019-04-12 NOTE — Telephone Encounter (Signed)
Pt capability and limitation forms were faxed to [place] at [phone]. Marland Kitchen

## 2019-04-12 NOTE — Telephone Encounter (Signed)
Capabilities and limitations paperwork was dropped off , and has been placed in the provider's lounge

## 2019-05-09 NOTE — Telephone Encounter (Signed)
Pt called in regards to her forms that when they were faxed to [place] not all pagers went threw. Pt states that page number 2 is extremely important and that was missing. Pt would like them refaxed. Please advise. [phone]

## 2019-05-10 NOTE — Telephone Encounter (Signed)
Forms have been faxed agian, copy of the forms can be found at nurse's station

## 2019-05-10 NOTE — Telephone Encounter (Signed)
Please Advise

## 2019-05-25 DIAGNOSIS — M1612 Unilateral primary osteoarthritis, left hip: Secondary | ICD-10-CM | POA: Diagnosis not present

## 2019-05-25 DIAGNOSIS — Z96641 Presence of right artificial hip joint: Secondary | ICD-10-CM | POA: Diagnosis not present

## 2019-07-25 DIAGNOSIS — M1612 Unilateral primary osteoarthritis, left hip: Secondary | ICD-10-CM | POA: Diagnosis not present

## 2019-11-27 IMAGING — CR DG SHOULDER 2+V*R*
3 series · 3 of 3 positions shown · non-contrast
Comparison: 02/03/2016

CLINICAL DATA: Shoulder pain for 1 week

EXAM:
RIGHT SHOULDER - 2+ VIEW

[shoulder grashey]
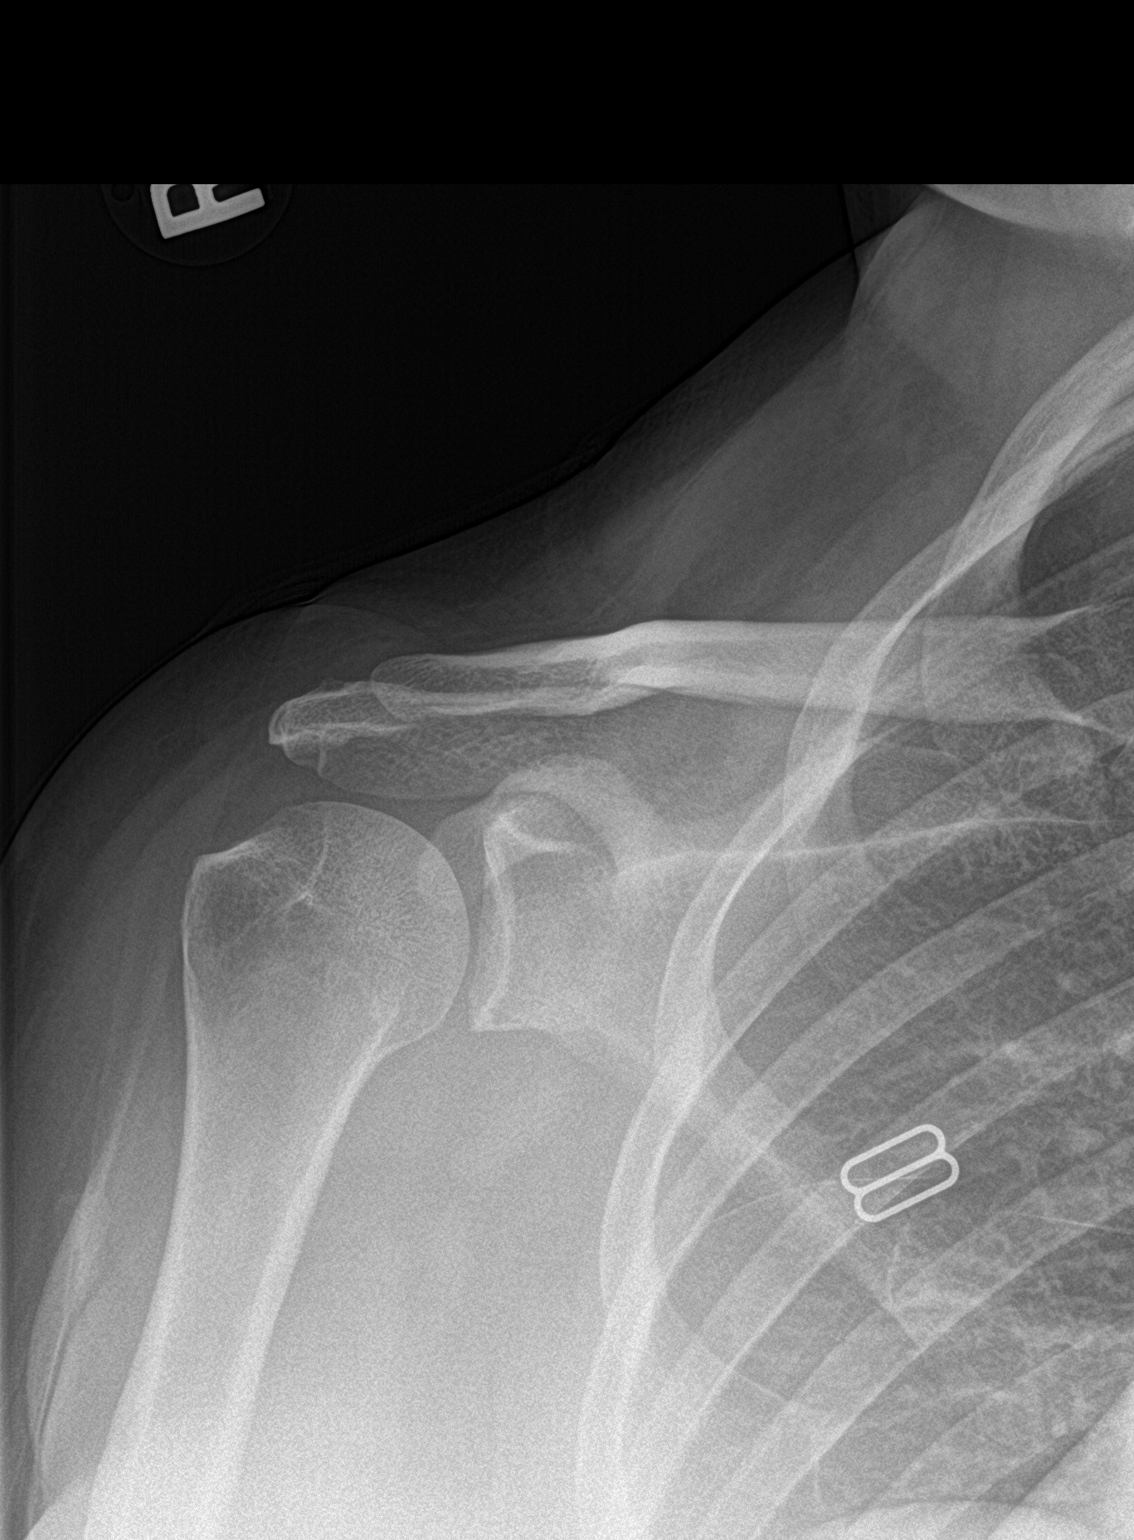

[shoulder y view]
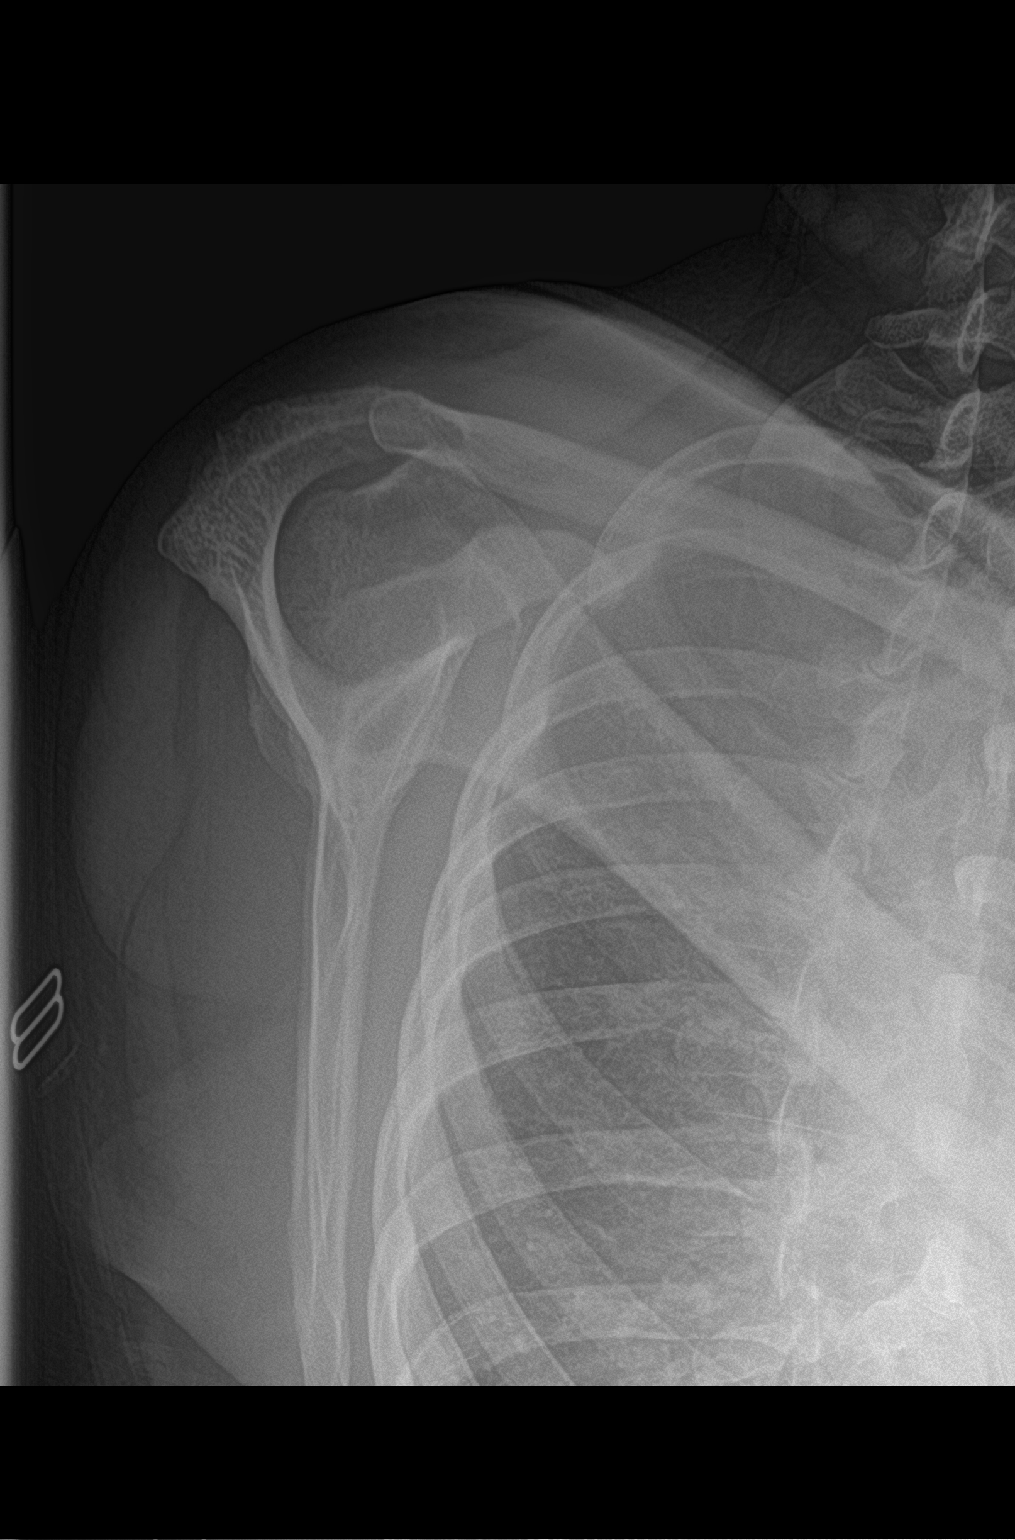

[shoulder axillary]
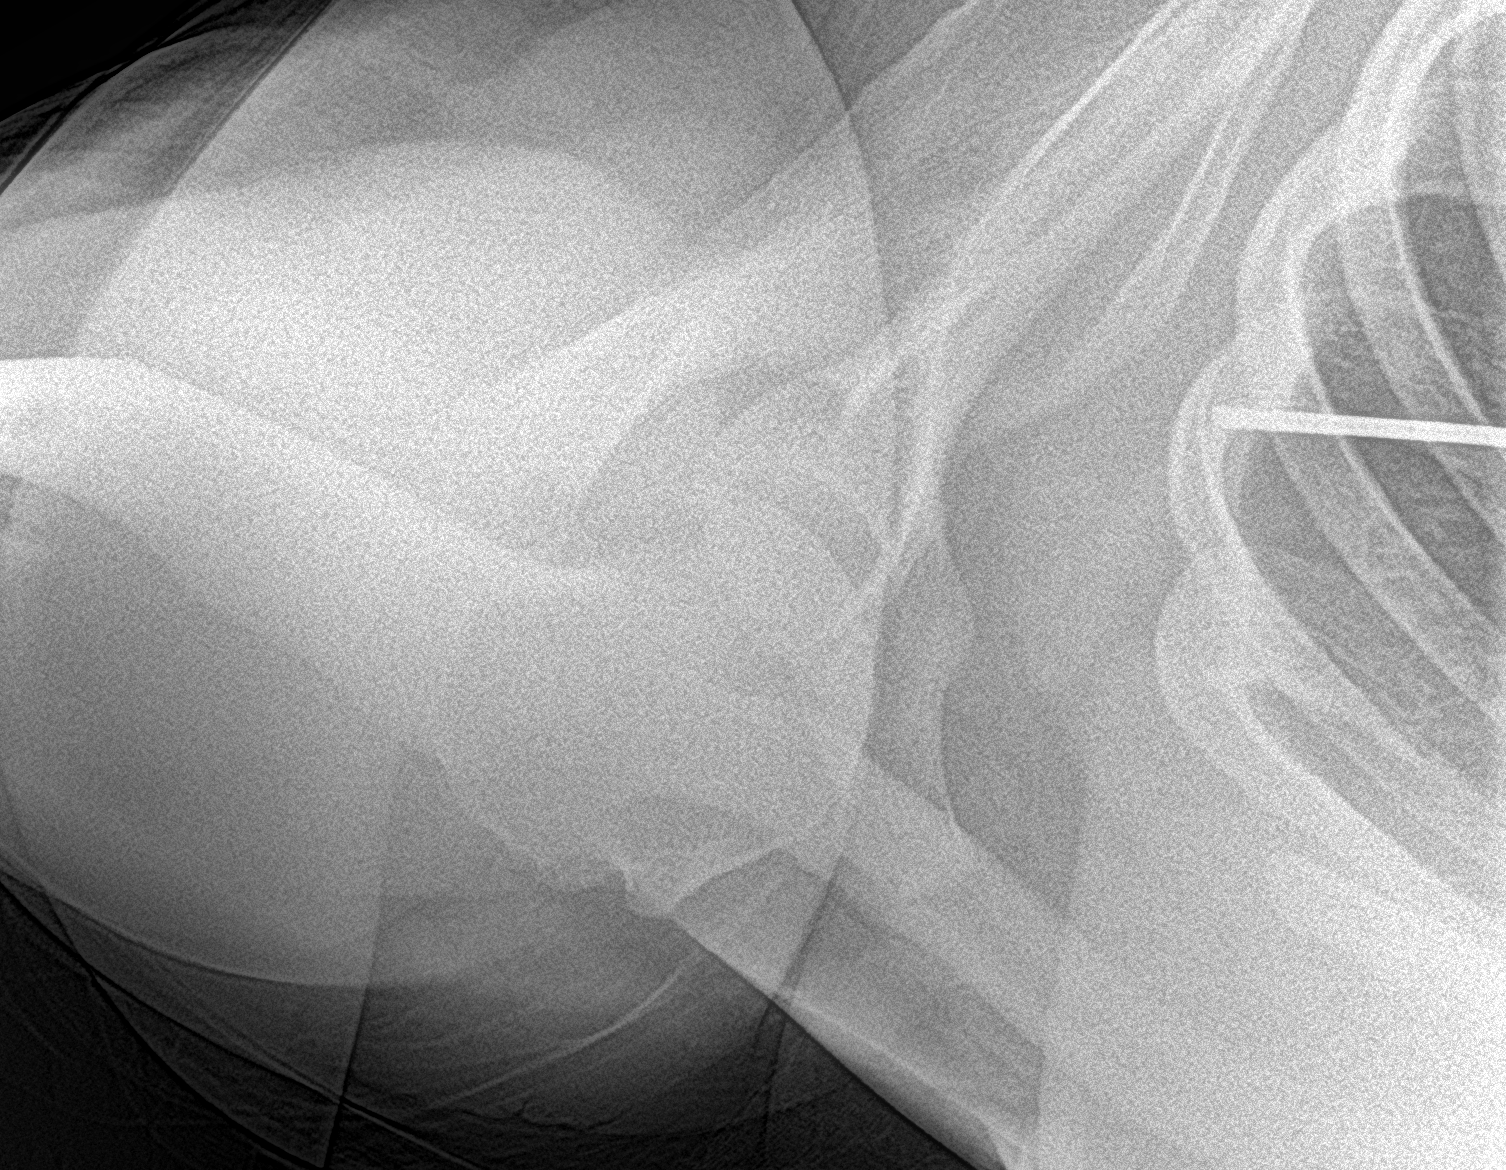

[3 of 3 positions shown; findings below may reference images not displayed]

FINDINGS: There is no evidence of fracture or dislocation. There is no
evidence of arthropathy. Soft tissues are unremarkable.
IMPRESSION: No acute osseous abnormality

## 2019-12-06 ENCOUNTER — Encounter: Payer: Self-pay | Admitting: Registered Nurse

## 2019-12-06 ENCOUNTER — Other Ambulatory Visit: Payer: Self-pay

## 2019-12-06 ENCOUNTER — Ambulatory Visit: Payer: BC Managed Care – PPO | Admitting: Registered Nurse

## 2019-12-06 VITALS — BP 116/77 | HR 81 | Temp 98.3°F | Resp 18 | Ht 67.0 in | Wt 239.6 lb

## 2019-12-06 DIAGNOSIS — Z13220 Encounter for screening for lipoid disorders: Secondary | ICD-10-CM | POA: Diagnosis not present

## 2019-12-06 DIAGNOSIS — F329 Major depressive disorder, single episode, unspecified: Secondary | ICD-10-CM

## 2019-12-06 DIAGNOSIS — R5383 Other fatigue: Secondary | ICD-10-CM

## 2019-12-06 DIAGNOSIS — Z8639 Personal history of other endocrine, nutritional and metabolic disease: Secondary | ICD-10-CM | POA: Diagnosis not present

## 2019-12-06 DIAGNOSIS — L089 Local infection of the skin and subcutaneous tissue, unspecified: Secondary | ICD-10-CM

## 2019-12-06 DIAGNOSIS — H6191 Disorder of right external ear, unspecified: Secondary | ICD-10-CM

## 2019-12-06 DIAGNOSIS — F4321 Adjustment disorder with depressed mood: Secondary | ICD-10-CM

## 2019-12-06 DIAGNOSIS — H60391 Other infective otitis externa, right ear: Secondary | ICD-10-CM

## 2019-12-06 MED ORDER — DOXYCYCLINE HYCLATE 100 MG PO TABS: 100.0000 mg | ORAL_TABLET | Freq: Two times a day (BID) | ORAL | 0 refills | Status: DC

## 2019-12-06 MED ORDER — HYDROCORTISONE-ACETIC ACID 1-2 % OT SOLN: 3.0000 [drp] | Freq: Two times a day (BID) | OTIC | 0 refills | Status: DC

## 2019-12-06 NOTE — Patient Instructions (Signed)
° ° ° °  If you have lab work done today you will be contacted with your lab results within the next 2 weeks.  If you have not heard from us then please contact us. The fastest way to get your results is to register for My Chart. ° ° °IF you received an x-ray today, you will receive an invoice from [facility]. Please contact  Radiology at [phone] with questions or concerns regarding your invoice.  ° °IF you received labwork today, you will receive an invoice from [facility]. Please contact [facility] at [phone] with questions or concerns regarding your invoice.  ° °Our billing staff will not be able to assist you with questions regarding bills from these companies. ° °You will be contacted with the lab results as soon as they are available. The fastest way to get your results is to activate your My Chart account. Instructions are located on the last page of this paperwork. If you have not heard from us regarding the results in 2 weeks, please contact this office. °  ° ° ° °

## 2019-12-06 NOTE — Progress Notes (Signed)
Established Patient Office Visit  Subjective:  Patient ID: Paula Cervantes, female    DOB: [birth year]  Age: 44 y.o. MRN: [id number]  CC:  Chief Complaint  Patient presents with  . Transitions Of Care    Patient states she is here for a TOC patient would also like to discuss FMLA  from being depressed,    HPI Paula Cervantes presents for visit to est care  Depression: unfortunately her sister passed away on 11/26/2019. Has felt anhedonia, depression, fatigue, and anxiety since. Trouble with day to day activities and cannot focus on work. Her quality of work is suffering. It is all she can do to take care of her children at this time.  Has not been medicated for depression in the past and has no interest in this at this time. She is interested in counseling and would like for a referral to be sent.   Denies HI/SI firmly. Has good support from friends and family  Also notes cyst on tragus of R ear. Has been there for some time. occ smelly purulent drainage. Does not affect hearing. Has seen ENT who could not do much. Some itching in ear occasionally.   Histories reviewed with patient. Updated as warranted.   Past Medical History:  Diagnosis Date  . Abnormal Pap smear    no cervical cancer, just h/o cryotherapy  . Anemia   . Arthritis    knees  . Asthma    hx as a child, no problems as adult, no inhaler  . Endometriosis   . Fibroids   . Gout    knees  . Headache   . Heartburn during pregnancy   . History of hiatal hernia   . Hypothyroidism   . Lactose intolerance     Past Surgical History:  Procedure Laterality Date  . CESAREAN SECTION N/A 09/20/2012   Procedure: PRIMARY CESAREAN SECTION;  Surgeon: Margarette Asal, MD;  Location: [facility];  Service: Obstetrics;  Laterality: N/A;  Primary edc 8/25  . CESAREAN SECTION WITH BILATERAL TUBAL LIGATION Bilateral 02/09/2014   Procedure: CESAREAN SECTION WITH BILATERAL TUBAL LIGATION;  Surgeon: Logan Bores, MD;  Location: [facility];  Service: Obstetrics;  Laterality: Bilateral;  . COLONOSCOPY     polyps  . CRYOTHERAPY    . CYSTOSCOPY N/A 01/07/2018   Procedure: CYSTOSCOPY;  Surgeon: Sherlyn Hay, DO;  Location: [facility];  Service: Gynecology;  Laterality: N/A;  . DIAGNOSTIC LAPAROSCOPY    . KNEE SURGERY Bilateral    x 3 arthroscopic knee  . ovarian fibroids    . TOTAL LAPAROSCOPIC HYSTERECTOMY WITH SALPINGECTOMY Bilateral 01/07/2018   Procedure: TOTAL LAPAROSCOPIC HYSTERECTOMY WITH SALPINGECTOMY;  Surgeon: Sherlyn Hay, DO;  Location: [facility];  Service: Gynecology;  Laterality: Bilateral;  . TUBAL LIGATION  2016  . UPPER GI ENDOSCOPY    . WISDOM TOOTH EXTRACTION      Family History  Problem Relation Age of Onset  . Diabetes Mother   . Hypertension Mother   . Hyperlipidemia Mother   . Heart disease Maternal Grandmother   . Alcohol abuse Neg Hx   . Arthritis Neg Hx   . Asthma Neg Hx   . Birth defects Neg Hx   . Cancer Neg Hx   . COPD Neg Hx   . Depression Neg Hx   . Drug abuse Neg Hx   . Early death Neg Hx   . Hearing loss Neg Hx   . Kidney disease Neg Hx   .  Learning disabilities Neg Hx   . Mental illness Neg Hx   . Mental retardation Neg Hx   . Miscarriages / Stillbirths Neg Hx   . Stroke Neg Hx   . Vision loss Neg Hx   . Varicose Veins Neg Hx     Social History   Socioeconomic History  . Marital status: Married    Spouse name: Not on file  . Number of children: 2  . Years of education: Not on file  . Highest education level: Not on file  Occupational History  . Not on file  Tobacco Use  . Smoking status: Former Smoker    Packs/day: 1.00    Types: Cigarettes    Quit date: 03/27/2002    Years since quitting: 17.7  . Smokeless tobacco: Never Used  Vaping Use  . Vaping Use: Never used  Substance and Sexual Activity  . Alcohol use: No  . Drug use: No  . Sexual activity: Yes    Birth control/protection: Other-see comments    Comment: tubal ligation   Other Topics  Concern  . Not on file  Social History Narrative  . Not on file   Social Determinants of Health   Financial Resource Strain:   . Difficulty of Paying Living Expenses: Not on file  Food Insecurity:   . Worried About Charity fundraiser in the Last Year: Not on file  . Ran Out of Food in the Last Year: Not on file  Transportation Needs:   . Lack of Transportation (Medical): Not on file  . Lack of Transportation (Non-Medical): Not on file  Physical Activity:   . Days of Exercise per Week: Not on file  . Minutes of Exercise per Session: Not on file  Stress:   . Feeling of Stress : Not on file  Social Connections:   . Frequency of Communication with Friends and Family: Not on file  . Frequency of Social Gatherings with Friends and Family: Not on file  . Attends Religious Services: Not on file  . Active Member of Clubs or Organizations: Not on file  . Attends Archivist Meetings: Not on file  . Marital Status: Not on file  Intimate Partner Violence:   . Fear of Current or Ex-Partner: Not on file  . Emotionally Abused: Not on file  . Physically Abused: Not on file  . Sexually Abused: Not on file    Outpatient Medications Prior to Visit  Medication Sig Dispense Refill  . allopurinol (ZYLOPRIM) 100 MG tablet Take 1 tablet (100 mg total) by mouth daily. 90 tablet 3  . colchicine 0.6 MG tablet One three times a day as needed with food 30 tablet 3  . phentermine 30 MG capsule Take 1 capsule (30 mg total) by mouth every morning. (Patient not taking: Reported on 12/06/2019) 30 capsule 4   No facility-administered medications prior to visit.    Allergies  Allergen Reactions  . Aspirin Hives and Nausea And Vomiting  . Coconut Flavor Hives  . Other Other (See Comments)    Hi Chew Candy- caused blisters inside mouth.  . Penicillins Nausea And Vomiting and Other (See Comments)    Passed out Has patient had a PCN reaction causing immediate rash, facial/tongue/throat swelling,  SOB or lightheadedness with hypotension: unknown Has patient had a PCN reaction causing severe rash involving mucus membranes or skin necrosis: unknown Has patient had a PCN reaction that required hospitalization: unknown Has patient had a PCN reaction occurring within the last  10 years: no If all of the above answers are "NO", then may proceed with Cephalosporin use.   . Sulfa Antibiotics Other (See Comments)    Pt states that this medication causes blisters in mouth and throat.     ROS Review of Systems Per hpi    Objective:    Physical Exam Vitals and nursing note reviewed.  Constitutional:      General: She is not in acute distress.    Appearance: Normal appearance. She is normal weight. She is not ill-appearing, toxic-appearing or diaphoretic.  HENT:     Right Ear: Tympanic membrane, ear canal and external ear normal. There is no impacted cerumen.     Left Ear: Tympanic membrane, ear canal and external ear normal. There is no impacted cerumen.  Cardiovascular:     Rate and Rhythm: Normal rate and regular rhythm.     Heart sounds: Normal heart sounds. No murmur heard.  No friction rub. No gallop.   Pulmonary:     Effort: Pulmonary effort is normal. No respiratory distress.     Breath sounds: Normal breath sounds. No stridor. No wheezing, rhonchi or rales.  Chest:     Chest wall: No tenderness.  Skin:    General: Skin is warm and dry.     Coloration: Skin is not jaundiced or pale.     Findings: Lesion (small cyst anterior to R ear) present. No bruising, erythema or rash.  Neurological:     General: No focal deficit present.     Mental Status: She is alert and oriented to person, place, and time. Mental status is at baseline.  Psychiatric:        Mood and Affect: Mood normal.        Behavior: Behavior normal.        Thought Content: Thought content normal.        Judgment: Judgment normal.     BP 116/77   Pulse 81   Temp 98.3 F (36.8 C) (Temporal)   Resp 18    Ht 5\' 7"  (1.702 m)   Wt 239 lb 9.6 oz (108.7 kg)   SpO2 95%   BMI 37.53 kg/m  Wt Readings from Last 3 Encounters:  12/06/19 239 lb 9.6 oz (108.7 kg)  10/25/18 242 lb 9 oz (110 kg)  01/07/18 230 lb (104.3 kg)     There are no preventive care reminders to display for this patient.  There are no preventive care reminders to display for this patient.  No results found for: TSH Lab Results  Component Value Date   WBC 4.6 10/25/2018   HGB 13.0 10/25/2018   HCT 39.3 10/25/2018   MCV 89 10/25/2018   PLT 331 10/25/2018   Lab Results  Component Value Date   NA 140 10/25/2018   K 4.1 10/25/2018   CO2 23 10/25/2018   GLUCOSE 107 (H) 10/25/2018   BUN 9 10/25/2018   CREATININE 0.97 10/25/2018   BILITOT 0.3 10/25/2018   ALKPHOS 64 10/25/2018   AST 12 10/25/2018   ALT 14 10/25/2018   PROT 6.5 10/25/2018   ALBUMIN 3.8 10/25/2018   CALCIUM 9.2 10/25/2018   ANIONGAP 8 05/05/2014   No results found for: CHOL No results found for: HDL No results found for: LDLCALC No results found for: TRIG No results found for: CHOLHDL Lab Results  Component Value Date   HGBA1C 6.3 (H) 10/25/2018      Assessment & Plan:   Problem List Items Addressed This Visit  Other   Fatigue - Primary   Relevant Orders   CBC With Differential   Comprehensive metabolic panel   Thyroid Panel With TSH   Hemoglobin A1c    Other Visit Diagnoses    History of hypothyroidism       Relevant Orders   Thyroid Panel With TSH   Lipid screening       Relevant Orders   Lipid panel   Reactive depression       Relevant Orders   Ambulatory referral to Psychology   Unresolved grief       Relevant Orders   Ambulatory referral to Psychology   Skin infection       Relevant Medications   doxycycline (VIBRA-TABS) 100 MG tablet   Infective otitis externa of right ear       Relevant Medications   acetic acid-hydrocortisone (VOSOL-HC) OTIC solution      Meds ordered this encounter  Medications  .  doxycycline (VIBRA-TABS) 100 MG tablet    Sig: Take 1 tablet (100 mg total) by mouth 2 (two) times daily.    Dispense:  14 tablet    Refill:  0    Order Specific Question:   Supervising Provider    Answer:   Carlota Raspberry, JEFFREY R [[id number]]  . acetic acid-hydrocortisone (VOSOL-HC) OTIC solution    Sig: Place 3 drops into the right ear 2 (two) times daily.    Dispense:  10 mL    Refill:  0    Order Specific Question:   Supervising Provider    Answer:   Carlota Raspberry, JEFFREY R [[id number]]    Follow-up: No follow-ups on file.   PLAN  Doxycycline 100mg  PO bid and vosol hc for ear abnormalities. Refer to derm for excision of cyst.   Refer to counseling for depression and unresolved grief. Patient would be good candidate for temporary leave from work via [employer] for this cause. Suggest 3-4 weeks off and part time return for next 1-2 weeks. Pt in agreement  Histories reviewed and updated with patient  Encourage medication for depression and anxiety but patient does not feel this is the right time- will return if she feels this is needed  Patient encouraged to call clinic with any questions, comments, or concerns.  I spent 46 minutes with this patient, more than 50% of which was spent counseling and/or educating.  Maximiano Coss, NP

## 2019-12-07 LAB — CBC WITH DIFFERENTIAL
Basophils Absolute: 0 10*3/uL (ref 0.0–0.2)
Basos: 1 %
EOS (ABSOLUTE): 0.2 10*3/uL (ref 0.0–0.4)
Eos: 5 %
Hematocrit: 40.5 % (ref 34.0–46.6)
Hemoglobin: 13.2 g/dL (ref 11.1–15.9)
Immature Grans (Abs): 0 10*3/uL (ref 0.0–0.1)
Immature Granulocytes: 0 %
Lymphocytes Absolute: 2.2 10*3/uL (ref 0.7–3.1)
Lymphs: 42 %
MCH: 30.1 pg (ref 26.6–33.0)
MCHC: 32.6 g/dL (ref 31.5–35.7)
MCV: 93 fL (ref 79–97)
Monocytes Absolute: 0.7 10*3/uL (ref 0.1–0.9)
Monocytes: 12 %
Neutrophils Absolute: 2.1 10*3/uL (ref 1.4–7.0)
Neutrophils: 40 %
RBC: 4.38 x10E6/uL (ref 3.77–5.28)
RDW: 11.8 % (ref 11.7–15.4)
WBC: 5.2 10*3/uL (ref 3.4–10.8)

## 2019-12-07 LAB — COMPREHENSIVE METABOLIC PANEL
ALT: 13 IU/L (ref 0–32)
AST: 11 IU/L (ref 0–40)
Albumin/Globulin Ratio: 1.6 (ref 1.2–2.2)
Albumin: 4.1 g/dL (ref 3.8–4.8)
Alkaline Phosphatase: 61 IU/L (ref 44–121)
BUN/Creatinine Ratio: 10 (ref 9–23)
BUN: 9 mg/dL (ref 6–24)
Bilirubin Total: 0.2 mg/dL (ref 0.0–1.2)
CO2: 24 mmol/L (ref 20–29)
Calcium: 9.1 mg/dL (ref 8.7–10.2)
Chloride: 103 mmol/L (ref 96–106)
Creatinine, Ser: 0.88 mg/dL (ref 0.57–1.00)
GFR calc Af Amer: 93 mL/min/{1.73_m2} (ref >59)
GFR calc non Af Amer: 81 mL/min/{1.73_m2} (ref >59)
Globulin, Total: 2.6 g/dL (ref 1.5–4.5)
Glucose: 92 mg/dL (ref 65–99)
Potassium: 4.3 mmol/L (ref 3.5–5.2)
Sodium: 138 mmol/L (ref 134–144)
Total Protein: 6.7 g/dL (ref 6.0–8.5)

## 2019-12-07 LAB — LIPID PANEL
Chol/HDL Ratio: 5.1 ratio — ABNORMAL HIGH (ref 0.0–4.4)
Cholesterol, Total: 197 mg/dL (ref 100–199)
HDL: 39 mg/dL — ABNORMAL LOW (ref >39)
LDL Chol Calc (NIH): 126 mg/dL — ABNORMAL HIGH (ref 0–99)
Triglycerides: 180 mg/dL — ABNORMAL HIGH (ref 0–149)
VLDL Cholesterol Cal: 32 mg/dL (ref 5–40)

## 2019-12-07 LAB — HEMOGLOBIN A1C
Est. average glucose Bld gHb Est-mCnc: 128 mg/dL
Hgb A1c MFr Bld: 6.1 % — ABNORMAL HIGH (ref 4.8–5.6)

## 2019-12-07 LAB — THYROID PANEL WITH TSH
Free Thyroxine Index: 1.4 (ref 1.2–4.9)
T3 Uptake Ratio: 23 % — ABNORMAL LOW (ref 24–39)
T4, Total: 5.9 ug/dL (ref 4.5–12.0)
TSH: 7.93 u[IU]/mL — ABNORMAL HIGH (ref 0.450–4.500)

## 2019-12-15 ENCOUNTER — Ambulatory Visit
Admission: EM | Admit: 2019-12-15 | Discharge: 2019-12-15 | Disposition: A | Discharge status: Home / Self Care | Payer: BC Managed Care – PPO | Attending: Emergency Medicine | Admitting: Emergency Medicine

## 2019-12-15 ENCOUNTER — Encounter: Payer: Self-pay | Admitting: Emergency Medicine

## 2019-12-15 ENCOUNTER — Other Ambulatory Visit: Payer: Self-pay

## 2019-12-15 DIAGNOSIS — Z20822 Contact with and (suspected) exposure to covid-19: Secondary | ICD-10-CM | POA: Diagnosis not present

## 2019-12-15 DIAGNOSIS — J029 Acute pharyngitis, unspecified: Secondary | ICD-10-CM | POA: Insufficient documentation

## 2019-12-15 LAB — POCT RAPID STREP A (OFFICE): Rapid Strep A Screen: NEGATIVE

## 2019-12-15 MED ORDER — LIDOCAINE VISCOUS HCL 2 % MT SOLN: 15.0000 mL | OROMUCOSAL | 0 refills | Status: DC | PRN

## 2019-12-15 NOTE — ED Provider Notes (Addendum)
[facility]    CSN: [id number] Arrival date & time: 12/15/19  1453      History   Chief Complaint Chief Complaint  Patient presents with  . Sore Throat    HPI Paula Cervantes is a 44 y.o. female  History was provided by the patient. Paula Cervantes is a 44 y.o. female who presents for evaluation of a sore throat. Associated symptoms include nasal blockage, post nasal drip, sinus and nasal congestion and sore throat. Onset of symptoms was 3 days ago, unchanged since that time.  She is drinking plenty of fluids. She has not had recent close exposure to someone with proven streptococcal pharyngitis. The following portions of the patient's history were reviewed and updated as appropriate: allergies, current medications, past family history, past medical history, past social history, past surgical history and problem list.     Past Medical History:  Diagnosis Date  . Abnormal Pap smear    no cervical cancer, just h/o cryotherapy  . Anemia   . Arthritis    knees  . Asthma    hx as a child, no problems as adult, no inhaler  . Endometriosis   . Fibroids   . Gout    knees  . Headache   . Heartburn during pregnancy   . History of hiatal hernia   . Hypothyroidism   . Lactose intolerance     Patient Active Problem List   Diagnosis Date Noted  . Pre-diabetes 10/26/2018  . Dysmenorrhea 01/07/2018  . Status post laparoscopic hysterectomy 01/07/2018  . Iron deficiency anemia due to chronic blood loss 10/07/2017  . DUB (dysfunctional uterine bleeding) 10/07/2017  . Fatigue 10/07/2017  . History of colonic polyps 10/07/2017  . Migraine 08/23/2014  . S/P repeat low transverse C-section 02/09/2014    Past Surgical History:  Procedure Laterality Date  . CESAREAN SECTION N/A 09/20/2012   Procedure: PRIMARY CESAREAN SECTION;  Surgeon: Margarette Asal, MD;  Location: [facility];  Service: Obstetrics;  Laterality: N/A;  Primary edc 8/25  . CESAREAN SECTION WITH BILATERAL  TUBAL LIGATION Bilateral 02/09/2014   Procedure: CESAREAN SECTION WITH BILATERAL TUBAL LIGATION;  Surgeon: Logan Bores, MD;  Location: [facility];  Service: Obstetrics;  Laterality: Bilateral;  . COLONOSCOPY     polyps  . CRYOTHERAPY    . CYSTOSCOPY N/A 01/07/2018   Procedure: CYSTOSCOPY;  Surgeon: Sherlyn Hay, DO;  Location: [facility];  Service: Gynecology;  Laterality: N/A;  . DIAGNOSTIC LAPAROSCOPY    . KNEE SURGERY Bilateral    x 3 arthroscopic knee  . ovarian fibroids    . TOTAL LAPAROSCOPIC HYSTERECTOMY WITH SALPINGECTOMY Bilateral 01/07/2018   Procedure: TOTAL LAPAROSCOPIC HYSTERECTOMY WITH SALPINGECTOMY;  Surgeon: Sherlyn Hay, DO;  Location: [facility];  Service: Gynecology;  Laterality: Bilateral;  . TUBAL LIGATION  2016  . UPPER GI ENDOSCOPY    . WISDOM TOOTH EXTRACTION      OB History    Gravida  3   Para  2   Term  2   Preterm      AB  1   Living  2     SAB  1   TAB      Ectopic      Multiple  0   Live Births  2            Home Medications    Prior to Admission medications   Medication Sig Start Date End Date Taking? Authorizing Provider  acetic acid-hydrocortisone (VOSOL-HC)  OTIC solution Place 3 drops into the right ear 2 (two) times daily. 12/06/19   Maximiano Coss, NP  allopurinol (ZYLOPRIM) 100 MG tablet Take 1 tablet (100 mg total) by mouth daily. 05/15/18   [name], MD  colchicine 0.6 MG tablet One three times a day as needed with food 05/15/18   [name], MD  lidocaine (XYLOCAINE) 2 % solution Use as directed 15 mLs in the mouth or throat as needed for mouth pain. 12/15/19   Hall-Potvin, [place], PA-C    Family History Family History  Problem Relation Age of Onset  . Diabetes Mother   . Hypertension Mother   . Hyperlipidemia Mother   . Heart disease Maternal Grandmother   . Alcohol abuse Neg Hx   . Arthritis Neg Hx   . Asthma Neg Hx   . Birth defects Neg Hx   . Cancer Neg Hx   . COPD Neg Hx   .  Depression Neg Hx   . Drug abuse Neg Hx   . Early death Neg Hx   . Hearing loss Neg Hx   . Kidney disease Neg Hx   . Learning disabilities Neg Hx   . Mental illness Neg Hx   . Mental retardation Neg Hx   . Miscarriages / Stillbirths Neg Hx   . Stroke Neg Hx   . Vision loss Neg Hx   . Varicose Veins Neg Hx     Social History Social History   Tobacco Use  . Smoking status: Former Smoker    Packs/day: 1.00    Types: Cigarettes    Quit date: 03/27/2002    Years since quitting: 17.7  . Smokeless tobacco: Never Used  Vaping Use  . Vaping Use: Never used  Substance Use Topics  . Alcohol use: No  . Drug use: No     Allergies   Aspirin, Coconut flavor, Other, Penicillins, and Sulfa antibiotics   Review of Systems Review of Systems  Constitutional: Negative for activity change, appetite change, fatigue and fever.  HENT: Positive for congestion, postnasal drip and sore throat. Negative for ear pain, sinus pain and voice change.   Eyes: Negative for pain, redness and visual disturbance.  Respiratory: Negative for cough, chest tightness, shortness of breath and wheezing.   Cardiovascular: Negative for chest pain and palpitations.  Gastrointestinal: Negative for abdominal pain, diarrhea and vomiting.  Musculoskeletal: Negative for arthralgias and myalgias.  Skin: Negative for rash and wound.  Neurological: Negative for syncope and headaches.     Physical Exam Triage Vital Signs ED Triage Vitals  Enc Vitals Group     BP 12/15/19 1502 136/63     Pulse Rate 12/15/19 1502 (!) 109     Resp 12/15/19 1502 18     Temp 12/15/19 1502 98.7 F (37.1 C)     Temp Source 12/15/19 1502 Oral     SpO2 12/15/19 1502 98 %     Weight --      Height --      Head Circumference --      Peak Flow --      Pain Score 12/15/19 1503 8     Pain Loc --      Pain Edu? --      Excl. in [place]? --    No data found.  Updated Vital Signs BP 136/63 (BP Location: Left Arm)   Pulse (!) 109   Temp  98.7 F (37.1 C) (Oral)   Resp 18   SpO2 98%  Visual Acuity Right Eye Distance:   Left Eye Distance:   Bilateral Distance:    Right Eye Near:   Left Eye Near:    Bilateral Near:     Physical Exam Constitutional:      General: She is not in acute distress.    Appearance: She is well-developed. She is not ill-appearing or diaphoretic.  HENT:     Head: Normocephalic and atraumatic.     Right Ear: Tympanic membrane and ear canal normal.     Left Ear: Tympanic membrane and ear canal normal.     Mouth/Throat:     Mouth: Mucous membranes are moist.     Pharynx: Oropharynx is clear. Uvula midline. No oropharyngeal exudate, posterior oropharyngeal erythema or uvula swelling.     Tonsils: No tonsillar exudate. 1+ on the right. 1+ on the left.  Eyes:     General: No scleral icterus.    Conjunctiva/sclera: Conjunctivae normal.     Pupils: Pupils are equal, round, and reactive to light.  Neck:     Comments: Trachea midline, negative JVD Cardiovascular:     Rate and Rhythm: Normal rate and regular rhythm.     Heart sounds: No murmur heard.  No gallop.   Pulmonary:     Effort: Pulmonary effort is normal. No respiratory distress.     Breath sounds: No wheezing, rhonchi or rales.  Musculoskeletal:     Cervical back: Neck supple. No tenderness.  Lymphadenopathy:     Cervical: No cervical adenopathy.  Skin:    Capillary Refill: Capillary refill takes less than 2 seconds.     Coloration: Skin is not jaundiced or pale.     Findings: No rash.  Neurological:     General: No focal deficit present.     Mental Status: She is alert and oriented to person, place, and time.      UC Treatments / Results  Labs (all labs ordered are listed, but only abnormal results are displayed) Labs Reviewed  NOVEL CORONAVIRUS, NAA  CULTURE, GROUP A STREP [facility])  POCT RAPID STREP A (OFFICE)    EKG   Radiology No results found.  Procedures Procedures (including critical care  time)  Medications Ordered in UC Medications - No data to display  Initial Impression / Assessment and Plan / UC Course  I have reviewed the triage vital signs and the nursing notes.  Pertinent labs & imaging results that were available during my care of the patient were reviewed by me and considered in my medical decision making (see chart for details).     Patient afebrile, nontoxic, with SpO2 98%.  Rapid strep negative, culture pending.  Covid PCR pending.  Patient to quarantine until results are back.  We will treat supportively as outlined below.  Return precautions discussed, patient verbalized understanding and is agreeable to plan. Final Clinical Impressions(s) / UC Diagnoses   Final diagnoses:  Encounter for screening laboratory testing for COVID-19 virus  Sore throat     Discharge Instructions     Your rapid strep test was negative today.  The culture is pending.  Please look on your MyChart for test results.   We will notify you if the culture positive and outline a treatment plan at that time.   Please continue Tylenol and/or Ibuprofen as needed for fever, pain.  May try warm salt water gargles, cepacol lozenges, throat spray, warm tea or water with lemon/honey, or OTC cold relief medicine for throat discomfort.  May gargle, spit viscous lidocaine  as prescribed for additional relief.  (Please note this may cause the back of your tongue and mouth to be numb as well)  For congestion: take a daily anti-histamine like Zyrtec, Claritin, and a oral decongestant to help with post nasal drip that may be irritating your throat.   It is important to stay hydrated: drink plenty of fluids (primarily water) to keep your throat moisturized and help further relieve irritation/discomfort.     ED Prescriptions    Medication Sig Dispense Auth. Provider   lidocaine (XYLOCAINE) 2 % solution  (Status: Discontinued) Use as directed 15 mLs in the mouth or throat as needed for mouth pain.  100 mL Hall-Potvin, [place], PA-C   lidocaine (XYLOCAINE) 2 % solution Use as directed 15 mLs in the mouth or throat as needed for mouth pain. 100 mL Hall-Potvin, [place], PA-C     PDMP not reviewed this encounter.   Hall-Potvin, [place], PA-C 12/15/19 1606    Hall-Potvin, [place], [place] 12/15/19 1609

## 2019-12-15 NOTE — Discharge Instructions (Signed)
Your rapid strep test was negative today.  The culture is pending.  Please look on your MyChart for test results.   We will notify you if the culture positive and outline a treatment plan at that time.   Please continue Tylenol and/or Ibuprofen as needed for fever, pain.  May try warm salt water gargles, cepacol lozenges, throat spray, warm tea or water with lemon/honey, or OTC cold relief medicine for throat discomfort.  May gargle, spit viscous lidocaine as prescribed for additional relief.  (Please note this may cause the back of your tongue and mouth to be numb as well)  For congestion: take a daily anti-histamine like Zyrtec, Claritin, and a oral decongestant to help with post nasal drip that may be irritating your throat.   It is important to stay hydrated: drink plenty of fluids (primarily water) to keep your throat moisturized and help further relieve irritation/discomfort.  

## 2019-12-15 NOTE — ED Triage Notes (Signed)
Pt here for sore throat x 3 days; pt sts fever today

## 2019-12-16 LAB — SARS-COV-2, NAA 2 DAY TAT

## 2019-12-16 LAB — NOVEL CORONAVIRUS, NAA: SARS-CoV-2, NAA: NOT DETECTED

## 2019-12-19 LAB — CULTURE, GROUP A STREP (THRC)

## 2019-12-20 ENCOUNTER — Ambulatory Visit (INDEPENDENT_AMBULATORY_CARE_PROVIDER_SITE_OTHER): Payer: BC Managed Care – PPO | Admitting: Psychology

## 2019-12-20 DIAGNOSIS — F4321 Adjustment disorder with depressed mood: Secondary | ICD-10-CM | POA: Diagnosis not present

## 2019-12-27 ENCOUNTER — Ambulatory Visit (INDEPENDENT_AMBULATORY_CARE_PROVIDER_SITE_OTHER): Payer: BC Managed Care – PPO | Admitting: Psychology

## 2019-12-27 DIAGNOSIS — F4321 Adjustment disorder with depressed mood: Secondary | ICD-10-CM

## 2020-01-04 ENCOUNTER — Ambulatory Visit (INDEPENDENT_AMBULATORY_CARE_PROVIDER_SITE_OTHER): Payer: BC Managed Care – PPO | Admitting: Psychology

## 2020-01-04 ENCOUNTER — Other Ambulatory Visit: Payer: Self-pay

## 2020-01-04 ENCOUNTER — Telehealth: Payer: Self-pay | Admitting: Registered Nurse

## 2020-01-04 ENCOUNTER — Encounter: Payer: Self-pay | Admitting: Registered Nurse

## 2020-01-04 ENCOUNTER — Ambulatory Visit: Payer: BC Managed Care – PPO | Admitting: Registered Nurse

## 2020-01-04 VITALS — BP 127/85 | HR 89 | Temp 98.0°F | Resp 18 | Ht 67.0 in | Wt 241.4 lb

## 2020-01-04 DIAGNOSIS — F5105 Insomnia due to other mental disorder: Secondary | ICD-10-CM

## 2020-01-04 DIAGNOSIS — G479 Sleep disorder, unspecified: Secondary | ICD-10-CM

## 2020-01-04 DIAGNOSIS — F418 Other specified anxiety disorders: Secondary | ICD-10-CM | POA: Diagnosis not present

## 2020-01-04 DIAGNOSIS — F4321 Adjustment disorder with depressed mood: Secondary | ICD-10-CM | POA: Diagnosis not present

## 2020-01-04 DIAGNOSIS — Z6838 Body mass index (BMI) 38.0-38.9, adult: Secondary | ICD-10-CM

## 2020-01-04 MED ORDER — TRAZODONE HCL 50 MG PO TABS: 25.0000 mg | ORAL_TABLET | Freq: Every evening | ORAL | 3 refills | Status: DC | PRN

## 2020-01-04 MED ORDER — FLUOXETINE HCL 10 MG PO CAPS: 10.0000 mg | ORAL_CAPSULE | Freq: Every day | ORAL | 0 refills | Status: DC

## 2020-01-04 MED ORDER — PHENTERMINE HCL 30 MG PO CAPS: 30.0000 mg | ORAL_CAPSULE | ORAL | 0 refills | Status: DC

## 2020-01-04 NOTE — Patient Instructions (Signed)
° ° ° °  If you have lab work done today you will be contacted with your lab results within the next 2 weeks.  If you have not heard from us then please contact us. The fastest way to get your results is to register for My Chart. ° ° °IF you received an x-ray today, you will receive an invoice from [facility]. Please contact [facility] at [phone] with questions or concerns regarding your invoice.  ° °IF you received labwork today, you will receive an invoice from [facility]. Please contact [facility] at [phone] with questions or concerns regarding your invoice.  ° °Our billing staff will not be able to assist you with questions regarding bills from these companies. ° °You will be contacted with the lab results as soon as they are available. The fastest way to get your results is to activate your My Chart account. Instructions are located on the last page of this paperwork. If you have not heard from us regarding the results in 2 weeks, please contact this office. °  ° ° ° °

## 2020-01-04 NOTE — Telephone Encounter (Signed)
Patient seen by provider today and given 3 prescriptions. Two of the three were filled. Patient called for status of weight loss medication. Spoke with nurse Dietrich Pates who put in a note for provider and said he will fill it today if that's what he promised her. Please advise at [phone]

## 2020-01-05 NOTE — Telephone Encounter (Signed)
Spoke with pt, she will chk the pharmacy for receipt of diet meds.

## 2020-01-09 ENCOUNTER — Encounter: Payer: Self-pay | Admitting: *Deleted

## 2020-01-13 ENCOUNTER — Ambulatory Visit: Payer: BC Managed Care – PPO | Admitting: Psychology

## 2020-01-16 ENCOUNTER — Ambulatory Visit (INDEPENDENT_AMBULATORY_CARE_PROVIDER_SITE_OTHER): Payer: BC Managed Care – PPO | Admitting: Psychology

## 2020-01-16 DIAGNOSIS — F4321 Adjustment disorder with depressed mood: Secondary | ICD-10-CM

## 2020-01-23 ENCOUNTER — Ambulatory Visit (INDEPENDENT_AMBULATORY_CARE_PROVIDER_SITE_OTHER): Payer: BC Managed Care – PPO | Admitting: Psychology

## 2020-01-23 DIAGNOSIS — F4321 Adjustment disorder with depressed mood: Secondary | ICD-10-CM

## 2020-02-01 ENCOUNTER — Ambulatory Visit (HOSPITAL_COMMUNITY)
Admission: EM | Admit: 2020-02-01 | Discharge: 2020-02-01 | Disposition: A | Discharge status: Home / Self Care | Payer: BC Managed Care – PPO | Attending: Internal Medicine | Admitting: Internal Medicine

## 2020-02-01 ENCOUNTER — Encounter (HOSPITAL_COMMUNITY): Payer: Self-pay | Admitting: Emergency Medicine

## 2020-02-01 ENCOUNTER — Other Ambulatory Visit: Payer: Self-pay

## 2020-02-01 DIAGNOSIS — Z86010 Personal history of colonic polyps: Secondary | ICD-10-CM | POA: Insufficient documentation

## 2020-02-01 DIAGNOSIS — Z886 Allergy status to analgesic agent status: Secondary | ICD-10-CM | POA: Diagnosis not present

## 2020-02-01 DIAGNOSIS — Z283 Underimmunization status: Secondary | ICD-10-CM | POA: Diagnosis not present

## 2020-02-01 DIAGNOSIS — Z880 Allergy status to penicillin: Secondary | ICD-10-CM | POA: Insufficient documentation

## 2020-02-01 DIAGNOSIS — R059 Cough, unspecified: Secondary | ICD-10-CM | POA: Diagnosis not present

## 2020-02-01 DIAGNOSIS — U071 COVID-19: Secondary | ICD-10-CM | POA: Diagnosis not present

## 2020-02-01 DIAGNOSIS — B349 Viral infection, unspecified: Secondary | ICD-10-CM | POA: Diagnosis not present

## 2020-02-01 DIAGNOSIS — Z882 Allergy status to sulfonamides status: Secondary | ICD-10-CM | POA: Diagnosis not present

## 2020-02-01 DIAGNOSIS — Z79899 Other long term (current) drug therapy: Secondary | ICD-10-CM | POA: Diagnosis not present

## 2020-02-01 DIAGNOSIS — Z87891 Personal history of nicotine dependence: Secondary | ICD-10-CM | POA: Diagnosis not present

## 2020-02-01 LAB — RESP PANEL BY RT-PCR (FLU A&B, COVID) ARPGX2
Influenza A by PCR: NEGATIVE
Influenza B by PCR: NEGATIVE
SARS Coronavirus 2 by RT PCR: POSITIVE — AB

## 2020-02-01 MED ORDER — HYDROCOD POLST-CPM POLST ER 10-8 MG/5ML PO SUER: 5.0000 mL | Freq: Two times a day (BID) | ORAL | 0 refills | Status: DC | PRN

## 2020-02-01 MED ORDER — PROMETHAZINE HCL 25 MG PO TABS: 25.0000 mg | ORAL_TABLET | Freq: Four times a day (QID) | ORAL | 0 refills | Status: DC | PRN

## 2020-02-01 MED ORDER — BENZONATATE 100 MG PO CAPS: 100.0000 mg | ORAL_CAPSULE | Freq: Three times a day (TID) | ORAL | 0 refills | Status: DC

## 2020-02-01 NOTE — ED Provider Notes (Signed)
[facility]    CSN: [id number] Arrival date & time: 02/01/20  1645      History   Chief Complaint Chief Complaint  Patient presents with  . URI    HPI Paula Cervantes is a 44 y.o. female comes to the urgent care with 1 day history of headache, generalized body aches, sore throat, cough not productive of sputum.  Patient denies any fever but admits to having some chills.  No shortness of breath or wheezing.  No abdominal pain nausea or vomiting.  Patient is not vaccinated against COVID-19 virus.  Patient endorses positive sick contacts at home   HPI  Past Medical History:  Diagnosis Date  . Abnormal Pap smear    no cervical cancer, just h/o cryotherapy  . Anemia   . Arthritis    knees  . Asthma    hx as a child, no problems as adult, no inhaler  . Endometriosis   . Fibroids   . Gout    knees  . Headache   . Heartburn during pregnancy   . History of hiatal hernia   . Hypothyroidism   . Lactose intolerance     Patient Active Problem List   Diagnosis Date Noted  . Pre-diabetes 10/26/2018  . Dysmenorrhea 01/07/2018  . Status post laparoscopic hysterectomy 01/07/2018  . Iron deficiency anemia due to chronic blood loss 10/07/2017  . DUB (dysfunctional uterine bleeding) 10/07/2017  . Fatigue 10/07/2017  . History of colonic polyps 10/07/2017  . Migraine 08/23/2014  . S/P repeat low transverse C-section 02/09/2014    Past Surgical History:  Procedure Laterality Date  . CESAREAN SECTION N/A 09/20/2012   Procedure: PRIMARY CESAREAN SECTION;  Surgeon: Meriel Pica, MD;  Location: [facility];  Service: Obstetrics;  Laterality: N/A;  Primary edc 8/25  . CESAREAN SECTION WITH BILATERAL TUBAL LIGATION Bilateral 02/09/2014   Procedure: CESAREAN SECTION WITH BILATERAL TUBAL LIGATION;  Surgeon: Oliver Pila, MD;  Location: [facility];  Service: Obstetrics;  Laterality: Bilateral;  . COLONOSCOPY     polyps  . CRYOTHERAPY    . CYSTOSCOPY N/A 01/07/2018    Procedure: CYSTOSCOPY;  Surgeon: Edwinna Areola, DO;  Location: [facility];  Service: Gynecology;  Laterality: N/A;  . DIAGNOSTIC LAPAROSCOPY    . KNEE SURGERY Bilateral    x 3 arthroscopic knee  . ovarian fibroids    . TOTAL LAPAROSCOPIC HYSTERECTOMY WITH SALPINGECTOMY Bilateral 01/07/2018   Procedure: TOTAL LAPAROSCOPIC HYSTERECTOMY WITH SALPINGECTOMY;  Surgeon: Edwinna Areola, DO;  Location: [facility];  Service: Gynecology;  Laterality: Bilateral;  . TUBAL LIGATION  2016  . UPPER GI ENDOSCOPY    . WISDOM TOOTH EXTRACTION      OB History    Gravida  3   Para  2   Term  2   Preterm      AB  1   Living  2     SAB  1   IAB      Ectopic      Multiple  0   Live Births  2            Home Medications    Prior to Admission medications   Medication Sig Start Date End Date Taking? Authorizing Provider  benzonatate (TESSALON) 100 MG capsule Take 1 capsule (100 mg total) by mouth every 8 (eight) hours. 02/01/20  Yes Lynnmarie Lovett, Britta Mccreedy, MD  chlorpheniramine-HYDROcodone [facility] PENNKINETIC ER) 10-8 MG/5ML SUER Take 5 mLs by mouth every 12 (twelve) hours  as needed for cough. 02/01/20  Yes Jamye Balicki, Myrene Galas, MD  promethazine (PHENERGAN) 25 MG tablet Take 1 tablet (25 mg total) by mouth every 6 (six) hours as needed for nausea or vomiting. 02/01/20  Yes Gwendolyne Welford, Myrene Galas, MD  acetic acid-hydrocortisone (VOSOL-HC) OTIC solution Place 3 drops into the right ear 2 (two) times daily. 12/06/19   Maximiano Coss, NP  allopurinol (ZYLOPRIM) 100 MG tablet Take 1 tablet (100 mg total) by mouth daily. 05/15/18   [name], MD  colchicine 0.6 MG tablet One three times a day as needed with food 05/15/18   [name], MD  FLUoxetine (PROZAC) 10 MG capsule Take 1 capsule (10 mg total) by mouth daily. After 2 weeks, increase to 2 capsules (20mg  total) by mouth daily. Take in the morning. 01/04/20   Maximiano Coss, NP  phentermine 30 MG capsule Take 1 capsule (30 mg total) by  mouth every morning. 01/04/20   Maximiano Coss, NP  traZODone (DESYREL) 50 MG tablet Take 0.5-1 tablets (25-50 mg total) by mouth at bedtime as needed for sleep. 01/04/20   Maximiano Coss, NP    Family History Family History  Problem Relation Age of Onset  . Diabetes Mother   . Hypertension Mother   . Hyperlipidemia Mother   . Heart disease Maternal Grandmother   . Alcohol abuse Neg Hx   . Arthritis Neg Hx   . Asthma Neg Hx   . Birth defects Neg Hx   . Cancer Neg Hx   . COPD Neg Hx   . Depression Neg Hx   . Drug abuse Neg Hx   . Early death Neg Hx   . Hearing loss Neg Hx   . Kidney disease Neg Hx   . Learning disabilities Neg Hx   . Mental illness Neg Hx   . Mental retardation Neg Hx   . Miscarriages / Stillbirths Neg Hx   . Stroke Neg Hx   . Vision loss Neg Hx   . Varicose Veins Neg Hx     Social History Social History   Tobacco Use  . Smoking status: Former Smoker    Packs/day: 1.00    Types: Cigarettes    Quit date: 03/27/2002    Years since quitting: 17.8  . Smokeless tobacco: Never Used  Vaping Use  . Vaping Use: Never used  Substance Use Topics  . Alcohol use: No  . Drug use: No     Allergies   Aspirin, Coconut flavor, Other, Penicillins, and Sulfa antibiotics   Review of Systems Review of Systems  Constitutional: Positive for chills, fatigue and fever.  HENT: Positive for congestion and sore throat.   Cardiovascular: Positive for chest pain.  Gastrointestinal: Positive for nausea. Negative for vomiting.  Musculoskeletal: Positive for myalgias.  Neurological: Positive for headaches.     Physical Exam Triage Vital Signs ED Triage Vitals  Enc Vitals Group     BP 02/01/20 1801 127/83     Pulse Rate 02/01/20 1801 (!) 106     Resp 02/01/20 1801 20     Temp 02/01/20 1801 99.5 F (37.5 C)     Temp Source 02/01/20 1801 Oral     SpO2 02/01/20 1801 96 %     Weight --      Height --      Head Circumference --      Peak Flow --      Pain Score  02/01/20 1759 8     Pain Loc --  Pain Edu? --      Excl. in [place]? --    No data found.  Updated Vital Signs BP 127/83 (BP Location: Right Arm)   Pulse (!) 106   Temp 99.5 F (37.5 C) (Oral)   Resp 20   LMP 01/07/2018   SpO2 96%   Visual Acuity Right Eye Distance:   Left Eye Distance:   Bilateral Distance:    Right Eye Near:   Left Eye Near:    Bilateral Near:     Physical Exam Constitutional:      General: She is not in acute distress.    Appearance: She is ill-appearing.  HENT:     Mouth/Throat:     Pharynx: Posterior oropharyngeal erythema present.  Cardiovascular:     Rate and Rhythm: Normal rate and regular rhythm.     Pulses: Normal pulses.     Heart sounds: Normal heart sounds.  Pulmonary:     Effort: Pulmonary effort is normal. No respiratory distress.     Breath sounds: No stridor. No wheezing or rhonchi.  Musculoskeletal:        General: Normal range of motion.  Neurological:     Mental Status: She is alert.      UC Treatments / Results  Labs (all labs ordered are listed, but only abnormal results are displayed) Labs Reviewed  RESP PANEL BY RT-PCR (FLU A&B, COVID) [id number]    EKG   Radiology No results found.  Procedures Procedures (including critical care time)  Medications Ordered in UC Medications - No data to display  Initial Impression / Assessment and Plan / UC Course  I have reviewed the triage vital signs and the nursing notes.  Pertinent labs & imaging results that were available during my care of the patient were reviewed by me and considered in my medical decision making (see chart for details).     1.  Acute viral syndrome: Respiratory PCR for Covid, flu A+ B Phenergan as needed for nausea/vomiting Tussionex as needed for cough Increase oral fluid intake Ibuprofen 600 mg as needed for generalized body aches Return precautions given Quarantine until COVID-19 test results are available.  Final Clinical Impressions(s)  / UC Diagnoses   Final diagnoses:  Acute viral syndrome     Discharge Instructions     Increase oral fluid intake Take medications as directed If you have any worsening symptoms please return to the urgent care to be reevaluated.   ED Prescriptions    Medication Sig Dispense Auth. Provider   promethazine (PHENERGAN) 25 MG tablet Take 1 tablet (25 mg total) by mouth every 6 (six) hours as needed for nausea or vomiting. 30 tablet Kawon Willcutt, Myrene Galas, MD   benzonatate (TESSALON) 100 MG capsule Take 1 capsule (100 mg total) by mouth every 8 (eight) hours. 21 capsule Ritika Hellickson, Myrene Galas, MD   chlorpheniramine-HYDROcodone [facility] PENNKINETIC ER) 10-8 MG/5ML SUER Take 5 mLs by mouth every 12 (twelve) hours as needed for cough. 115 mL Whitlee Sluder, Myrene Galas, MD     PDMP not reviewed this encounter.   Chase Picket, MD 02/01/20 [phone]

## 2020-02-01 NOTE — ED Triage Notes (Signed)
PT C/O: cold sx onset this morning associated w/cough, fever, weakness, dizziness, nauseas  DENIES: v/d  TAKING MEDS: OTC acetaminophen w/no relief  A&O x4... NAD... Ambulatory

## 2020-02-01 NOTE — Discharge Instructions (Signed)
Increase oral fluid intake Take medications as directed If you have any worsening symptoms please return to the urgent care to be reevaluated.

## 2020-02-02 ENCOUNTER — Telehealth: Payer: Self-pay | Admitting: Infectious Diseases

## 2020-02-02 NOTE — Telephone Encounter (Signed)
Called to Discuss with patient about Covid symptoms and the use of the monoclonal antibody infusion for those with mild to moderate Covid symptoms and at a high risk of hospitalization.     Pt appears to qualify for this infusion due to co-morbid conditions and/or a member of an at-risk group in accordance with the FDA Emergency Use Authorization.    Unable to reach pt - LVM and sent mychart message   Symptom onset: 12/28 Vaccinated: no Qualified for Infusion: BMI, unvaccinated, SVI score 4, HTN and pre-diabetes    Rexene Alberts, MSN, NP-C [facility]  [email or web address] Pager: [phone] Office: [phone] [facility] Main Line: [phone]

## 2020-02-07 ENCOUNTER — Ambulatory Visit: Payer: BC Managed Care – PPO | Admitting: Psychology

## 2020-02-08 ENCOUNTER — Other Ambulatory Visit: Payer: Self-pay

## 2020-02-08 ENCOUNTER — Telehealth (INDEPENDENT_AMBULATORY_CARE_PROVIDER_SITE_OTHER): Payer: BC Managed Care – PPO | Admitting: Registered Nurse

## 2020-02-08 ENCOUNTER — Encounter: Payer: Self-pay | Admitting: Registered Nurse

## 2020-02-08 VITALS — Ht 67.0 in | Wt 240.0 lb

## 2020-02-08 DIAGNOSIS — R062 Wheezing: Secondary | ICD-10-CM | POA: Diagnosis not present

## 2020-02-08 DIAGNOSIS — U071 COVID-19: Secondary | ICD-10-CM | POA: Diagnosis not present

## 2020-02-08 DIAGNOSIS — R059 Cough, unspecified: Secondary | ICD-10-CM

## 2020-02-08 MED ORDER — PREDNISONE 10 MG (21) PO TBPK: ORAL_TABLET | ORAL | 0 refills | Status: DC

## 2020-02-08 MED ORDER — ALBUTEROL SULFATE HFA 108 (90 BASE) MCG/ACT IN AERS: 2.0000 | INHALATION_SPRAY | Freq: Four times a day (QID) | RESPIRATORY_TRACT | 0 refills | Status: DC | PRN

## 2020-02-08 MED ORDER — GUAIFENESIN-DM 100-10 MG/5ML PO SYRP: 5.0000 mL | ORAL_SOLUTION | ORAL | 0 refills | Status: DC | PRN

## 2020-02-08 MED ORDER — HYDROCODONE-HOMATROPINE 5-1.5 MG/5ML PO SYRP: 5.0000 mL | ORAL_SOLUTION | Freq: Every evening | ORAL | 0 refills | Status: DC | PRN

## 2020-02-08 NOTE — Progress Notes (Signed)
Telemedicine Encounter- SOAP NOTE Established Patient  This telephone encounter was conducted with the patient's (or proxy's) verbal consent via audio telecommunications: yes  Patient was instructed to have this encounter in a suitably private space; and to only have persons present to whom they give permission to participate. In addition, patient identity was confirmed by use of name plus two identifiers (DOB and address).  I discussed the limitations, risks, security and privacy concerns of performing an evaluation and management service by telephone and the availability of in person appointments. I also discussed with the patient that there may be a patient responsible charge related to this service. The patient expressed understanding and agreed to proceed.  I spent a total of 15 minutes talking with the patient or their proxy.  Patient at home,  Provider in office  Chief Complaint  Patient presents with  . Covid Positive    COVID Pos 02/01/20  . Cough    SHOB, dizzy, body aches   . Depression    F/u     Subjective   Paula Cervantes is a 45 y.o. established patient. Telephone visit today for covid  HPI Positive on 02/01/20 Taking cough syrup from Urgent care - did not help much unfortunately Tylenol for headaches and body aches, phenergen for nausea. Tylenol every 6 hours - 500mg  Many chills - rapidly hot and cold Coughing - all day, all night. Mucus coming up sparingly. No vomiting or diarrhea - some first two days, but quickly resolved shob- when exerting herself or when coughing, resolves quickly.  Patient Active Problem List   Diagnosis Date Noted  . Pre-diabetes 10/26/2018  . Dysmenorrhea 01/07/2018  . Status post laparoscopic hysterectomy 01/07/2018  . Iron deficiency anemia due to chronic blood loss 10/07/2017  . DUB (dysfunctional uterine bleeding) 10/07/2017  . Fatigue 10/07/2017  . History of colonic polyps 10/07/2017  . Migraine 08/23/2014  . S/P repeat  low transverse C-section 02/09/2014    Past Medical History:  Diagnosis Date  . Abnormal Pap smear    no cervical cancer, just h/o cryotherapy  . Anemia   . Arthritis    knees  . Asthma    hx as a child, no problems as adult, no inhaler  . Endometriosis   . Fibroids   . Gout    knees  . Headache   . Heartburn during pregnancy   . History of hiatal hernia   . Hypothyroidism   . Lactose intolerance     Current Outpatient Medications  Medication Sig Dispense Refill  . allopurinol (ZYLOPRIM) 100 MG tablet Take 1 tablet (100 mg total) by mouth daily. 90 tablet 3  . colchicine 0.6 MG tablet One three times a day as needed with food 30 tablet 3  . FLUoxetine (PROZAC) 10 MG capsule Take 1 capsule (10 mg total) by mouth daily. After 2 weeks, increase to 2 capsules (20mg  total) by mouth daily. Take in the morning. 90 capsule 0  . phentermine 30 MG capsule Take 1 capsule (30 mg total) by mouth every morning. 30 capsule 0  . promethazine (PHENERGAN) 25 MG tablet Take 1 tablet (25 mg total) by mouth every 6 (six) hours as needed for nausea or vomiting. 30 tablet 0  . traZODone (DESYREL) 50 MG tablet Take 0.5-1 tablets (25-50 mg total) by mouth at bedtime as needed for sleep. 30 tablet 3  . chlorpheniramine-HYDROcodone (TUSSIONEX PENNKINETIC ER) 10-8 MG/5ML SUER Take 5 mLs by mouth every 12 (twelve) hours as needed for  cough. (Patient not taking: Reported on 02/08/2020) 115 mL 0   No current facility-administered medications for this visit.    Allergies  Allergen Reactions  . Aspirin Hives and Nausea And Vomiting  . Coconut Flavor Hives  . Other Other (See Comments)    Hi Chew Candy- caused blisters inside mouth.  . Penicillins Nausea And Vomiting and Other (See Comments)    Passed out Has patient had a PCN reaction causing immediate rash, facial/tongue/throat swelling, SOB or lightheadedness with hypotension: unknown Has patient had a PCN reaction causing severe rash involving mucus  membranes or skin necrosis: unknown Has patient had a PCN reaction that required hospitalization: unknown Has patient had a PCN reaction occurring within the last 10 years: no If all of the above answers are "NO", then may proceed with Cephalosporin use.   . Sulfa Antibiotics Other (See Comments)    Pt states that this medication causes blisters in mouth and throat.     Social History   Socioeconomic History  . Marital status: Married    Spouse name: Not on file  . Number of children: 2  . Years of education: Not on file  . Highest education level: Not on file  Occupational History  . Not on file  Tobacco Use  . Smoking status: Former Smoker    Packs/day: 1.00    Types: Cigarettes    Quit date: 03/27/2002    Years since quitting: 17.8  . Smokeless tobacco: Never Used  Vaping Use  . Vaping Use: Never used  Substance and Sexual Activity  . Alcohol use: No  . Drug use: No  . Sexual activity: Yes    Birth control/protection: Other-see comments    Comment: tubal ligation   Other Topics Concern  . Not on file  Social History Narrative  . Not on file   Social Determinants of Health   Financial Resource Strain: Not on file  Food Insecurity: Not on file  Transportation Needs: Not on file  Physical Activity: Not on file  Stress: Not on file  Social Connections: Not on file  Intimate Partner Violence: Not on file    ROS Per hpi   Objective   Vitals as reported by the patient: Today's Vitals   02/08/20 0826  Weight: 240 lb (108.9 kg)  Height: 5\' 7"  (1.702 m)    Paula Cervantes was seen today for covid positive, cough and depression.  Diagnoses and all orders for this visit:  COVID-19 -     guaiFENesin-dextromethorphan (ROBITUSSIN DM) 100-10 MG/5ML syrup; Take 5 mLs by mouth every 4 (four) hours as needed for cough. -     predniSONE (STERAPRED UNI-PAK 21 TAB) 10 MG (21) TBPK tablet; Take per package instructions. Do not skip doses. Finish entire supply. -      HYDROcodone-homatropine (HYCODAN) 5-1.5 MG/5ML syrup; Take 5 mLs by mouth at bedtime as needed for cough. -     albuterol (VENTOLIN HFA) 108 (90 Base) MCG/ACT inhaler; Inhale 2 puffs into the lungs every 6 (six) hours as needed for wheezing or shortness of breath.  Cough -     guaiFENesin-dextromethorphan (ROBITUSSIN DM) 100-10 MG/5ML syrup; Take 5 mLs by mouth every 4 (four) hours as needed for cough. -     predniSONE (STERAPRED UNI-PAK 21 TAB) 10 MG (21) TBPK tablet; Take per package instructions. Do not skip doses. Finish entire supply. -     HYDROcodone-homatropine (HYCODAN) 5-1.5 MG/5ML syrup; Take 5 mLs by mouth at bedtime as needed for cough. -  albuterol (VENTOLIN HFA) 108 (90 Base) MCG/ACT inhaler; Inhale 2 puffs into the lungs every 6 (six) hours as needed for wheezing or shortness of breath.  Wheezing -     guaiFENesin-dextromethorphan (ROBITUSSIN DM) 100-10 MG/5ML syrup; Take 5 mLs by mouth every 4 (four) hours as needed for cough. -     predniSONE (STERAPRED UNI-PAK 21 TAB) 10 MG (21) TBPK tablet; Take per package instructions. Do not skip doses. Finish entire supply. -     HYDROcodone-homatropine (HYCODAN) 5-1.5 MG/5ML syrup; Take 5 mLs by mouth at bedtime as needed for cough. -     albuterol (VENTOLIN HFA) 108 (90 Base) MCG/ACT inhaler; Inhale 2 puffs into the lungs every 6 (six) hours as needed for wheezing or shortness of breath.   PLAN  Symptom relief as above  Increase tylenol to 3000mg  total daily in 3 - 6 divided doses.  Isolation guidelines reviewed  ER precautions reviewed  Questions answered, concerns addressed  Patient encouraged to call clinic with any questions, comments, or concerns.  I discussed the assessment and treatment plan with the patient. The patient was provided an opportunity to ask questions and all were answered. The patient agreed with the plan and demonstrated an understanding of the instructions.   The patient was advised to call back or  seek an in-person evaluation if the symptoms worsen or if the condition fails to improve as anticipated.  I provided 15 minutes of non-face-to-face time during this encounter.  Maximiano Coss, NP  Primary Care at [facility]

## 2020-02-08 NOTE — Patient Instructions (Signed)
° ° ° °  If you have lab work done today you will be contacted with your lab results within the next 2 weeks.  If you have not heard from us then please contact us. The fastest way to get your results is to register for My Chart. ° ° °IF you received an x-ray today, you will receive an invoice from [facility]. Please contact [facility] at [phone] with questions or concerns regarding your invoice.  ° °IF you received labwork today, you will receive an invoice from [facility]. Please contact [facility] at [phone] with questions or concerns regarding your invoice.  ° °Our billing staff will not be able to assist you with questions regarding bills from these companies. ° °You will be contacted with the lab results as soon as they are available. The fastest way to get your results is to activate your My Chart account. Instructions are located on the last page of this paperwork. If you have not heard from us regarding the results in 2 weeks, please contact this office. °  ° ° ° °

## 2020-02-17 ENCOUNTER — Telehealth: Payer: Self-pay | Admitting: Registered Nurse

## 2020-02-17 NOTE — Telephone Encounter (Signed)
Pt called and stated that she is still having issues from her last visit on 02/08/20. I do have her sch for 02/20/20 for a virtual appt regarding her symptoms pt is wanting a refill of the steroid medication. I did let pt know that they might not proscribe it until her appt she showed understanding. Please advise.

## 2020-02-20 ENCOUNTER — Telehealth: Payer: BC Managed Care – PPO | Admitting: Registered Nurse

## 2020-02-20 ENCOUNTER — Other Ambulatory Visit: Payer: Self-pay

## 2020-02-20 NOTE — Telephone Encounter (Signed)
Tried calling pt to resch appt

## 2020-02-20 NOTE — Telephone Encounter (Signed)
Looks like we we unable to get in contact with pt for their virtual appt today pt needs an appt.

## 2020-02-22 ENCOUNTER — Other Ambulatory Visit: Payer: Self-pay

## 2020-02-22 ENCOUNTER — Encounter: Payer: Self-pay | Admitting: Registered Nurse

## 2020-02-22 ENCOUNTER — Telehealth (INDEPENDENT_AMBULATORY_CARE_PROVIDER_SITE_OTHER): Payer: BC Managed Care – PPO | Admitting: Registered Nurse

## 2020-02-22 DIAGNOSIS — J22 Unspecified acute lower respiratory infection: Secondary | ICD-10-CM

## 2020-02-22 MED ORDER — AZITHROMYCIN 250 MG PO TABS: ORAL_TABLET | ORAL | 0 refills | Status: DC

## 2020-02-22 NOTE — Patient Instructions (Signed)
° ° ° °  If you have lab work done today you will be contacted with your lab results within the next 2 weeks.  If you have not heard from us then please contact us. The fastest way to get your results is to register for My Chart. ° ° °IF you received an x-ray today, you will receive an invoice from [facility]. Please contact [facility] at [phone] with questions or concerns regarding your invoice.  ° °IF you received labwork today, you will receive an invoice from [facility]. Please contact [facility] at [phone] with questions or concerns regarding your invoice.  ° °Our billing staff will not be able to assist you with questions regarding bills from these companies. ° °You will be contacted with the lab results as soon as they are available. The fastest way to get your results is to activate your My Chart account. Instructions are located on the last page of this paperwork. If you have not heard from us regarding the results in 2 weeks, please contact this office. °  ° ° ° °

## 2020-03-01 ENCOUNTER — Other Ambulatory Visit: Payer: Self-pay | Admitting: Registered Nurse

## 2020-03-01 ENCOUNTER — Encounter: Payer: Self-pay | Admitting: Registered Nurse

## 2020-03-01 ENCOUNTER — Other Ambulatory Visit: Payer: Self-pay | Admitting: *Deleted

## 2020-03-01 DIAGNOSIS — R062 Wheezing: Secondary | ICD-10-CM

## 2020-03-01 DIAGNOSIS — U071 COVID-19: Secondary | ICD-10-CM

## 2020-03-01 DIAGNOSIS — R059 Cough, unspecified: Secondary | ICD-10-CM

## 2020-03-01 MED ORDER — ALBUTEROL SULFATE HFA 108 (90 BASE) MCG/ACT IN AERS: 2.0000 | INHALATION_SPRAY | Freq: Four times a day (QID) | RESPIRATORY_TRACT | 0 refills | Status: AC | PRN

## 2020-03-07 DIAGNOSIS — L720 Epidermal cyst: Secondary | ICD-10-CM | POA: Diagnosis not present

## 2020-04-09 ENCOUNTER — Encounter: Payer: Self-pay | Admitting: Registered Nurse

## 2020-04-09 NOTE — Progress Notes (Signed)
Established Patient Office Visit  Subjective:  Patient ID: Arieon Scalzo, female    DOB: 04-10-1975  Age: 45 y.o. MRN: [id number]  CC:  Chief Complaint  Patient presents with  . Follow-up    Patient states she is here to discuss her medication refill and also to discuss her weight.     HPI Denine Brotz presents for med management  Anxiety and Depression: Has been improving but still having sleep disturbance and breakthrough anxiety Has not been able to completely resolve her grief Will continue to work to establish relationship with counselor Overall better but interested in medication   Weight: Has been struggling with weight in relation to grief Diet and exercise have been poor but patient at a stage where she is ready for action Has used phentermine in the past with good effect. Would be interested in trying this again   Past Medical History:  Diagnosis Date  . Abnormal Pap smear    no cervical cancer, just h/o cryotherapy  . Anemia   . Arthritis    knees  . Asthma    hx as a child, no problems as adult, no inhaler  . Endometriosis   . Fibroids   . Gout    knees  . Headache   . Heartburn during pregnancy   . History of hiatal hernia   . Hypothyroidism   . Lactose intolerance     Past Surgical History:  Procedure Laterality Date  . CESAREAN SECTION N/A 09/20/2012   Procedure: PRIMARY CESAREAN SECTION;  Surgeon: Margarette Asal, MD;  Location: [facility];  Service: Obstetrics;  Laterality: N/A;  Primary edc 8/25  . CESAREAN SECTION WITH BILATERAL TUBAL LIGATION Bilateral 02/09/2014   Procedure: CESAREAN SECTION WITH BILATERAL TUBAL LIGATION;  Surgeon: Logan Bores, MD;  Location: [facility];  Service: Obstetrics;  Laterality: Bilateral;  . COLONOSCOPY     polyps  . CRYOTHERAPY    . CYSTOSCOPY N/A 01/07/2018   Procedure: CYSTOSCOPY;  Surgeon: Sherlyn Hay, DO;  Location: [facility];  Service: Gynecology;  Laterality: N/A;  . DIAGNOSTIC LAPAROSCOPY    .  KNEE SURGERY Bilateral    x 3 arthroscopic knee  . ovarian fibroids    . TOTAL LAPAROSCOPIC HYSTERECTOMY WITH SALPINGECTOMY Bilateral 01/07/2018   Procedure: TOTAL LAPAROSCOPIC HYSTERECTOMY WITH SALPINGECTOMY;  Surgeon: Sherlyn Hay, DO;  Location: [facility];  Service: Gynecology;  Laterality: Bilateral;  . TUBAL LIGATION  2016  . UPPER GI ENDOSCOPY    . WISDOM TOOTH EXTRACTION      Family History  Problem Relation Age of Onset  . Diabetes Mother   . Hypertension Mother   . Hyperlipidemia Mother   . Heart disease Maternal Grandmother   . Alcohol abuse Neg Hx   . Arthritis Neg Hx   . Asthma Neg Hx   . Birth defects Neg Hx   . Cancer Neg Hx   . COPD Neg Hx   . Depression Neg Hx   . Drug abuse Neg Hx   . Early death Neg Hx   . Hearing loss Neg Hx   . Kidney disease Neg Hx   . Learning disabilities Neg Hx   . Mental illness Neg Hx   . Mental retardation Neg Hx   . Miscarriages / Stillbirths Neg Hx   . Stroke Neg Hx   . Vision loss Neg Hx   . Varicose Veins Neg Hx     Social History   Socioeconomic History  . Marital status:  Married    Spouse name: Not on file  . Number of children: 2  . Years of education: Not on file  . Highest education level: Not on file  Occupational History  . Not on file  Tobacco Use  . Smoking status: Former Smoker    Packs/day: 1.00    Types: Cigarettes    Quit date: 03/27/2002    Years since quitting: 18.0  . Smokeless tobacco: Never Used  Vaping Use  . Vaping Use: Never used  Substance and Sexual Activity  . Alcohol use: No  . Drug use: No  . Sexual activity: Yes    Birth control/protection: Other-see comments    Comment: tubal ligation   Other Topics Concern  . Not on file  Social History Narrative  . Not on file   Social Determinants of Health   Financial Resource Strain: Not on file  Food Insecurity: Not on file  Transportation Needs: Not on file  Physical Activity: Not on file  Stress: Not on file  Social  Connections: Not on file  Intimate Partner Violence: Not on file    Outpatient Medications Prior to Visit  Medication Sig Dispense Refill  . allopurinol (ZYLOPRIM) 100 MG tablet Take 1 tablet (100 mg total) by mouth daily. 90 tablet 3  . colchicine 0.6 MG tablet One three times a day as needed with food 30 tablet 3  . acetic acid-hydrocortisone (VOSOL-HC) OTIC solution Place 3 drops into the right ear 2 (two) times daily. 10 mL 0  . lidocaine (XYLOCAINE) 2 % solution Use as directed 15 mLs in the mouth or throat as needed for mouth pain. (Patient not taking: Reported on 01/04/2020) 100 mL 0   No facility-administered medications prior to visit.    Allergies  Allergen Reactions  . Aspirin Hives and Nausea And Vomiting  . Coconut Flavor Hives  . Other Other (See Comments)    Hi Chew Candy- caused blisters inside mouth.  . Penicillins Nausea And Vomiting and Other (See Comments)    Passed out Has patient had a PCN reaction causing immediate rash, facial/tongue/throat swelling, SOB or lightheadedness with hypotension: unknown Has patient had a PCN reaction causing severe rash involving mucus membranes or skin necrosis: unknown Has patient had a PCN reaction that required hospitalization: unknown Has patient had a PCN reaction occurring within the last 10 years: no If all of the above answers are "NO", then may proceed with Cephalosporin use.   . Sulfa Antibiotics Other (See Comments)    Pt states that this medication causes blisters in mouth and throat.     ROS Review of Systems  Constitutional: Negative.   HENT: Negative.   Eyes: Negative.   Respiratory: Negative.   Cardiovascular: Negative.   Gastrointestinal: Negative.   Genitourinary: Negative.   Musculoskeletal: Negative.   Skin: Negative.   Neurological: Negative.   Psychiatric/Behavioral: Negative.   All other systems reviewed and are negative.     Objective:    Physical Exam Vitals and nursing note reviewed.   Constitutional:      General: She is not in acute distress.    Appearance: Normal appearance. She is normal weight. She is not ill-appearing, toxic-appearing or diaphoretic.  Cardiovascular:     Rate and Rhythm: Normal rate and regular rhythm.     Heart sounds: Normal heart sounds. No murmur heard. No friction rub. No gallop.   Pulmonary:     Effort: Pulmonary effort is normal. No respiratory distress.     Breath  sounds: Normal breath sounds. No stridor. No wheezing, rhonchi or rales.  Chest:     Chest wall: No tenderness.  Skin:    General: Skin is warm and dry.  Neurological:     General: No focal deficit present.     Mental Status: She is alert and oriented to person, place, and time. Mental status is at baseline.  Psychiatric:        Mood and Affect: Mood normal.        Behavior: Behavior normal.        Thought Content: Thought content normal.        Judgment: Judgment normal.     BP 127/85   Pulse 89   Temp 98 F (36.7 C) (Temporal)   Resp 18   Ht 5\' 7"  (1.702 m)   Wt 241 lb 6.4 oz (109.5 kg)   SpO2 100%   BMI 37.81 kg/m  Wt Readings from Last 3 Encounters:  02/08/20 240 lb (108.9 kg)  01/04/20 241 lb 6.4 oz (109.5 kg)  12/06/19 239 lb 9.6 oz (108.7 kg)     Health Maintenance Due  Topic Date Due  . COVID-19 Vaccine (1) Never done  . PAP SMEAR-Modifier  04/21/2020    There are no preventive care reminders to display for this patient.  Lab Results  Component Value Date   TSH 7.930 (H) 12/06/2019   Lab Results  Component Value Date   WBC 5.2 12/06/2019   HGB 13.2 12/06/2019   HCT 40.5 12/06/2019   MCV 93 12/06/2019   PLT 331 10/25/2018   Lab Results  Component Value Date   NA 138 12/06/2019   K 4.3 12/06/2019   CO2 24 12/06/2019   GLUCOSE 92 12/06/2019   BUN 9 12/06/2019   CREATININE 0.88 12/06/2019   BILITOT 0.2 12/06/2019   ALKPHOS 61 12/06/2019   AST 11 12/06/2019   ALT 13 12/06/2019   PROT 6.7 12/06/2019   ALBUMIN 4.1 12/06/2019    CALCIUM 9.1 12/06/2019   ANIONGAP 8 05/05/2014   Lab Results  Component Value Date   CHOL 197 12/06/2019   Lab Results  Component Value Date   HDL 39 (L) 12/06/2019   Lab Results  Component Value Date   LDLCALC 126 (H) 12/06/2019   Lab Results  Component Value Date   TRIG 180 (H) 12/06/2019   Lab Results  Component Value Date   CHOLHDL 5.1 (H) 12/06/2019   Lab Results  Component Value Date   HGBA1C 6.1 (H) 12/06/2019      Assessment & Plan:   Problem List Items Addressed This Visit   None   Visit Diagnoses    Sleep disturbance    -  Primary   Relevant Medications   traZODone (DESYREL) 50 MG tablet   Complicated grief       Relevant Medications   FLUoxetine (PROZAC) 10 MG capsule   Depression with anxiety       Relevant Medications   FLUoxetine (PROZAC) 10 MG capsule   traZODone (DESYREL) 50 MG tablet   Insomnia secondary to depression with anxiety       Relevant Medications   FLUoxetine (PROZAC) 10 MG capsule   traZODone (DESYREL) 50 MG tablet   BMI 38.0-38.9,adult       Relevant Medications   phentermine 30 MG capsule      Meds ordered this encounter  Medications  . FLUoxetine (PROZAC) 10 MG capsule    Sig: Take 1 capsule (10 mg total) by  mouth daily. After 2 weeks, increase to 2 capsules (20mg  total) by mouth daily. Take in the morning.    Dispense:  90 capsule    Refill:  0    Order Specific Question:   Supervising Provider    Answer:   Carlota Raspberry, JEFFREY R [[id number]]  . traZODone (DESYREL) 50 MG tablet    Sig: Take 0.5-1 tablets (25-50 mg total) by mouth at bedtime as needed for sleep.    Dispense:  30 tablet    Refill:  3    Order Specific Question:   Supervising Provider    Answer:   Carlota Raspberry, JEFFREY R [[id number]]  . phentermine 30 MG capsule    Sig: Take 1 capsule (30 mg total) by mouth every morning.    Dispense:  30 capsule    Refill:  0    Order Specific Question:   Supervising Provider    Answer:   Carlota Raspberry, JEFFREY R [[id number]]    Follow-up: No  follow-ups on file.   PLAN  Start fluoxetine daily. Trazodone 25-50mg  PO qhs PRN  Phentermine - 1 mo trial, discussed with pt that this is not for long term use. Pt agrees.  Return in 4-6 weeks for med check  Continue to work with counselor  Patient encouraged to call clinic with any questions, comments, or concerns.  Maximiano Coss, NP

## 2020-05-29 DIAGNOSIS — M25552 Pain in left hip: Secondary | ICD-10-CM | POA: Diagnosis not present

## 2020-05-29 DIAGNOSIS — M1612 Unilateral primary osteoarthritis, left hip: Secondary | ICD-10-CM | POA: Diagnosis not present

## 2020-05-29 DIAGNOSIS — Z96641 Presence of right artificial hip joint: Secondary | ICD-10-CM | POA: Diagnosis not present

## 2020-05-29 DIAGNOSIS — Z7689 Persons encountering health services in other specified circumstances: Secondary | ICD-10-CM | POA: Diagnosis not present

## 2020-06-01 DIAGNOSIS — M9903 Segmental and somatic dysfunction of lumbar region: Secondary | ICD-10-CM | POA: Diagnosis not present

## 2020-06-01 DIAGNOSIS — M9902 Segmental and somatic dysfunction of thoracic region: Secondary | ICD-10-CM | POA: Diagnosis not present

## 2020-06-01 DIAGNOSIS — M5417 Radiculopathy, lumbosacral region: Secondary | ICD-10-CM | POA: Diagnosis not present

## 2020-06-01 DIAGNOSIS — M9905 Segmental and somatic dysfunction of pelvic region: Secondary | ICD-10-CM | POA: Diagnosis not present

## 2020-06-04 DIAGNOSIS — M9905 Segmental and somatic dysfunction of pelvic region: Secondary | ICD-10-CM | POA: Diagnosis not present

## 2020-06-04 DIAGNOSIS — M9902 Segmental and somatic dysfunction of thoracic region: Secondary | ICD-10-CM | POA: Diagnosis not present

## 2020-06-04 DIAGNOSIS — M9903 Segmental and somatic dysfunction of lumbar region: Secondary | ICD-10-CM | POA: Diagnosis not present

## 2020-06-04 DIAGNOSIS — M5417 Radiculopathy, lumbosacral region: Secondary | ICD-10-CM | POA: Diagnosis not present

## 2020-06-18 DIAGNOSIS — M9905 Segmental and somatic dysfunction of pelvic region: Secondary | ICD-10-CM | POA: Diagnosis not present

## 2020-06-18 DIAGNOSIS — M9902 Segmental and somatic dysfunction of thoracic region: Secondary | ICD-10-CM | POA: Diagnosis not present

## 2020-06-18 DIAGNOSIS — M5417 Radiculopathy, lumbosacral region: Secondary | ICD-10-CM | POA: Diagnosis not present

## 2020-06-18 DIAGNOSIS — M9903 Segmental and somatic dysfunction of lumbar region: Secondary | ICD-10-CM | POA: Diagnosis not present

## 2020-07-31 DIAGNOSIS — Z6837 Body mass index (BMI) 37.0-37.9, adult: Secondary | ICD-10-CM | POA: Diagnosis not present

## 2020-07-31 DIAGNOSIS — F331 Major depressive disorder, recurrent, moderate: Secondary | ICD-10-CM | POA: Diagnosis not present

## 2020-07-31 DIAGNOSIS — F4321 Adjustment disorder with depressed mood: Secondary | ICD-10-CM | POA: Diagnosis not present

## 2020-08-03 ENCOUNTER — Other Ambulatory Visit: Payer: Self-pay

## 2020-08-03 ENCOUNTER — Ambulatory Visit (INDEPENDENT_AMBULATORY_CARE_PROVIDER_SITE_OTHER): Payer: BC Managed Care – PPO

## 2020-08-03 ENCOUNTER — Ambulatory Visit: Payer: BC Managed Care – PPO | Admitting: Podiatry

## 2020-08-03 ENCOUNTER — Other Ambulatory Visit: Payer: Self-pay | Admitting: Podiatry

## 2020-08-03 DIAGNOSIS — M7752 Other enthesopathy of left foot: Secondary | ICD-10-CM

## 2020-08-03 DIAGNOSIS — M79672 Pain in left foot: Secondary | ICD-10-CM

## 2020-08-03 DIAGNOSIS — Q828 Other specified congenital malformations of skin: Secondary | ICD-10-CM | POA: Diagnosis not present

## 2020-08-03 DIAGNOSIS — M898X7 Other specified disorders of bone, ankle and foot: Secondary | ICD-10-CM | POA: Diagnosis not present

## 2020-08-03 DIAGNOSIS — L909 Atrophic disorder of skin, unspecified: Secondary | ICD-10-CM | POA: Diagnosis not present

## 2020-08-03 MED ORDER — BETAMETHASONE SOD PHOS & ACET 6 (3-3) MG/ML IJ SUSP: 6.0000 mg | Freq: Once | INTRAMUSCULAR | Status: AC

## 2020-08-03 NOTE — Progress Notes (Signed)
  Subjective:  Patient ID: Paula Cervantes, female    DOB: 03/27/1975,  MRN: [id number]  Chief Complaint  Patient presents with   Foot Pain    Left foot medial aspect pain 1 1/2 month duration   Foot Problem    Sub left 5th painful lesion   45 y.o. female presents with the above complaint. History confirmed with patient.   Objective:  Physical Exam: warm, good capillary refill, no trophic changes or ulcerative lesions, normal DP and PT pulses, and normal sensory exam. Left Foot: POP left 1st MPJ with crepitus on ROM. POP left submet 5 with deep punctate keratosis.     Assessment:   1. Capsulitis of metatarsophalangeal (MTP) joint of left foot   2. Porokeratosis   3. Pain in metatarsus of left foot   4. Fat pad atrophy of foot    Plan:  Patient was evaluated and treated and all questions answered.  Capsulitis -Educated on etiology -Injection delivered to the painful joint  Procedure: Joint Injection Location: Left 1st MPJ joint Skin Prep: Alcohol. Injectate: 0.5 cc 1% lidocaine plain, 0.5 cc betamethasone acetate-betamethasone sodium phosphate Disposition: Patient tolerated procedure well. Injection site dressed with a band-aid.  5th Met deep porokeratosis, HPK submet hallux IPJ -Debrided to patient tolerance. -Discussed with patient that should issues persist consider 5th met head excision. Could also consider sesamoidectomy hallux for HPK at this area.  Return in about 6 weeks (around 09/14/2020) for Capsulitis.

## 2020-08-14 ENCOUNTER — Ambulatory Visit (INDEPENDENT_AMBULATORY_CARE_PROVIDER_SITE_OTHER): Payer: BC Managed Care – PPO | Admitting: Psychology

## 2020-08-14 DIAGNOSIS — F4321 Adjustment disorder with depressed mood: Secondary | ICD-10-CM | POA: Diagnosis not present

## 2020-08-14 DIAGNOSIS — Z6836 Body mass index (BMI) 36.0-36.9, adult: Secondary | ICD-10-CM | POA: Diagnosis not present

## 2020-08-14 DIAGNOSIS — F331 Major depressive disorder, recurrent, moderate: Secondary | ICD-10-CM | POA: Diagnosis not present

## 2020-08-21 ENCOUNTER — Ambulatory Visit (INDEPENDENT_AMBULATORY_CARE_PROVIDER_SITE_OTHER): Payer: BC Managed Care – PPO | Admitting: Psychology

## 2020-08-21 DIAGNOSIS — F4321 Adjustment disorder with depressed mood: Secondary | ICD-10-CM

## 2020-08-29 DIAGNOSIS — R7303 Prediabetes: Secondary | ICD-10-CM | POA: Diagnosis not present

## 2020-08-29 DIAGNOSIS — F331 Major depressive disorder, recurrent, moderate: Secondary | ICD-10-CM | POA: Diagnosis not present

## 2020-08-29 DIAGNOSIS — Z6837 Body mass index (BMI) 37.0-37.9, adult: Secondary | ICD-10-CM | POA: Diagnosis not present

## 2020-08-29 DIAGNOSIS — R7989 Other specified abnormal findings of blood chemistry: Secondary | ICD-10-CM | POA: Diagnosis not present

## 2020-09-04 ENCOUNTER — Ambulatory Visit (INDEPENDENT_AMBULATORY_CARE_PROVIDER_SITE_OTHER): Payer: BC Managed Care – PPO | Admitting: Psychology

## 2020-09-04 DIAGNOSIS — F4321 Adjustment disorder with depressed mood: Secondary | ICD-10-CM

## 2020-09-14 ENCOUNTER — Ambulatory Visit (INDEPENDENT_AMBULATORY_CARE_PROVIDER_SITE_OTHER): Payer: BC Managed Care – PPO | Admitting: Podiatry

## 2020-09-14 DIAGNOSIS — Z5329 Procedure and treatment not carried out because of patient's decision for other reasons: Secondary | ICD-10-CM

## 2020-09-24 ENCOUNTER — Ambulatory Visit (INDEPENDENT_AMBULATORY_CARE_PROVIDER_SITE_OTHER): Payer: BC Managed Care – PPO | Admitting: Psychology

## 2020-09-24 DIAGNOSIS — F4321 Adjustment disorder with depressed mood: Secondary | ICD-10-CM | POA: Diagnosis not present

## 2020-10-09 ENCOUNTER — Ambulatory Visit: Payer: BC Managed Care – PPO | Admitting: Psychology

## 2021-03-23 NOTE — Progress Notes (Signed)
Telemedicine Encounter- SOAP NOTE Established Patient  This telephone encounter was conducted with the patient's (or proxy's) verbal consent via audio telecommunications: yes/no: Yes Patient was instructed to have this encounter in a suitably private space; and to only have persons present to whom they give permission to participate. In addition, patient identity was confirmed by use of name plus two identifiers (DOB and address).  I discussed the limitations, risks, security and privacy concerns of performing an evaluation and management service by telephone and the availability of in person appointments. I also discussed with the patient that there may be a patient responsible charge related to this service. The patient expressed understanding and agreed to proceed.  I spent a total of 14 minutes talking with the patient or their proxy.  Patient at home Provider in office  Participants: Kathrin Ruddy, NP and Otho Bellows  Chief Complaint  Patient presents with   Medication Refill    Patient states she is still having some trouble breathing from covid-19, and also a really bad cough. She would like to know if she could get a refill on the prednisone     Subjective   Paula Cervantes is a 46 y.o. established patient. Telephone visit today for follow up  HPI COVID-19, recent course not resolved Had done prednisone for this, she had taken with some relief. Unfortunately now having shob, productive cough. Interested in options  Denies chest pain, headache, visual changes, loc, dizziness, lightheadedness, nvd.  Patient Active Problem List   Diagnosis Date Noted   Pre-diabetes 10/26/2018   Dysmenorrhea 01/07/2018   Status post laparoscopic hysterectomy 01/07/2018   Iron deficiency anemia due to chronic blood loss 10/07/2017   DUB (dysfunctional uterine bleeding) 10/07/2017   Fatigue 10/07/2017   History of colonic polyps 10/07/2017   Migraine 08/23/2014   S/P repeat low  transverse C-section 02/09/2014    Past Medical History:  Diagnosis Date   Abnormal Pap smear    no cervical cancer, just h/o cryotherapy   Anemia    Arthritis    knees   Asthma    hx as a child, no problems as adult, no inhaler   Endometriosis    Fibroids    Gout    knees   Headache    Heartburn during pregnancy    History of hiatal hernia    Hypothyroidism    Lactose intolerance     Current Outpatient Medications  Medication Sig Dispense Refill   allopurinol (ZYLOPRIM) 100 MG tablet Take 1 tablet (100 mg total) by mouth daily. 90 tablet 3   azithromycin (ZITHROMAX) 250 MG tablet Take 2 tabs on first day. Then take 1 tab daily. Finish entire supply. 6 tablet 0   chlorpheniramine-HYDROcodone (TUSSIONEX PENNKINETIC ER) 10-8 MG/5ML SUER Take 5 mLs by mouth every 12 (twelve) hours as needed for cough. 115 mL 0   colchicine 0.6 MG tablet One three times a day as needed with food 30 tablet 3   FLUoxetine (PROZAC) 10 MG capsule Take 1 capsule (10 mg total) by mouth daily. After 2 weeks, increase to 2 capsules (20mg  total) by mouth daily. Take in the morning. 90 capsule 0   guaiFENesin-dextromethorphan (ROBITUSSIN DM) 100-10 MG/5ML syrup Take 5 mLs by mouth every 4 (four) hours as needed for cough. 118 mL 0   HYDROcodone-homatropine (HYCODAN) 5-1.5 MG/5ML syrup Take 5 mLs by mouth at bedtime as needed for cough. 120 mL 0   phentermine 30 MG capsule Take 1 capsule (30 mg total)  by mouth every morning. 30 capsule 0   promethazine (PHENERGAN) 25 MG tablet Take 1 tablet (25 mg total) by mouth every 6 (six) hours as needed for nausea or vomiting. 30 tablet 0   traZODone (DESYREL) 50 MG tablet Take 0.5-1 tablets (25-50 mg total) by mouth at bedtime as needed for sleep. 30 tablet 3   albuterol (VENTOLIN HFA) 108 (90 Base) MCG/ACT inhaler Inhale 2 puffs into the lungs every 6 (six) hours as needed for wheezing or shortness of breath. 8 g 0   predniSONE (STERAPRED UNI-PAK 21 TAB) 10 MG (21) TBPK  tablet Take per package instructions. Do not skip doses. Finish entire supply. 1 each 0   sertraline (ZOLOFT) 50 MG tablet Take 50 mg by mouth daily.     Current Facility-Administered Medications  Medication Dose Route Frequency Provider Last Rate Last Admin   betamethasone acetate-betamethasone sodium phosphate (CELESTONE) injection 6 mg  6 mg Other Once Evelina Bucy, DPM        Allergies  Allergen Reactions   Aspirin Hives and Nausea And Vomiting   Coconut Flavor Hives   Other Other (See Comments)    Hi Chew Candy- caused blisters inside mouth.   Penicillins Nausea And Vomiting and Other (See Comments)    Passed out Has patient had a PCN reaction causing immediate rash, facial/tongue/throat swelling, SOB or lightheadedness with hypotension: unknown Has patient had a PCN reaction causing severe rash involving mucus membranes or skin necrosis: unknown Has patient had a PCN reaction that required hospitalization: unknown Has patient had a PCN reaction occurring within the last 10 years: no If all of the above answers are "NO", then may proceed with Cephalosporin use.    Sulfa Antibiotics Other (See Comments)    Pt states that this medication causes blisters in mouth and throat.     Social History   Socioeconomic History   Marital status: Married    Spouse name: Not on file   Number of children: 2   Years of education: Not on file   Highest education level: Not on file  Occupational History   Not on file  Tobacco Use   Smoking status: Former    Packs/day: 1.00    Types: Cigarettes    Quit date: 03/27/2002    Years since quitting: 19.0   Smokeless tobacco: Never  Vaping Use   Vaping Use: Never used  Substance and Sexual Activity   Alcohol use: No   Drug use: No   Sexual activity: Yes    Birth control/protection: Other-see comments    Comment: tubal ligation   Other Topics Concern   Not on file  Social History Narrative   Not on file   Social Determinants of  Health   Financial Resource Strain: Not on file  Food Insecurity: Not on file  Transportation Needs: Not on file  Physical Activity: Not on file  Stress: Not on file  Social Connections: Not on file  Intimate Partner Violence: Not on file    ROS Per hpi   Objective   Vitals as reported by the patient: There were no vitals filed for this visit.  Cayleen was seen today for medication refill.  Diagnoses and all orders for this visit:  Lower respiratory infection -     azithromycin (ZITHROMAX) 250 MG tablet; Take 2 tabs on first day. Then take 1 tab daily. Finish entire supply.   PLAN Concern for developing CAP. Will give zpack and reocmmend deep breathing, otc cold and  flu relief. Hydrate and rest Return if worsening or failing to improve. Would need in person visit Patient encouraged to call clinic with any questions, comments, or concerns.   I discussed the assessment and treatment plan with the patient. The patient was provided an opportunity to ask questions and all were answered. The patient agreed with the plan and demonstrated an understanding of the instructions.   The patient was advised to call back or seek an in-person evaluation if the symptoms worsen or if the condition fails to improve as anticipated.  I provided 14 minutes of non-face-to-face time during this encounter.  Maximiano Coss, NP  Primary Care at [facility]

## 2021-03-28 DIAGNOSIS — M9903 Segmental and somatic dysfunction of lumbar region: Secondary | ICD-10-CM | POA: Diagnosis not present

## 2021-03-28 DIAGNOSIS — M9902 Segmental and somatic dysfunction of thoracic region: Secondary | ICD-10-CM | POA: Diagnosis not present

## 2021-03-28 DIAGNOSIS — M9905 Segmental and somatic dysfunction of pelvic region: Secondary | ICD-10-CM | POA: Diagnosis not present

## 2021-03-28 DIAGNOSIS — M5417 Radiculopathy, lumbosacral region: Secondary | ICD-10-CM | POA: Diagnosis not present

## 2021-04-01 ENCOUNTER — Emergency Department (HOSPITAL_COMMUNITY): Payer: BC Managed Care – PPO

## 2021-04-01 ENCOUNTER — Emergency Department (HOSPITAL_COMMUNITY)
Admission: EM | Admit: 2021-04-01 | Discharge: 2021-04-01 | Discharge status: Against Medical Advice | Payer: BC Managed Care – PPO | Attending: Emergency Medicine | Admitting: Emergency Medicine

## 2021-04-01 ENCOUNTER — Other Ambulatory Visit: Payer: Self-pay

## 2021-04-01 DIAGNOSIS — R072 Precordial pain: Secondary | ICD-10-CM | POA: Diagnosis not present

## 2021-04-01 DIAGNOSIS — Z5321 Procedure and treatment not carried out due to patient leaving prior to being seen by health care provider: Secondary | ICD-10-CM | POA: Insufficient documentation

## 2021-04-01 DIAGNOSIS — R0789 Other chest pain: Secondary | ICD-10-CM | POA: Diagnosis not present

## 2021-04-01 DIAGNOSIS — R200 Anesthesia of skin: Secondary | ICD-10-CM | POA: Diagnosis not present

## 2021-04-01 DIAGNOSIS — R079 Chest pain, unspecified: Secondary | ICD-10-CM | POA: Diagnosis not present

## 2021-04-01 LAB — BASIC METABOLIC PANEL
Anion gap: 9 (ref 5–15)
BUN: 7 mg/dL (ref 6–20)
CO2: 23 mmol/L (ref 22–32)
Calcium: 8.9 mg/dL (ref 8.9–10.3)
Chloride: 106 mmol/L (ref 98–111)
Creatinine, Ser: 0.94 mg/dL (ref 0.44–1.00)
GFR, Estimated: >60 mL/min (ref >60)
Glucose, Bld: 131 mg/dL — ABNORMAL HIGH (ref 70–99)
Potassium: 3.5 mmol/L (ref 3.5–5.1)
Sodium: 138 mmol/L (ref 135–145)

## 2021-04-01 LAB — CBC
HCT: 41.3 % (ref 36.0–46.0)
Hemoglobin: 13.7 g/dL (ref 12.0–15.0)
MCH: 31.1 pg (ref 26.0–34.0)
MCHC: 33.2 g/dL (ref 30.0–36.0)
MCV: 93.7 fL (ref 80.0–100.0)
Platelets: 309 10*3/uL (ref 150–400)
RBC: 4.41 MIL/uL (ref 3.87–5.11)
RDW: 12.6 % (ref 11.5–15.5)
WBC: 6.9 10*3/uL (ref 4.0–10.5)
nRBC: 0 % (ref 0.0–0.2)

## 2021-04-01 LAB — TROPONIN I (HIGH SENSITIVITY): Troponin I (High Sensitivity): 4 ng/L (ref <18)

## 2021-04-01 LAB — I-STAT BETA HCG BLOOD, ED (MC, WL, AP ONLY): I-stat hCG, quantitative: <5 m[IU]/mL (ref <5)

## 2021-04-01 NOTE — ED Provider Triage Note (Signed)
Emergency Medicine Provider Triage Evaluation Note  Paula Cervantes , a 46 y.o. female  was evaluated in triage.  Pt complains of substernal chest pain that started this evening.  Patient was not doing any exertional activity.  Also been complaining of left arm numbness this been ongoing for couple of weeks.  No shortness of breath, nausea, vomiting, diaphoresis.  Review of Systems  Positive:  Negative: See above   Physical Exam  BP (!) 149/71    Pulse 92    Temp 98.7 F (37.1 C) (Oral)    Resp 18    SpO2 97%  Gen:   Awake, no distress   Resp:  Normal effort  MSK:   Moves extremities without difficulty  Other:    Medical Decision Making  Medically screening exam initiated at 7:44 PM.  Appropriate orders placed.  Paula Cervantes was informed that the remainder of the evaluation will be completed by another provider, this initial triage assessment does not replace that evaluation, and the importance of remaining in the ED until their evaluation is complete.     Paula Cervantes, [place] 04/01/21 1945

## 2021-04-01 NOTE — ED Triage Notes (Signed)
Pt from home for eval of chest pain constant since this afternoon with associated lightheadedness. Pain worse when deep breathing. Has also had intermittent L arm tingling x a few days.

## 2021-04-01 NOTE — ED Notes (Signed)
Pt left. Pt stated she was feeling better and wanted to go home.

## 2021-04-09 DIAGNOSIS — Z131 Encounter for screening for diabetes mellitus: Secondary | ICD-10-CM | POA: Diagnosis not present

## 2021-04-09 DIAGNOSIS — Z1231 Encounter for screening mammogram for malignant neoplasm of breast: Secondary | ICD-10-CM | POA: Diagnosis not present

## 2021-04-09 DIAGNOSIS — Z0000 Encounter for general adult medical examination without abnormal findings: Secondary | ICD-10-CM | POA: Diagnosis not present

## 2021-04-09 DIAGNOSIS — R079 Chest pain, unspecified: Secondary | ICD-10-CM | POA: Diagnosis not present

## 2021-04-09 DIAGNOSIS — Z6834 Body mass index (BMI) 34.0-34.9, adult: Secondary | ICD-10-CM | POA: Diagnosis not present

## 2021-04-09 DIAGNOSIS — Z13220 Encounter for screening for lipoid disorders: Secondary | ICD-10-CM | POA: Diagnosis not present

## 2021-04-11 ENCOUNTER — Other Ambulatory Visit: Payer: Self-pay | Admitting: Family Medicine

## 2021-04-11 DIAGNOSIS — Z1231 Encounter for screening mammogram for malignant neoplasm of breast: Secondary | ICD-10-CM

## 2021-04-24 ENCOUNTER — Ambulatory Visit: Payer: BC Managed Care – PPO

## 2021-04-24 ENCOUNTER — Ambulatory Visit
Admission: RE | Admit: 2021-04-24 | Discharge: 2021-04-24 | Disposition: A | Discharge status: Home / Self Care | Payer: BC Managed Care – PPO | Source: Ambulatory Visit | Attending: Family Medicine | Admitting: Family Medicine

## 2021-04-24 DIAGNOSIS — Z1231 Encounter for screening mammogram for malignant neoplasm of breast: Secondary | ICD-10-CM

## 2021-05-13 ENCOUNTER — Ambulatory Visit: Payer: BC Managed Care – PPO | Admitting: Podiatry

## 2021-05-13 ENCOUNTER — Encounter: Payer: Self-pay | Admitting: Podiatry

## 2021-05-13 DIAGNOSIS — E669 Obesity, unspecified: Secondary | ICD-10-CM | POA: Insufficient documentation

## 2021-05-13 DIAGNOSIS — B070 Plantar wart: Secondary | ICD-10-CM

## 2021-05-13 DIAGNOSIS — R799 Abnormal finding of blood chemistry, unspecified: Secondary | ICD-10-CM | POA: Insufficient documentation

## 2021-05-13 DIAGNOSIS — Z1211 Encounter for screening for malignant neoplasm of colon: Secondary | ICD-10-CM | POA: Insufficient documentation

## 2021-05-13 NOTE — Patient Instructions (Signed)
Look for urea 40% cream or ointment and apply to the thickened dry skin / calluses. This can be bought over the counter, at a pharmacy or online such as [employer]. ? ? ? ?For now, salicylic acid 68-03% will help to lift layers of the skin, apply every 2-5 days ?

## 2021-05-14 NOTE — Progress Notes (Signed)
?  Subjective:  ?Patient ID: Paula Cervantes, female    DOB: 03/31/1975,  MRN: [id number] ? ?Chief Complaint  ?Patient presents with  ? Foot Pain  ?  left foot burning sensation on the bottom of foot  ? Callouses  ?  Painful porokeratoses left foot  ? ? ?46 y.o. female presents with the above complaint. History confirmed with patient.  She follows up today for ongoing issues with lesions on the left foot that are quite painful.  She has had treatment for these before they continue to recur ? ?Objective:  ?Physical Exam: ?warm, good capillary refill, no trophic changes or ulcerative lesions, normal DP and PT pulses, normal sensory exam, and sub-IPJ of the hallux and left Submetatarsal 5 there are 2 painful hyperkeratotic skin lesions, disruption of skin lines running this pain with direct and lateral compression.. ? ?Assessment:  ? ?1. Verruca plantaris   ? ? ? ?Plan:  ?Patient was evaluated and treated and all questions answered. ? ?To me these lesions appear to be consistent with a deep verruca plantaris, less likely a porokeratosis.  I debrided the lesions today with a sharp chisel blade and applied salicylic acid ointment to destroy them.  I recommend she continue salicylic acid ointment at home.  We also discussed the option of a more aggressive blistering agent such as Cantharone.  Alternatively could consider a punch biopsy to excise them and get a diagnosis.  I will see her back in 5 to 6 weeks if not improved. ? ?Return if symptoms worsen or fail to improve.  ? ?

## 2021-06-07 DIAGNOSIS — M1612 Unilateral primary osteoarthritis, left hip: Secondary | ICD-10-CM | POA: Diagnosis not present

## 2021-08-19 DIAGNOSIS — Z01812 Encounter for preprocedural laboratory examination: Secondary | ICD-10-CM | POA: Diagnosis not present

## 2021-08-19 DIAGNOSIS — M1612 Unilateral primary osteoarthritis, left hip: Secondary | ICD-10-CM | POA: Diagnosis not present

## 2021-08-19 DIAGNOSIS — M25552 Pain in left hip: Secondary | ICD-10-CM | POA: Diagnosis not present

## 2021-08-23 DIAGNOSIS — Z1231 Encounter for screening mammogram for malignant neoplasm of breast: Secondary | ICD-10-CM | POA: Diagnosis not present

## 2021-08-23 DIAGNOSIS — Z6833 Body mass index (BMI) 33.0-33.9, adult: Secondary | ICD-10-CM | POA: Diagnosis not present

## 2021-08-23 DIAGNOSIS — Z01818 Encounter for other preprocedural examination: Secondary | ICD-10-CM | POA: Diagnosis not present

## 2021-08-23 DIAGNOSIS — B3731 Acute candidiasis of vulva and vagina: Secondary | ICD-10-CM | POA: Diagnosis not present

## 2021-08-23 DIAGNOSIS — M25552 Pain in left hip: Secondary | ICD-10-CM | POA: Diagnosis not present

## 2021-09-02 ENCOUNTER — Ambulatory Visit: Payer: BC Managed Care – PPO | Admitting: Podiatry

## 2021-09-02 DIAGNOSIS — B070 Plantar wart: Secondary | ICD-10-CM | POA: Diagnosis not present

## 2021-09-02 DIAGNOSIS — Z96642 Presence of left artificial hip joint: Secondary | ICD-10-CM | POA: Diagnosis not present

## 2021-09-02 NOTE — Progress Notes (Signed)
  Subjective:  Patient ID: Paula Cervantes, female    DOB: [birth year],  MRN: [id number]  Chief Complaint  Patient presents with   Foot Problem   Plantar Warts    Plantar wart to left ball of foot. Painful to walk on it.     46 y.o. female presents with the above complaint. History confirmed with patient.  She follows up today for ongoing issues with lesions on the left foot that are quite painful.  The last treatment was quite helpful  Objective:  Physical Exam: warm, good capillary refill, no trophic changes or ulcerative lesions, normal DP and PT pulses, normal sensory exam, and sub-IPJ of the hallux and left Submetatarsal 5 there are 2 painful hyperkeratotic skin lesions, disruption of skin lines running this pain with direct and lateral compression..  Assessment:   1. Verruca plantaris      Plan:  Patient was evaluated and treated and all questions answered.  We again discussed the etiology of a deep verruca plantaris, less likely a porokeratosis.  I debrided the lesions today with a sharp chisel blade and applied salicylic acid ointment to destroy them.  I recommend she continue salicylic acid ointment at home.  She may benefit from punch biopsy or Cantharone.  She has upcoming hip surgery this week and so we do not apply Cantharone today.  I will see her back in 6 weeks  Return in about 6 weeks (around 10/14/2021) for wart treatment.

## 2021-09-05 DIAGNOSIS — M1612 Unilateral primary osteoarthritis, left hip: Secondary | ICD-10-CM | POA: Diagnosis not present

## 2021-10-14 ENCOUNTER — Ambulatory Visit: Payer: BC Managed Care – PPO | Admitting: Podiatry

## 2021-11-12 DIAGNOSIS — Z6832 Body mass index (BMI) 32.0-32.9, adult: Secondary | ICD-10-CM | POA: Diagnosis not present

## 2021-11-12 DIAGNOSIS — R042 Hemoptysis: Secondary | ICD-10-CM | POA: Diagnosis not present

## 2021-11-12 DIAGNOSIS — Z96642 Presence of left artificial hip joint: Secondary | ICD-10-CM | POA: Diagnosis not present

## 2021-11-12 DIAGNOSIS — J302 Other seasonal allergic rhinitis: Secondary | ICD-10-CM | POA: Diagnosis not present

## 2022-01-10 DIAGNOSIS — J45909 Unspecified asthma, uncomplicated: Secondary | ICD-10-CM | POA: Diagnosis not present

## 2022-01-10 DIAGNOSIS — G4489 Other headache syndrome: Secondary | ICD-10-CM | POA: Diagnosis not present

## 2022-01-10 DIAGNOSIS — Z20822 Contact with and (suspected) exposure to covid-19: Secondary | ICD-10-CM | POA: Diagnosis not present

## 2022-01-10 DIAGNOSIS — R509 Fever, unspecified: Secondary | ICD-10-CM | POA: Diagnosis not present

## 2022-01-10 DIAGNOSIS — J069 Acute upper respiratory infection, unspecified: Secondary | ICD-10-CM | POA: Diagnosis not present

## 2022-01-10 DIAGNOSIS — R079 Chest pain, unspecified: Secondary | ICD-10-CM | POA: Diagnosis not present

## 2022-01-10 DIAGNOSIS — R0789 Other chest pain: Secondary | ICD-10-CM | POA: Diagnosis not present

## 2022-02-20 ENCOUNTER — Ambulatory Visit: Payer: BC Managed Care – PPO | Admitting: Podiatry

## 2022-02-20 DIAGNOSIS — M7741 Metatarsalgia, right foot: Secondary | ICD-10-CM

## 2022-02-20 DIAGNOSIS — B070 Plantar wart: Secondary | ICD-10-CM | POA: Diagnosis not present

## 2022-02-20 DIAGNOSIS — M7742 Metatarsalgia, left foot: Secondary | ICD-10-CM

## 2022-02-20 DIAGNOSIS — M216X2 Other acquired deformities of left foot: Secondary | ICD-10-CM

## 2022-02-20 NOTE — Patient Instructions (Signed)
Look for [employer]os shoes or [place] Slides, wear these at home   Take dressing off in 8 hours and wash the foot with soap and water. If it is hurting or becomes uncomfortable before the 8 hours, go ahead and remove the bandage and wash the area.  If it blisters, apply antibiotic ointment and a band-aid.   Monitor for any signs/symptoms of infection. Call the office immediately if any occur or go directly to the emergency room. Call with any questions/concerns.

## 2022-02-24 NOTE — Progress Notes (Signed)
  Subjective:  Patient ID: Paula Cervantes, female    DOB: December 05, 1975,  MRN: [id number]  Chief Complaint  Patient presents with   Plantar Warts    left foot spot is back again    47 y.o. female presents with the above complaint. History confirmed with patient.  She follows up today for ongoing issues with lesions on the left foot that are quite painful.  The last treatment was quite helpful  Objective:  Physical Exam: warm, good capillary refill, no trophic changes or ulcerative lesions, normal DP and PT pulses, normal sensory exam, and sub-IPJ of the hallux and left Submetatarsal 5 there are 2 painful hyperkeratotic skin lesions, disruption of skin lines running this pain with direct and lateral compression..  Assessment:   1. Verruca plantaris   2. Metatarsalgia of both feet   3. Plantar flexed metatarsal, left      Plan:  Patient was evaluated and treated and all questions answered.  Discussed etiology and treatment of verruca plantaris in detail with the patient as well as multiple treatment options including blistering agents, chemotherapeutic agents, surgical excision, laser therapy and the indications and roles of the above.  Today, recommended treatment with Cantharone as noted in procedure note below.  Follow-up in 6 weeks for reevaluation  Procedure: Destruction of Lesion Location: Left foot submetatarsal 5 Instrumentation: 15 blade. Technique: Debridement of lesion to petechial bleeding. Aperture pad applied around lesion. Small amount of canthrone applied to the base of the lesion. Dressing: Dry, sterile, compression dressing. Disposition: Patient tolerated procedure well. Advised to leave dressing on for 6-8 hours. Thereafter patient to wash the area with soap and water and applied band-aid. Off-loading pads dispensed. Patient to return in 3 weeks for follow-up.  I do think she would benefit from a custom orthosis to offload the lesions as well as the other hyperkeratotic  spots.  Patient was casted for these today  Return in about 6 weeks (around 04/03/2022) for f/u wart treatment and orthotics pick up .

## 2022-02-25 ENCOUNTER — Ambulatory Visit: Payer: BC Managed Care – PPO | Admitting: Podiatry

## 2022-04-03 ENCOUNTER — Encounter: Payer: Self-pay | Admitting: Podiatry

## 2022-04-03 ENCOUNTER — Ambulatory Visit: Payer: BC Managed Care – PPO | Admitting: Podiatry

## 2022-04-03 DIAGNOSIS — M7742 Metatarsalgia, left foot: Secondary | ICD-10-CM

## 2022-04-03 DIAGNOSIS — M7741 Metatarsalgia, right foot: Secondary | ICD-10-CM | POA: Diagnosis not present

## 2022-04-03 DIAGNOSIS — M216X2 Other acquired deformities of left foot: Secondary | ICD-10-CM | POA: Diagnosis not present

## 2022-04-03 DIAGNOSIS — B070 Plantar wart: Secondary | ICD-10-CM

## 2022-04-03 NOTE — Progress Notes (Signed)
  Subjective:  Patient ID: Paula Cervantes, female    DOB: [birth year],  MRN: [id number]  Chief Complaint  Patient presents with   Plantar Warts    Follow up left foot   Foot Orthotics    Pick up orthotics    47 y.o. female presents with the above complaint. History confirmed with patient.  Its improved quite a bit, it has been quite sore  Objective:  Physical Exam: warm, good capillary refill, no trophic changes or ulcerative lesions, normal DP and PT pulses, normal sensory exam, and significant blistering occurred on the fifth metatarsal lesion removal of this reveals a central core of the lesion removed, IPJ hallux still present  Assessment:   1. Verruca plantaris   2. Metatarsalgia of both feet   3. Plantar flexed metatarsal, left      Plan:  Patient was evaluated and treated and all questions answered.  Doing much better and the lesion is nearly fully destroyed.  I recommend she continue to treat with [id number] salicylic acid using nightly until it fully resolves.  Orthotics were ready and dispensed today, they were assessed for form fit and function and they seem to fit her well hopefully these alleviate this long-term. No follow-ups on file.

## 2022-04-03 NOTE — Patient Instructions (Signed)
Look for salicylic acid [id number] cream or ointment and apply to the thickened dry skin / calluses. This can be bought over the counter, at a pharmacy or online such as [employer].

## 2022-04-30 ENCOUNTER — Encounter: Payer: Self-pay | Admitting: Podiatrist

## 2022-04-30 ENCOUNTER — Ambulatory Visit (INDEPENDENT_AMBULATORY_CARE_PROVIDER_SITE_OTHER): Payer: BC Managed Care – PPO | Admitting: Podiatrist

## 2022-04-30 DIAGNOSIS — R062 Wheezing: Secondary | ICD-10-CM

## 2022-04-30 DIAGNOSIS — R059 Cough, unspecified: Secondary | ICD-10-CM

## 2022-04-30 DIAGNOSIS — M109 Gout, unspecified: Secondary | ICD-10-CM | POA: Diagnosis not present

## 2022-04-30 DIAGNOSIS — M79671 Pain in right foot: Secondary | ICD-10-CM | POA: Diagnosis not present

## 2022-04-30 DIAGNOSIS — U071 COVID-19: Secondary | ICD-10-CM

## 2022-04-30 MED ORDER — PREDNISONE 10 MG (21) PO TBPK: ORAL_TABLET | ORAL | 0 refills | Status: DC

## 2022-04-30 NOTE — Patient Instructions (Signed)

## 2022-04-30 NOTE — Progress Notes (Signed)
Chief Complaint  Patient presents with   Foot Pain    Patient states that her right foot started hurting yesterday. She states that she is having a constant pain. She thinks that she may have gout.     HPI: Patient is 47 y.o. female who presents today for pain in her right foot.  She relates she has had gout attacks in her knees before which were alleviated with steroids and an injection of steroid.  She relates she has never had a gout attack in her foot but this feels very simiar. She is already taking allopurinol and colchicine for flares.   Current Outpatient Medications  Medication Instructions   albuterol (VENTOLIN HFA) 108 (90 Base) MCG/ACT inhaler 2 puffs, Inhalation, Every 6 hours PRN   allopurinol (ZYLOPRIM) 100 mg, Oral, Daily   azithromycin (ZITHROMAX) 250 MG tablet Take 2 tabs on first day. Then take 1 tab daily. Finish entire supply.   chlorpheniramine-HYDROcodone (TUSSIONEX PENNKINETIC ER) 10-8 MG/5ML SUER 5 mLs, Oral, Every 12 hours PRN   colchicine 0.6 MG tablet One three times a day as needed with food   FLUoxetine (PROZAC) 10 mg, Oral, Daily, After 2 weeks, increase to 2 capsules (20mg  total) by mouth daily. Take in the morning.   guaiFENesin-dextromethorphan (ROBITUSSIN DM) 100-10 MG/5ML syrup 5 mLs, Oral, Every 4 hours PRN   HYDROcodone-homatropine (HYCODAN) 5-1.5 MG/5ML syrup 5 mLs, Oral, At bedtime PRN   nystatin cream (MYCOSTATIN) Topical, 2 times daily   phentermine 30 mg, Oral, BH-each morning   predniSONE (STERAPRED UNI-PAK 21 TAB) 10 MG (21) TBPK tablet Take per package instructions. Do not skip doses. Finish entire supply.   promethazine (PHENERGAN) 25 mg, Oral, Every 6 hours PRN   sertraline (ZOLOFT) 50 mg, Oral, Daily   traZODone (DESYREL) 25-50 mg, Oral, At bedtime PRN     Allergies  Allergen Reactions   Aspirin Hives and Nausea And Vomiting   Coconut Flavor Hives   Other Other (See Comments)    Hi Chew Candy- caused blisters inside mouth.    Penicillins Nausea And Vomiting and Other (See Comments)    Passed out Has patient had a PCN reaction causing immediate rash, facial/tongue/throat swelling, SOB or lightheadedness with hypotension: unknown Has patient had a PCN reaction causing severe rash involving mucus membranes or skin necrosis: unknown Has patient had a PCN reaction that required hospitalization: unknown Has patient had a PCN reaction occurring within the last 10 years: no If all of the above answers are "NO", then may proceed with Cephalosporin use.    Sulfa Antibiotics Other (See Comments)    Pt states that this medication causes blisters in mouth and throat.     Review of systems is negative except as noted in the HPI.  Denies nausea/ vomiting/ fevers/ chills or night sweats.   Denies difficulty breathing, denies calf pain or tenderness  Physical Exam  Patient is awake, alert, and oriented x 3.  In no acute distress.    Vascular status is intact with palpable pedal pulses DP and PT bilateral and capillary refill time less than 3 seconds bilateral.  No edema or erythema noted.   Neurological exam reveals epicritic and protective sensation grossly intact bilateral.   Dermatological exam reveals skin is supple and dry to bilateral feet.  Redness and swelling at the area of the first metatarsal phalangeal joint is present.  Warmth at this joint is also noted. No open lesions noted. No interdigital maceration present.   Musculoskeletal exam: Musculature intact  with dorsiflexion, plantarflexion, inversion, eversion. First MPJ joint range of motion decreased and painful right.   Radiographic exam:  Soft tissue swelling first mpj noted.  Otherwise Normal osseous mineralization.  No fracture or dislocation or acute osseous abnormalities present.  Joint spaces are normal.    Assessment:   ICD-10-CM   1. Gout of foot, unspecified cause, unspecified chronicity, unspecified laterality  M10.9 DG Foot Complete Right    2.  Foot pain, right  M79.671 predniSONE (STERAPRED UNI-PAK 21 TAB) 10 MG (21) TBPK tablet        Plan: Exam findings and treatment recommendations are discussed.  Recommended an injection of steroid as well as an oral steroid taper. She agreed and an injection consisting of kenalog 10 and marcaine plain was administered without complication. She will continue to elevate. Due to the relapsing nature of her attacks, I recommended she consider a consult with rheumatology.  Will make her a referral. She will call if symptoms fail to resolve in 2-3 days.

## 2022-05-14 DIAGNOSIS — M25512 Pain in left shoulder: Secondary | ICD-10-CM | POA: Diagnosis not present

## 2022-05-14 DIAGNOSIS — M5412 Radiculopathy, cervical region: Secondary | ICD-10-CM | POA: Diagnosis not present

## 2022-05-16 DIAGNOSIS — M5412 Radiculopathy, cervical region: Secondary | ICD-10-CM | POA: Diagnosis not present

## 2022-05-16 DIAGNOSIS — M7532 Calcific tendinitis of left shoulder: Secondary | ICD-10-CM | POA: Diagnosis not present

## 2022-05-20 DIAGNOSIS — M542 Cervicalgia: Secondary | ICD-10-CM | POA: Diagnosis not present

## 2022-05-21 DIAGNOSIS — M25512 Pain in left shoulder: Secondary | ICD-10-CM | POA: Diagnosis not present

## 2022-05-26 DIAGNOSIS — M542 Cervicalgia: Secondary | ICD-10-CM | POA: Diagnosis not present

## 2022-05-29 NOTE — Progress Notes (Signed)
Office Visit Note  Patient: Paula Cervantes             Date of Birth: Feb 21, 1975           MRN: [id number]             PCP: Lezlie Lye, Meda Coffee, MD Referring: Delories Heinz, DPM Visit Date: 06/12/2022 Occupation: @GUAROCC @  Subjective:  Pain in multiple joints   History of Present Illness: Paula Cervantes is a 47 y.o. female who presents today as a new patient consultation as requested by her podiatrist.  According to the patient she was originally diagnosed with gout at the age of 20.  She was initially started on allopurinol 100 mg daily and was prescribed colchicine to take as needed during gout flares.  She states that over the past 10 years she has been having more frequent gout flares.  She states that she has had GI upset taking allopurinol in the past so most weeks she only takes about 4 doses of allopurinol.  She states that she also has GI upset when she takes colchicine during gout flares.  She states that her gout flares have typically been in her feet but she has also been recently diagnosed with pseudogout in both knees.  She states that she has had several cortisone injections in her knees in the past and states that her gout flares are typically very responsive to the use of prednisone.  She completed a Medrol Dosepak yesterday for a recent flare.  She states she is also having some increased discomfort in her C-spine and both shoulder joints.  She had an MRI of the C-spine on 05/20/2022 and an MRI arthrogram on 05/21/2022 ordered by Dr. Dion Saucier for further evaluation. Patient states that she has family history of psoriatic arthritis in both her mother and maternal aunt.   Activities of Daily Living:  Patient reports morning stiffness for all day. Patient Reports nocturnal pain.  Difficulty dressing/grooming: Reports Difficulty climbing stairs: Reports Difficulty getting out of chair: Reports Difficulty using hands for taps, buttons, cutlery, and/or writing: Denies  Review  of Systems  Constitutional:  Positive for fatigue.  HENT:  Negative for mouth sores and mouth dryness.   Eyes:  Negative for dryness.  Respiratory:  Negative for shortness of breath.   Cardiovascular:  Negative for chest pain and palpitations.  Gastrointestinal:  Negative for blood in stool, constipation and diarrhea.  Endocrine: Negative for increased urination.  Genitourinary:  Negative for involuntary urination.  Musculoskeletal:  Positive for joint pain, gait problem, joint pain, joint swelling, myalgias, muscle weakness, morning stiffness, muscle tenderness and myalgias.  Skin:  Positive for rash. Negative for color change, hair loss and sensitivity to sunlight.  Allergic/Immunologic: Positive for susceptible to infections.  Neurological:  Positive for numbness, headaches and parasthesias. Negative for dizziness.  Hematological:  Negative for swollen glands.  Psychiatric/Behavioral:  Positive for sleep disturbance. Negative for depressed mood. The patient is not nervous/anxious.     PMFS History:  Patient Active Problem List   Diagnosis Date Noted   Abnormal finding of blood chemistry, unspecified 05/13/2021   Colon cancer screening 05/13/2021   Obesity 05/13/2021   Pre-diabetes 10/26/2018   Dysmenorrhea 01/07/2018   Status post laparoscopic hysterectomy 01/07/2018   Iron deficiency anemia due to chronic blood loss 10/07/2017   DUB (dysfunctional uterine bleeding) 10/07/2017   Fatigue 10/07/2017   History of colonic polyps 10/07/2017   Migraine 08/23/2014   S/P repeat low transverse C-section 02/09/2014  Past Medical History:  Diagnosis Date   Abnormal Pap smear    no cervical cancer, just h/o cryotherapy   Anemia    Arthritis    knees   Asthma    hx as a child, no problems as adult, no inhaler   Endometriosis    Fibroids    Gout    knees   Headache    Heartburn during pregnancy    History of hiatal hernia    Hypothyroidism    Lactose intolerance     Family  History  Problem Relation Age of Onset   Other Mother        joint replacements   Diabetes Mother    Hypertension Mother    Hyperlipidemia Mother    Other Maternal Aunt        joint replacements   Heart disease Maternal Grandmother    Other Maternal Grandfather        joint replacements   Healthy Son    Healthy Daughter    Alcohol abuse Neg Hx    Arthritis Neg Hx    Asthma Neg Hx    Birth defects Neg Hx    Cancer Neg Hx    COPD Neg Hx    Depression Neg Hx    Drug abuse Neg Hx    Early death Neg Hx    Hearing loss Neg Hx    Kidney disease Neg Hx    Learning disabilities Neg Hx    Mental illness Neg Hx    Mental retardation Neg Hx    Miscarriages / Stillbirths Neg Hx    Stroke Neg Hx    Vision loss Neg Hx    Varicose Veins Neg Hx    Past Surgical History:  Procedure Laterality Date   CESAREAN SECTION N/A 09/20/2012   Procedure: PRIMARY CESAREAN SECTION;  Surgeon: Meriel Pica, MD;  Location: [facility];  Service: Obstetrics;  Laterality: N/A;  Primary edc 8/25   CESAREAN SECTION WITH BILATERAL TUBAL LIGATION Bilateral 02/09/2014   Procedure: CESAREAN SECTION WITH BILATERAL TUBAL LIGATION;  Surgeon: Oliver Pila, MD;  Location: [facility];  Service: Obstetrics;  Laterality: Bilateral;   COLONOSCOPY     polyps   CRYOTHERAPY     CYSTOSCOPY N/A 01/07/2018   Procedure: CYSTOSCOPY;  Surgeon: Edwinna Areola, DO;  Location: [facility];  Service: Gynecology;  Laterality: N/A;   DIAGNOSTIC LAPAROSCOPY     KNEE SURGERY Bilateral    x 3 arthroscopic knee   ovarian fibroids     TOTAL HIP ARTHROPLASTY Bilateral    09/06/2021, 12/07/2017   TOTAL LAPAROSCOPIC HYSTERECTOMY WITH SALPINGECTOMY Bilateral 01/07/2018   Procedure: TOTAL LAPAROSCOPIC HYSTERECTOMY WITH SALPINGECTOMY;  Surgeon: Edwinna Areola, DO;  Location: [facility];  Service: Gynecology;  Laterality: Bilateral;   TUBAL LIGATION  2016   UPPER GI ENDOSCOPY     WISDOM TOOTH EXTRACTION     Social History   Social  History Narrative   Not on file   Immunization History  Administered Date(s) Administered   Influenza Inj Mdck Quad Pf 12/22/2017   Influenza,inj,Quad PF,6+ Mos 10/25/2018   Tdap 07/02/2012     Objective: Vital Signs: BP 112/70 (BP Location: Right Arm, Patient Position: Sitting, Cuff Size: Large)   Pulse 86   Resp 15   Ht 5' 6.5" (1.689 m)   Wt 199 lb (90.3 kg)   LMP 01/07/2017   BMI 31.64 kg/m    Physical Exam Vitals and nursing note reviewed.  Constitutional:  Appearance: She is well-developed.  HENT:     Head: Normocephalic and atraumatic.  Eyes:     Conjunctiva/sclera: Conjunctivae normal.  Cardiovascular:     Rate and Rhythm: Normal rate and regular rhythm.     Heart sounds: Normal heart sounds.  Pulmonary:     Effort: Pulmonary effort is normal.     Breath sounds: Normal breath sounds.  Abdominal:     General: Bowel sounds are normal.     Palpations: Abdomen is soft.  Musculoskeletal:     Cervical back: Normal range of motion.  Lymphadenopathy:     Cervical: No cervical adenopathy.  Skin:    General: Skin is warm and dry.     Capillary Refill: Capillary refill takes less than 2 seconds.  Neurological:     Mental Status: She is alert and oriented to person, place, and time.  Psychiatric:        Behavior: Behavior normal.      Musculoskeletal Exam: C-spine has painful range of motion with lateral rotation.  No midline spinal tenderness.  No SI joint tenderness.  Painful range of motion of both shoulder joints.  Elbow joints have good range of motion with no tenderness along the elbow joint line.  Wrist joints, MCPs, PIPs, DIPs have good range of motion with no synovitis.  Complete fist formation bilaterally.  Hip replacements have good range of motion with no discomfort.  Knee joints have good range of motion with no warmth or effusion on examination today.  Ankle joints have good range of motion with no tenderness or joint swelling.  No tenderness or  synovitis over MTP joints.  No evidence of Achilles tendinitis or plantar fasciitis.  No tenderness over the trochanteric bursa bilaterally.  CDAI Exam: CDAI Score: -- Patient Global: --; Provider Global: -- Swollen: --; Tender: -- Joint Exam 06/12/2022   No joint exam has been documented for this visit   There is currently no information documented on the homunculus. Go to the Rheumatology activity and complete the homunculus joint exam.  Investigation: No additional findings.  Imaging: No results found.  Recent Labs: Lab Results  Component Value Date   WBC 6.9 04/01/2021   HGB 13.7 04/01/2021   PLT 309 04/01/2021   NA 138 04/01/2021   K 3.5 04/01/2021   CL 106 04/01/2021   CO2 23 04/01/2021   GLUCOSE 131 (H) 04/01/2021   BUN 7 04/01/2021   CREATININE 0.94 04/01/2021   BILITOT 0.2 12/06/2019   ALKPHOS 61 12/06/2019   AST 11 12/06/2019   ALT 13 12/06/2019   PROT 6.7 12/06/2019   ALBUMIN 4.1 12/06/2019   CALCIUM 8.9 04/01/2021   GFRAA 93 12/06/2019    Speciality Comments: No specialty comments available.  Procedures:  No procedures performed Allergies: Aspirin, Coconut flavor, Other, Penicillins, and Sulfa antibiotics       Assessment / Plan:     Visit Diagnoses: Polyarthralgia - Patient presents today with a longstanding history of pain in multiple joints.  According to the patient she was diagnosed with gout at the age of 34.  She was initially started on allopurinol 100 mg daily but has had issues taking allopurinol on a daily basis.  She has difficulty keeping up with all of her oral medications due to having an aversion to taking pills at times.  Out of the average week she takes allopurinol about 4 times.  She has a prescription for colchicine which she takes as needed during gout flares.  Her joint  pain has been responsive to cortisone injections as well as oral steroid tapers in the past.  She finished a Medrol Dosepak yesterday.  She is been having increased  discomfort in the C-spine, both shoulders, both knees, and both feet.  Of note the patient was recently diagnosed with pseudogout in both knees by Dr. Dion Saucier.  Her gout flares have typically been involving both feet. Reviewed office visit notes from [facility] and plan to obtain records from Dr. Shelba Flake office. The following x-rays and lab work was obtained today for further evaluation.   For the management of gout and pseudogout the plan is for the patient to start taking colchicine 0.6 mg 1 tablet by mouth daily.  Uric acid level will be checked today along with CBC and CMP.  Plan to resume allopurinol 100 mg daily x 1 week then increase to 200 mg daily x 1 week, then increase to 300 mg daily if tolerated. Patient will follow-up in the office in 2 weeks for new patient follow-up visit at which time we will review all records and updated lab/x-ray results.  - Plan: Rheumatoid factor, Cyclic citrul peptide antibody, IgG, Uric acid, Sedimentation rate, C-reactive protein  Pain in both hands - No synovitis noted today. X-rays of both hands obtained today along with the following lab work for further evaluation.  Plan: XR Hand 2 View Left, XR Hand 2 View Right  Pain in left foot -Under the care of Dr. Lilian Kapur and Dr. Irving Shows. X-rays of the left foot were obtained today as a baseline.  Plan: XR Foot 2 Views Left  Pain in right foot: Under the care of Dr. Lilian Kapur and Dr. Irving Shows.  Patient had a recent gout flare requiring both a cortisone injection as well as a oral steroid taper.  X-rays of the right foot were updated on 04/30/2022: Soft tissue swelling first mpj noted.  Otherwise Normal osseous mineralization.  No fracture or dislocation or acute osseous abnormalities present.  Joint spaces are normal.   Medication monitoring encounter -CBC and CMP were updated today.  New prescriptions for both allopurinol and colchicine will be sent to the pharmacy pending lab results.  Plan to update CBC,  CMP, and uric acid at her upcoming follow-up visit.  Plan: COMPLETE METABOLIC PANEL WITH GFR, CBC with Differential/Platelet  Pseudogout: Patient was previously diagnosed with pseudogout and bilateral knees by Dr. Dion Saucier.  Plan to obtain records to review.  X-rays of both knees were updated today.  Patient was encouraged to start taking colchicine 0.6 mg 1 tablet by mouth daily to help minimize the risk for flares.  A new prescription for colchicine will be sent to the pharmacy pending lab results from today.  Neck pain: Under the care of Dr. Dion Saucier.  MRI of the C-spine on 05/20/2022: Multilevel degenerative changes of the cervical spine with prominent disc bulging from C3-C7 resulting in varying degrees of stenosis. She has limited range of motion of the C-spine on examination today.   Chronic left shoulder pain: Under the care of Dr. Dion Saucier. MRI arthrogram of the left shoulder on 05/21/2022 revealed moderate distal supraspinatus tendinosis with low-grade partial with interstitial tear at the footprint measuring 3 mm in width.  Mild distal infraspinatus tendinosis.  Superior labral tear at the biceps labral anchor with possible extension posteriorly.  Mild AC joint arthropathy. She has been experiencing increased pain in both shoulder joints.  On examination today she has good range of motion bilaterally.  She finished a Medrol  Dosepak yesterday.  Chronic pain of both knees - History of meniscal tears in both knees requiring arthroscopic surgery in her teens.  She continues to have chronic pain in both knee joints.  According to the patient she has been diagnosed with pseudogout involving both knees.  She has had several cortisone injections in both knees in the past.  Updated x-rays of both knees were obtained today for further evaluation. Plan to obtain records from Dr. Shelba Flake office to review.  Patient plans on restarting colchicine 0.6 mg 1 tablet by mouth daily pending lab results.  Plan: XR KNEE 3  VIEW RIGHT, XR KNEE 3 VIEW LEFT  Idiopathic chronic gout of multiple sites without tophus -Patient was diagnosed with gout at the age of 18.  She is been experiencing more frequent gout flares over the past 10 years.  She was initially started on allopurinol 100 mg daily and was given a prescription for colchicine 1 tablet 3 times daily as needed during gout flares.  She has had GI intolerance taking both allopurinol and colchicine in the past.  On a weekly basis she takes allopurinol about 4 times. According to the patient she has a mental aversion to taking pills at times which has made it difficult for her to take allopurinol as prescribed.  No recent uric acid level to review.   She has been having frequent flares requiring steroid tapers to alleviate her symptoms.  She finished a Medrol Dosepak yesterday.  She has no active inflammation on examination today. Different treatment options were discussed today in detail.  Discussed resuming allopurinol (titrating dose as tolerated) versus switching to Uloric.  She would like to try restarting allopurinol.  The plan is for her to take allopurinol 100 mg daily x 1 week, 200 mg daily x 1 week, and then increase to 300 mg daily if tolerated.  Reviewed dosing instructions with Chesley Mires, RPH and clarified that both colchicine and allopurinol can be crushed if she does have difficulty swallowing the tablets in the future.  Advised patient to also restart taking colchicine 0.6 mg 1 tablet by mouth daily.  She will notify us if she has GI intolerance at which time we can make dosing or medication adjustments.  She will follow-up in the office in 2 weeks or sooner if needed.  Discussed the importance of avoiding a purine rich diet.  S/P bilateral hip replacements - Performed by Dr. Dion Saucier.  Doing well.  Good range of motion bilaterally.  No groin pain currently.  Other medical conditions are listed as follows:   Hx of migraines  History of colonic  polyps  Iron deficiency anemia due to chronic blood loss  Pre-diabetes  S/P repeat low transverse C-section  Status post laparoscopic hysterectomy    Orders: Orders Placed This Encounter  Procedures   XR KNEE 3 VIEW RIGHT   XR KNEE 3 VIEW LEFT   XR Hand 2 View Left   XR Hand 2 View Right   XR Foot 2 Views Left   COMPLETE METABOLIC PANEL WITH GFR   CBC with Differential/Platelet   Rheumatoid factor   Cyclic citrul peptide antibody, IgG   Uric acid   Sedimentation rate   C-reactive protein   No orders of the defined types were placed in this encounter.   Follow-Up Instructions: Return in about 2 weeks (around 06/26/2022) for NPFU.   Gearldine Bienenstock, PA-C  Note - This record has been created using Dragon software.  Chart creation errors have  been sought, but may not always  have been located. Such creation errors do not reflect on  the standard of medical care.

## 2022-06-04 DIAGNOSIS — T560X1A Toxic effect of lead and its compounds, accidental (unintentional), initial encounter: Secondary | ICD-10-CM | POA: Diagnosis not present

## 2022-06-12 ENCOUNTER — Ambulatory Visit: Payer: BC Managed Care – PPO

## 2022-06-12 ENCOUNTER — Ambulatory Visit (INDEPENDENT_AMBULATORY_CARE_PROVIDER_SITE_OTHER): Payer: BC Managed Care – PPO

## 2022-06-12 ENCOUNTER — Encounter: Payer: Self-pay | Admitting: Physician Assistant

## 2022-06-12 ENCOUNTER — Ambulatory Visit: Payer: BC Managed Care – PPO | Attending: Physician Assistant | Admitting: Physician Assistant

## 2022-06-12 ENCOUNTER — Telehealth: Payer: Self-pay

## 2022-06-12 VITALS — BP 112/70 | HR 86 | Resp 15 | Ht 66.5 in | Wt 199.0 lb

## 2022-06-12 DIAGNOSIS — M79642 Pain in left hand: Secondary | ICD-10-CM

## 2022-06-12 DIAGNOSIS — G8929 Other chronic pain: Secondary | ICD-10-CM | POA: Diagnosis not present

## 2022-06-12 DIAGNOSIS — M79641 Pain in right hand: Secondary | ICD-10-CM

## 2022-06-12 DIAGNOSIS — M2550 Pain in unspecified joint: Secondary | ICD-10-CM | POA: Diagnosis not present

## 2022-06-12 DIAGNOSIS — M79671 Pain in right foot: Secondary | ICD-10-CM

## 2022-06-12 DIAGNOSIS — M542 Cervicalgia: Secondary | ICD-10-CM

## 2022-06-12 DIAGNOSIS — R7303 Prediabetes: Secondary | ICD-10-CM

## 2022-06-12 DIAGNOSIS — Z5181 Encounter for therapeutic drug level monitoring: Secondary | ICD-10-CM | POA: Diagnosis not present

## 2022-06-12 DIAGNOSIS — D500 Iron deficiency anemia secondary to blood loss (chronic): Secondary | ICD-10-CM

## 2022-06-12 DIAGNOSIS — M1A09X0 Idiopathic chronic gout, multiple sites, without tophus (tophi): Secondary | ICD-10-CM | POA: Diagnosis not present

## 2022-06-12 DIAGNOSIS — Z96643 Presence of artificial hip joint, bilateral: Secondary | ICD-10-CM

## 2022-06-12 DIAGNOSIS — M25562 Pain in left knee: Secondary | ICD-10-CM

## 2022-06-12 DIAGNOSIS — M79672 Pain in left foot: Secondary | ICD-10-CM

## 2022-06-12 DIAGNOSIS — Z98891 History of uterine scar from previous surgery: Secondary | ICD-10-CM

## 2022-06-12 DIAGNOSIS — M25561 Pain in right knee: Secondary | ICD-10-CM

## 2022-06-12 DIAGNOSIS — M1120 Other chondrocalcinosis, unspecified site: Secondary | ICD-10-CM | POA: Diagnosis not present

## 2022-06-12 DIAGNOSIS — Z90710 Acquired absence of both cervix and uterus: Secondary | ICD-10-CM

## 2022-06-12 DIAGNOSIS — Z8669 Personal history of other diseases of the nervous system and sense organs: Secondary | ICD-10-CM

## 2022-06-12 DIAGNOSIS — Z86010 Personal history of colonic polyps: Secondary | ICD-10-CM

## 2022-06-12 DIAGNOSIS — M25512 Pain in left shoulder: Secondary | ICD-10-CM | POA: Diagnosis not present

## 2022-06-12 NOTE — Telephone Encounter (Signed)
Pending lab results, please send in rx for allopurinol and colchicine per Sherron Ales, PA-C. Thanks!

## 2022-06-12 NOTE — Patient Instructions (Addendum)
Take allopurinol 100 mg daily x 1 week then increase to 200 mg daily x 1 week, then increase to 300 mg daily. Take colchicine 0.6 mg 1 tablet by mouth daily. **New prescriptions for both allopurinol and colchicine will be sent to the pharmacy tomorrow pending lab results from today.  Okay to crush allopurinol or colchicine if you have difficulty swallowing tablets.    Information for patients with Gout  Gout defined-Gout occurs when urate crystals accumulate in your joint causing the inflammation and intense pain of gout attack.  Urate crystals can form when you have high levels of uric acid in your blood.  Your body produces uric acid when it breaks down prurines-substances that are found naturally in your body, as well as in certain foods such as organ meats, anchioves, herring, asparagus, and mushrooms.  Normally uric acid dissolves in your blood and passes through your kidneys into your urine.  But sometimes your body either produces too much uric acid or your kidneys excrete too little uric acid.  When this happens, uric acid can build up, forming sharp needle-like urate crystals in a joint or surrounding tissue that cause pain, inflammation and swelling.    Gout is characterized by sudden, severe attacks of pain, redness and tenderness in joints, often the joint at the base of the big toe.  Gout is complex form of arthritis that can affect anyone.  Men are more likely to get gout but women become increasingly more susceptible to gout after menopause.  An acute attack of gout can wake you up in the middle of the night with the sensation that your big toe is on fire.  The affected joint is hot, swollen and so tender that even the weight or the sheet on it may seem intolerable.  If you experience symptoms of an acute gout attack it is important to your doctor as soon as the symptoms start.  Gout that goes untreated can lead to worsening pain and joint damage.  Risk Factors:  You are more  likely to develop gout if you have high levels of uric acid in your body.    Factors that increase the uric acid level in your body include:  Lifestyle factors.  Excessive alcohol use-generally more than two drinks a day for men and more than one for women increase the risk of gout.  Medical conditions.  Certain conditions make it more likely that you will develop gout.  These include hypertension, and chronic conditions such as diabetes, high levels of fat and cholesterol in the blood, and narrowing of the arteries.  Certain medications.  The uses of Thiazide diuretics- commonly used to treat hypertension and low dose aspirin can also increase uric acid levels.  Family history of gout.  If other members of your family have had gout, you are more likely to develop the disease.  Age and sex. Gout occurs more often in men than it does in women, primarily because women tend to have lower uric acid levels than men do.  Men are more likely to develop gout earlier usually between the ages of 29-50- whereas women generally develop signs and symptoms after menopause.    Tests and diagnosis:  Tests to help diagnose gout may include:  Blood test.  Your doctor may recommend a blood test to measure the uric acid level in your blood .  Blood tests can be misleading, though.  Some people have high uric acid levels but never experience gout.  And some people  have signs and symptoms of gout, but don't have unusual levels of uric acid in their blood.  Joint fluid test.  Your doctor may use a needle to draw fluid from your affected joint.  When examined under the microscope, your joint fluid may reveal urate crystals.  Treatment:  Treatment for gout usually involves medications.  What medications you and your doctor choose will be based on your current health and other medications you currently take.  Gout medications can be used to treat acute gout attacks and prevent future attacks as well as reduce your  risk of complications from gout such as the development of tophi from urate crystal deposits.  Alternative medicine:   Certain foods have been studied for their potential to lower uric acid levels, including:  Coffee.  Studies have found an association between coffee drinking (regular and decaf) and lower uric acid levels.  The evidence is not enough to encourage non-coffee drinkers to start, but it may give clues to new ways of treating gout in the future.  Vitamin C.  Supplements containing vitamin C may reduce the levels of uric acid in your blood.  However, vitamin as a treatment for gout. Don't assume that if a little vitamin C is good, than lots is better.  Megadoses of vitamin C may increase your bodies uric acid levels.  Cherries.  Cherries have been associated with lower levels of uric acid in studies, but it isn't clear if they have any effect on gout signs and symptoms.  Eating more cherries and other dar-colored fruits, such as blackberries, blueberries, purple grapes and raspberries, may be a safe way to support your gout treatment.    Lifestyle/Diet Recommendations:  Drink 8 to 16 cups ( about 2 to 4 liters) of fluid each day, with at least half being water. Avoid alcohol Eat a moderate amount of protein, preferably from healthy sources, such as low-fat or fat-free dairy, tofu, eggs, and nut butters. Limit you daily intake of meat, fish, and poultry to 4 to 6 ounces. Avoid high fat meats and desserts. Decrease you intake of shellfish, beef, lamb, pork, eggs and cheese. Choose a good source of vitamin C daily such as citrus fruits, strawberries, broccoli,  brussel sprouts, papaya, and cantaloupe.  Choose a good source of vitamin A every other day such as yellow fruits, or dark green/yellow vegetables. Avoid drastic weigh reduction or fasting.  If weigh loss is desired lose it over a period of several months. See "dietary considerations.." chart for specific food  recommendations.  Dietary Considerations for people with Gout  Food with negligible purine content (0-15 mg of purine nitrogen per 100 grams food)  May use as desired except on calorie variations  Non fat milk Cocoa Cereals (except in list II) Hard candies  Buttermilk Carbonated drinks Vegetables (except in list II) Sherbet  Coffee Fruits Sugar Honey  Tea Cottage Cheese Gelatin-jell-o Salt  Fruit juice Breads Angel food Cake   Herbs/spices Jams/Jellies [employer]    Foods that do not contain excessive purine content, but must be limited due to fat content  Cream Eggs Oil and Salad Dressing  Half and Half Peanut Butter Chocolate  Whole Milk Cakes Potato Chips  Butter Ice Cream Fried Foods  Cheese Nuts Waffles, pancakes   List II: Food with moderate purine content (50-150 mg of purine nitrogen per 100 grams of food)  Limit total amount each day to 5 oz. cooked Lean meat, other than those on list III  Poultry, other than those on list III Fish, other than those on list III   Seafood, other than those on list III  These foods may be used occasionally  Peas Lentils Bran  Spinach Oatmeal Dried Beans and Peas  Asparagus Wheat Germ Mushrooms   Additional information about meat choices  Choose fish and poultry, particularly without skin, often.  Select lean, well trimmed cuts of meat.  Avoid all fatty meats, bacon , sausage, fried meats, fried fish, or poultry, luncheon meats, cold cuts, hot dogs, meats canned or frozen in gravy, spareribs and frozen and packaged prepared meats.   List III: Foods with HIGH purine content / Foods to AVOID (150-800 mg of purine nitrogen per 100 grams of food)  Anchovies Herring Meat Broths  Liver Mackerel Meat Extracts  Kidney Scallops Meat Drippings  Sardines Wild Game Mincemeat  Sweetbreads Goose Gravy  Heart Tongue Yeast, baker's and brewers   Commercial soups made with any of the foods listed in List II or List III  In  addition avoid all alcoholic beverages

## 2022-06-13 ENCOUNTER — Other Ambulatory Visit: Payer: Self-pay

## 2022-06-13 MED ORDER — COLCHICINE 0.6 MG PO TABS: 0.6000 mg | ORAL_TABLET | Freq: Every day | ORAL | 0 refills | Status: AC

## 2022-06-13 MED ORDER — ALLOPURINOL 100 MG PO TABS: ORAL_TABLET | ORAL | 0 refills | Status: AC

## 2022-06-13 NOTE — Progress Notes (Unsigned)
Please review and send pended prescriptions for colchicine and allopurinol. Thanks!    FYI----Patient states she was thinking about the questions that were asked at her visit yesterday and would like you to know that she does experiencing mouth flares of mouth ulcers. I advised patient that this could be addressed at the follow up but I would let you know.

## 2022-06-13 NOTE — Progress Notes (Signed)
CBC and CMP WNL RF negative  ESR and CRP WNL Uric acid is 5.0--patient had a recent gout flare and completed a medrol dosepak on Wednesday.    Patient will be restarting allopurinol 100 mg daily and colchicine 0.6 mg 1 tablet daily.   Allopurinol 100 mg daily x1 week then increase to 200 mg daily until follow up  Please send in new prescriptions for both medications.

## 2022-06-15 LAB — CBC WITH DIFFERENTIAL/PLATELET
Absolute Monocytes: 790 {cells}/uL (ref 200–950)
Basophils Absolute: 42 {cells}/uL (ref 0–200)
Basophils Relative: 0.8 %
Eosinophils Absolute: 101 {cells}/uL (ref 15–500)
Eosinophils Relative: 1.9 %
HCT: 43.7 % (ref 35.0–45.0)
Hemoglobin: 14.3 g/dL (ref 11.7–15.5)
Lymphs Abs: 2062 {cells}/uL (ref 850–3900)
MCH: 30.4 pg (ref 27.0–33.0)
MCHC: 32.7 g/dL (ref 32.0–36.0)
MCV: 93 fL (ref 80.0–100.0)
MPV: 9.3 fL (ref 7.5–12.5)
Monocytes Relative: 14.9 %
Neutro Abs: 2306 {cells}/uL (ref 1500–7800)
Neutrophils Relative %: 43.5 %
Platelets: 369 Thousand/uL (ref 140–400)
RBC: 4.7 Million/uL (ref 3.80–5.10)
RDW: 12.2 % (ref 11.0–15.0)
Total Lymphocyte: 38.9 %
WBC: 5.3 Thousand/uL (ref 3.8–10.8)

## 2022-06-15 LAB — COMPLETE METABOLIC PANEL WITH GFR
AG Ratio: 1.4 (calc) (ref 1.0–2.5)
ALT: 12 U/L (ref 6–29)
AST: 11 U/L (ref 10–35)
Albumin: 4 g/dL (ref 3.6–5.1)
Alkaline phosphatase (APISO): 45 U/L (ref 31–125)
BUN: 14 mg/dL (ref 7–25)
CO2: 26 mmol/L (ref 20–32)
Calcium: 9.2 mg/dL (ref 8.6–10.2)
Chloride: 106 mmol/L (ref 98–110)
Creat: 0.99 mg/dL (ref 0.50–0.99)
Globulin: 2.8 g/dL (calc) (ref 1.9–3.7)
Glucose, Bld: 90 mg/dL (ref 65–99)
Potassium: 4.3 mmol/L (ref 3.5–5.3)
Sodium: 139 mmol/L (ref 135–146)
Total Bilirubin: 0.3 mg/dL (ref 0.2–1.2)
Total Protein: 6.8 g/dL (ref 6.1–8.1)
eGFR: 72 mL/min/{1.73_m2} (ref >=60)

## 2022-06-15 LAB — SEDIMENTATION RATE: Sed Rate: 9 mm/h (ref 0–20)

## 2022-06-15 LAB — C-REACTIVE PROTEIN: CRP: 4.4 mg/L

## 2022-06-15 LAB — RHEUMATOID FACTOR: Rheumatoid fact SerPl-aCnc: 10 [IU]/mL

## 2022-06-15 LAB — URIC ACID: Uric Acid, Serum: 5 mg/dL (ref 2.5–7.0)

## 2022-06-15 LAB — CYCLIC CITRUL PEPTIDE ANTIBODY, IGG: Cyclic Citrullin Peptide Ab: <16 U

## 2022-06-16 NOTE — Progress Notes (Signed)
Anti-CCP negative.

## 2022-06-16 NOTE — Telephone Encounter (Signed)
This was completed on 06/13/2022.

## 2022-06-24 NOTE — Progress Notes (Deleted)
Office Visit Note  Patient: Paula Cervantes             Date of Birth: 1975/07/28           MRN: [id number]             PCP: Lezlie Lye, Meda Coffee, MD Referring: Lezlie Lye, Meda Coffee, * Visit Date: 06/26/2022 Occupation: @GUAROCC @  Subjective:  Discuss results   History of Present Illness: Poet Linke is a 47 y.o. female with history of chondrocalcinosis, gout, and osteoarthritis.  Patient presents today to discuss x-rays and lab results.   CBC and CMP WNL on 06/12/22.  RF and anti-CCP negative.  ESR and CRP WNL. Uric acid 5.0 on 06/12/22.   Activities of Daily Living:  Patient reports morning stiffness for *** {minute/hour:19697}.   Patient {ACTIONS;DENIES/REPORTS:21021675::"Denies"} nocturnal pain.  Difficulty dressing/grooming: {ACTIONS;DENIES/REPORTS:21021675::"Denies"} Difficulty climbing stairs: {ACTIONS;DENIES/REPORTS:21021675::"Denies"} Difficulty getting out of chair: {ACTIONS;DENIES/REPORTS:21021675::"Denies"} Difficulty using hands for taps, buttons, cutlery, and/or writing: {ACTIONS;DENIES/REPORTS:21021675::"Denies"}  No Rheumatology ROS completed.   PMFS History:  Patient Active Problem List   Diagnosis Date Noted   Abnormal finding of blood chemistry, unspecified 05/13/2021   Colon cancer screening 05/13/2021   Obesity 05/13/2021   Pre-diabetes 10/26/2018   Dysmenorrhea 01/07/2018   Status post laparoscopic hysterectomy 01/07/2018   Iron deficiency anemia due to chronic blood loss 10/07/2017   DUB (dysfunctional uterine bleeding) 10/07/2017   Fatigue 10/07/2017   History of colonic polyps 10/07/2017   Migraine 08/23/2014   S/P repeat low transverse C-section 02/09/2014    Past Medical History:  Diagnosis Date   Abnormal Pap smear    no cervical cancer, just h/o cryotherapy   Anemia    Arthritis    knees   Asthma    hx as a child, no problems as adult, no inhaler   Endometriosis    Fibroids    Gout    knees   Headache    Heartburn during  pregnancy    History of hiatal hernia    Hypothyroidism    Lactose intolerance     Family History  Problem Relation Age of Onset   Other Mother        joint replacements   Diabetes Mother    Hypertension Mother    Hyperlipidemia Mother    Other Maternal Aunt        joint replacements   Heart disease Maternal Grandmother    Other Maternal Grandfather        joint replacements   Healthy Son    Healthy Daughter    Alcohol abuse Neg Hx    Arthritis Neg Hx    Asthma Neg Hx    Birth defects Neg Hx    Cancer Neg Hx    COPD Neg Hx    Depression Neg Hx    Drug abuse Neg Hx    Early death Neg Hx    Hearing loss Neg Hx    Kidney disease Neg Hx    Learning disabilities Neg Hx    Mental illness Neg Hx    Mental retardation Neg Hx    Miscarriages / Stillbirths Neg Hx    Stroke Neg Hx    Vision loss Neg Hx    Varicose Veins Neg Hx    Past Surgical History:  Procedure Laterality Date   CESAREAN SECTION N/A 09/20/2012   Procedure: PRIMARY CESAREAN SECTION;  Surgeon: Meriel Pica, MD;  Location: [facility];  Service: Obstetrics;  Laterality: N/A;  Primary edc 8/25  CESAREAN SECTION WITH BILATERAL TUBAL LIGATION Bilateral 02/09/2014   Procedure: CESAREAN SECTION WITH BILATERAL TUBAL LIGATION;  Surgeon: Oliver Pila, MD;  Location: [facility];  Service: Obstetrics;  Laterality: Bilateral;   COLONOSCOPY     polyps   CRYOTHERAPY     CYSTOSCOPY N/A 01/07/2018   Procedure: CYSTOSCOPY;  Surgeon: Edwinna Areola, DO;  Location: [facility];  Service: Gynecology;  Laterality: N/A;   DIAGNOSTIC LAPAROSCOPY     KNEE SURGERY Bilateral    x 3 arthroscopic knee   ovarian fibroids     TOTAL HIP ARTHROPLASTY Bilateral    09/06/2021, 12/07/2017   TOTAL LAPAROSCOPIC HYSTERECTOMY WITH SALPINGECTOMY Bilateral 01/07/2018   Procedure: TOTAL LAPAROSCOPIC HYSTERECTOMY WITH SALPINGECTOMY;  Surgeon: Edwinna Areola, DO;  Location: [facility];  Service: Gynecology;  Laterality: Bilateral;   TUBAL  LIGATION  2016   UPPER GI ENDOSCOPY     WISDOM TOOTH EXTRACTION     Social History   Social History Narrative   Not on file   Immunization History  Administered Date(s) Administered   Influenza Inj Mdck Quad Pf 12/22/2017   Influenza,inj,Quad PF,6+ Mos 10/25/2018   Tdap 07/02/2012     Objective: Vital Signs: LMP 01/07/2017    Physical Exam Vitals and nursing note reviewed.  Constitutional:      Appearance: She is well-developed.  HENT:     Head: Normocephalic and atraumatic.  Eyes:     Conjunctiva/sclera: Conjunctivae normal.  Cardiovascular:     Rate and Rhythm: Normal rate and regular rhythm.     Heart sounds: Normal heart sounds.  Pulmonary:     Effort: Pulmonary effort is normal.     Breath sounds: Normal breath sounds.  Abdominal:     General: Bowel sounds are normal.     Palpations: Abdomen is soft.  Musculoskeletal:     Cervical back: Normal range of motion.  Lymphadenopathy:     Cervical: No cervical adenopathy.  Skin:    General: Skin is warm and dry.     Capillary Refill: Capillary refill takes less than 2 seconds.  Neurological:     Mental Status: She is alert and oriented to person, place, and time.  Psychiatric:        Behavior: Behavior normal.      Musculoskeletal Exam: ***  CDAI Exam: CDAI Score: -- Patient Global: --; Provider Global: -- Swollen: --; Tender: -- Joint Exam 06/26/2022   No joint exam has been documented for this visit   There is currently no information documented on the homunculus. Go to the Rheumatology activity and complete the homunculus joint exam.  Investigation: No additional findings.  Imaging: XR Foot 2 Views Left  Result Date: 06/16/2022 First MTP, PIP and DIP narrowing was noted.  No intertarsal, tibiotalar or subtalar joint space narrowing was noted.  Inferior calcaneal spur was noted.  No erosive changes were noted. Impression: These findings are consistent with osteoarthritis of the foot.  XR KNEE 3  VIEW LEFT  Result Date: 06/16/2022 Moderate medial compartment narrowing with intercondylar osteophytes was noted.  Mild patellofemoral narrowing was noted.  Chondrocalcinosis was noted. Impression: These findings are consistent with moderate osteoarthritis and mild chondromalacia patella.  Chondrocalcinosis was noted.  XR KNEE 3 VIEW RIGHT  Result Date: 06/16/2022 Moderate medial compartment narrowing with medial and intercondylar osteophytes was noted.  Mild patellofemoral narrowing was noted.  Significant chondrocalcinosis was noted. Impression: These findings are consistent with moderate osteoarthritis and mild chondromalacia patella of the knee.  Chondrocalcinosis was  noted.  XR Hand 2 View Left  Result Date: 06/16/2022 No CMC, MCP, PIP or DIP narrowing was noted.  No intercarpal or radiocarpal joint space narrowing was noted.  No erosive changes were noted. Impression: Unremarkable x-rays of the hand.  XR Hand 2 View Right  Result Date: 06/16/2022 No CMC, MCP, PIP or DIP narrowing was noted.  No intercarpal or radiocarpal joint space narrowing was noted.  No erosive changes were noted. Impression: Unremarkable x-rays of the hand.   Recent Labs: Lab Results  Component Value Date   WBC 5.3 06/12/2022   HGB 14.3 06/12/2022   PLT 369 06/12/2022   NA 139 06/12/2022   K 4.3 06/12/2022   CL 106 06/12/2022   CO2 26 06/12/2022   GLUCOSE 90 06/12/2022   BUN 14 06/12/2022   CREATININE 0.99 06/12/2022   BILITOT 0.3 06/12/2022   ALKPHOS 61 12/06/2019   AST 11 06/12/2022   ALT 12 06/12/2022   PROT 6.8 06/12/2022   ALBUMIN 4.1 12/06/2019   CALCIUM 9.2 06/12/2022   GFRAA 93 12/06/2019    Speciality Comments: No specialty comments available.  Procedures:  No procedures performed Allergies: Aspirin, Coconut flavor, Other, Penicillins, and Sulfa antibiotics   Assessment / Plan:     Visit Diagnoses: Chondrocalcinosis - previously diagnosed with pseudogout in bilateral knees by Dr.  Dion Saucier.  Primary osteoarthritis of both knees  S/P bilateral hip replacements - Performed by Dr. Dion Saucier  Pain in both hands - XR unremarkable on 06/12/22  Pain in left foot  Idiopathic chronic gout of multiple sites without tophus - diagnosed with gout at the age of 25.  Chronic left shoulder pain - Dr. Dion Saucier. MRI arthrogram 05/21/2022 moderate distal supraspinatus tendinosis-tear at the footprint.Mild distal infraspinatus tendinosis.  Orders: No orders of the defined types were placed in this encounter.  No orders of the defined types were placed in this encounter.     Follow-Up Instructions: No follow-ups on file.   Gearldine Bienenstock, PA-C  Note - This record has been created using Dragon software.  Chart creation errors have been sought, but may not always  have been located. Such creation errors do not reflect on  the standard of medical care.

## 2022-06-26 ENCOUNTER — Ambulatory Visit: Payer: BC Managed Care – PPO | Admitting: Physician Assistant

## 2022-06-26 DIAGNOSIS — G8929 Other chronic pain: Secondary | ICD-10-CM

## 2022-06-26 DIAGNOSIS — M170 Bilateral primary osteoarthritis of knee: Secondary | ICD-10-CM

## 2022-06-26 DIAGNOSIS — M1A09X0 Idiopathic chronic gout, multiple sites, without tophus (tophi): Secondary | ICD-10-CM

## 2022-06-26 DIAGNOSIS — M79672 Pain in left foot: Secondary | ICD-10-CM

## 2022-06-26 DIAGNOSIS — M79641 Pain in right hand: Secondary | ICD-10-CM

## 2022-06-26 DIAGNOSIS — M1120 Other chondrocalcinosis, unspecified site: Secondary | ICD-10-CM

## 2022-06-26 DIAGNOSIS — Z96643 Presence of artificial hip joint, bilateral: Secondary | ICD-10-CM

## 2022-07-03 DIAGNOSIS — Z6832 Body mass index (BMI) 32.0-32.9, adult: Secondary | ICD-10-CM | POA: Diagnosis not present

## 2022-07-03 DIAGNOSIS — L253 Unspecified contact dermatitis due to other chemical products: Secondary | ICD-10-CM | POA: Diagnosis not present

## 2022-07-15 DIAGNOSIS — F430 Acute stress reaction: Secondary | ICD-10-CM | POA: Diagnosis not present

## 2022-07-15 DIAGNOSIS — Z1331 Encounter for screening for depression: Secondary | ICD-10-CM | POA: Diagnosis not present

## 2022-07-15 DIAGNOSIS — F331 Major depressive disorder, recurrent, moderate: Secondary | ICD-10-CM | POA: Diagnosis not present

## 2022-07-15 DIAGNOSIS — Z6831 Body mass index (BMI) 31.0-31.9, adult: Secondary | ICD-10-CM | POA: Diagnosis not present

## 2022-08-19 DIAGNOSIS — Z139 Encounter for screening, unspecified: Secondary | ICD-10-CM | POA: Diagnosis not present

## 2022-08-19 DIAGNOSIS — Z6832 Body mass index (BMI) 32.0-32.9, adult: Secondary | ICD-10-CM | POA: Diagnosis not present

## 2022-08-19 DIAGNOSIS — Z1331 Encounter for screening for depression: Secondary | ICD-10-CM | POA: Diagnosis not present

## 2022-08-19 DIAGNOSIS — F331 Major depressive disorder, recurrent, moderate: Secondary | ICD-10-CM | POA: Diagnosis not present

## 2022-08-19 DIAGNOSIS — F430 Acute stress reaction: Secondary | ICD-10-CM | POA: Diagnosis not present

## 2022-09-28 DIAGNOSIS — M25561 Pain in right knee: Secondary | ICD-10-CM | POA: Diagnosis not present

## 2022-10-22 DIAGNOSIS — F430 Acute stress reaction: Secondary | ICD-10-CM | POA: Diagnosis not present

## 2022-11-14 DIAGNOSIS — M25561 Pain in right knee: Secondary | ICD-10-CM | POA: Diagnosis not present

## 2023-02-09 ENCOUNTER — Ambulatory Visit (INDEPENDENT_AMBULATORY_CARE_PROVIDER_SITE_OTHER): Payer: BC Managed Care – PPO

## 2023-02-09 ENCOUNTER — Ambulatory Visit: Payer: BC Managed Care – PPO | Admitting: Podiatry

## 2023-02-09 DIAGNOSIS — M21622 Bunionette of left foot: Secondary | ICD-10-CM

## 2023-02-09 DIAGNOSIS — M7741 Metatarsalgia, right foot: Secondary | ICD-10-CM | POA: Diagnosis not present

## 2023-02-09 DIAGNOSIS — M7742 Metatarsalgia, left foot: Secondary | ICD-10-CM

## 2023-02-10 NOTE — Progress Notes (Signed)
 Subjective:   Patient ID: Paula Cervantes, female   DOB: 48 y.o.   MRN: [id number]   HPI Patient presents stating she is having severe pain underneath her left foot and she wants to know if there could be any kind of way to cure her or to make the problem less painful   ROS      Objective:  Physical Exam  Neurovascular status intact chronic lesion formation plantar aspect left fifth metatarsal head underneath the tibial sesamoid left and in the left hallux at the inner phalangeal joint.  All of them are very tender with the fifth metatarsal being the worst and it has been present for several years with previous trimmings and padding has not been effective     Assessment:  Chronic lesion formation with bony structural issues contributory to the problem     Plan:  H&P reviewed and I have recommended fifth metatarsal head resection removal of the tibial sesamoid and possible inner phalangeal joint sesamoid resection along with aggressive plantar resection of lesions.  I allowed her to read a consent form for correction spent a great deal of time going over with her no guarantee this will solve the problem all possible complications and the surgical recovery time.  She will be in boot for about 3 to 4 weeks total recovery.  Take 4 to 6 months and at this point air fracture walker dispensed with all instructions on usage fitted properly to her lower leg and I want her to get used to it prior to surgery  X-rays indicate that there is lesions on the head of the fifth metatarsal and on the tibial sesamoid left and in the left inner phalangeal joint with markers

## 2023-02-11 ENCOUNTER — Telehealth: Payer: Self-pay | Admitting: Urology

## 2023-02-11 NOTE — Telephone Encounter (Signed)
 Called pt to schedule her sx with Dr. Magdalen. Pt stated she would like a quote first before scheduling. I told pt I would send the information to billing and once she gets that information if she wants to move forward with scheduling to call back and we will schedule her sx. I emailed the information to the billing dept.

## 2023-03-24 DIAGNOSIS — F430 Acute stress reaction: Secondary | ICD-10-CM | POA: Diagnosis not present

## 2023-03-24 DIAGNOSIS — F331 Major depressive disorder, recurrent, moderate: Secondary | ICD-10-CM | POA: Diagnosis not present

## 2023-04-01 DIAGNOSIS — D225 Melanocytic nevi of trunk: Secondary | ICD-10-CM | POA: Diagnosis not present

## 2023-04-01 DIAGNOSIS — D2371 Other benign neoplasm of skin of right lower limb, including hip: Secondary | ICD-10-CM | POA: Diagnosis not present

## 2023-04-01 DIAGNOSIS — D2262 Melanocytic nevi of left upper limb, including shoulder: Secondary | ICD-10-CM | POA: Diagnosis not present

## 2023-04-01 DIAGNOSIS — L918 Other hypertrophic disorders of the skin: Secondary | ICD-10-CM | POA: Diagnosis not present

## 2023-04-15 ENCOUNTER — Emergency Department (HOSPITAL_COMMUNITY)

## 2023-04-15 ENCOUNTER — Emergency Department (HOSPITAL_COMMUNITY)
Admission: EM | Admit: 2023-04-15 | Discharge: 2023-04-15 | Discharge status: Against Medical Advice | Attending: Emergency Medicine | Admitting: Emergency Medicine

## 2023-04-15 ENCOUNTER — Encounter (HOSPITAL_COMMUNITY): Payer: Self-pay | Admitting: Emergency Medicine

## 2023-04-15 ENCOUNTER — Other Ambulatory Visit: Payer: Self-pay

## 2023-04-15 DIAGNOSIS — Z5321 Procedure and treatment not carried out due to patient leaving prior to being seen by health care provider: Secondary | ICD-10-CM | POA: Diagnosis not present

## 2023-04-15 DIAGNOSIS — M542 Cervicalgia: Secondary | ICD-10-CM | POA: Diagnosis not present

## 2023-04-15 DIAGNOSIS — M25512 Pain in left shoulder: Secondary | ICD-10-CM | POA: Insufficient documentation

## 2023-04-15 NOTE — ED Triage Notes (Signed)
 Pt c/o left shoulder pain x 1 day. Reports pain 10/10 and that she can't move or sleep on that side. Hx dislocations.

## 2023-04-17 ENCOUNTER — Encounter (HOSPITAL_COMMUNITY): Payer: Self-pay

## 2023-04-17 ENCOUNTER — Emergency Department (HOSPITAL_COMMUNITY)

## 2023-04-17 ENCOUNTER — Emergency Department (HOSPITAL_COMMUNITY): Admission: EM | Admit: 2023-04-17 | Discharge: 2023-04-17 | Disposition: A | Discharge status: Home / Self Care

## 2023-04-17 DIAGNOSIS — E039 Hypothyroidism, unspecified: Secondary | ICD-10-CM | POA: Diagnosis not present

## 2023-04-17 DIAGNOSIS — M25512 Pain in left shoulder: Secondary | ICD-10-CM | POA: Insufficient documentation

## 2023-04-17 DIAGNOSIS — M549 Dorsalgia, unspecified: Secondary | ICD-10-CM | POA: Diagnosis not present

## 2023-04-17 DIAGNOSIS — R0789 Other chest pain: Secondary | ICD-10-CM | POA: Diagnosis not present

## 2023-04-17 DIAGNOSIS — R079 Chest pain, unspecified: Secondary | ICD-10-CM | POA: Diagnosis not present

## 2023-04-17 DIAGNOSIS — M62830 Muscle spasm of back: Secondary | ICD-10-CM

## 2023-04-17 DIAGNOSIS — M542 Cervicalgia: Secondary | ICD-10-CM | POA: Diagnosis not present

## 2023-04-17 DIAGNOSIS — M546 Pain in thoracic spine: Secondary | ICD-10-CM | POA: Diagnosis not present

## 2023-04-17 LAB — CBC
HCT: 41.3 % (ref 36.0–46.0)
Hemoglobin: 13.5 g/dL (ref 12.0–15.0)
MCH: 30.4 pg (ref 26.0–34.0)
MCHC: 32.7 g/dL (ref 30.0–36.0)
MCV: 93 fL (ref 80.0–100.0)
Platelets: 360 10*3/uL (ref 150–400)
RBC: 4.44 MIL/uL (ref 3.87–5.11)
RDW: 13 % (ref 11.5–15.5)
WBC: 11 10*3/uL — ABNORMAL HIGH (ref 4.0–10.5)
nRBC: 0 % (ref 0.0–0.2)

## 2023-04-17 LAB — BASIC METABOLIC PANEL
Anion gap: 10 (ref 5–15)
BUN: 14 mg/dL (ref 6–20)
CO2: 21 mmol/L — ABNORMAL LOW (ref 22–32)
Calcium: 9 mg/dL (ref 8.9–10.3)
Chloride: 107 mmol/L (ref 98–111)
Creatinine, Ser: 0.85 mg/dL (ref 0.44–1.00)
GFR, Estimated: >60 mL/min (ref >60)
Glucose, Bld: 106 mg/dL — ABNORMAL HIGH (ref 70–99)
Potassium: 3.7 mmol/L (ref 3.5–5.1)
Sodium: 138 mmol/L (ref 135–145)

## 2023-04-17 LAB — TROPONIN I (HIGH SENSITIVITY): Troponin I (High Sensitivity): 3 ng/L (ref <18)

## 2023-04-17 MED ORDER — CYCLOBENZAPRINE HCL 10 MG PO TABS: 10.0000 mg | ORAL_TABLET | Freq: Two times a day (BID) | ORAL | 0 refills | Status: DC | PRN

## 2023-04-17 MED ORDER — HYDROCODONE-ACETAMINOPHEN 5-325 MG PO TABS: 1.0000 | ORAL_TABLET | Freq: Four times a day (QID) | ORAL | 0 refills | Status: DC | PRN

## 2023-04-17 MED ORDER — LIDOCAINE 5 % EX PTCH
1.0000 | MEDICATED_PATCH | CUTANEOUS | Status: DC
  Administered 2023-04-17: 1 via TRANSDERMAL
  Filled 2023-04-17: qty 1

## 2023-04-17 MED ORDER — HYDROCODONE-ACETAMINOPHEN 5-325 MG PO TABS
1.0000 | ORAL_TABLET | Freq: Once | ORAL | Status: AC
  Administered 2023-04-17: 1 via ORAL
  Filled 2023-04-17: qty 1

## 2023-04-17 NOTE — ED Provider Notes (Signed)
 Accepted handoff at shift change from [facility] PA-C. Please see prior provider note for more detail.   Briefly: Patient is 48 y.o.   DDX: concern for Romboid pain a few days ago, no injury. Been taking steroids, robaxin, tylenol without significant improvement. Chest pain workup due to laterality on left. Ortho follow up if nothing abnormal on chest x ray.   Plan: I independently interpreted imaging including plain film chest xray which shows no acute intrathoracic abnoramlity, mild scoliosis of thoracic spine. I agree with the radiologist interpretation.  On reassessment patient still having some pain but overall improved compared to arrival, no cardiac etiology for her pain, physical exam consistent with muscle spasm in the thoracic spine.  Agree with plan by previous provider to trial changing her muscle relaxant, encouraging close orthopedic follow-up.  Patient discharged in stable condition.     Olene Floss, PA-C 04/17/23 1610    Coral Spikes, DO 04/17/23 [phone]

## 2023-04-17 NOTE — ED Provider Notes (Signed)
 [place] EMERGENCY DEPARTMENT AT [facility] Provider Note   CSN: [id number] Arrival date & time: 04/17/23  0453     History  Chief Complaint  Patient presents with   Arm Pain    Paula Cervantes is a 48 y.o. female history of anemia, AV, hypothyroidism presented for left shoulder pain and left sided neck pain for the past 3 days.  Patient states that pain began when she was sitting down.  Patient show prednisone, OTC use, chiropractor with no relief.  Patient sees Paula Cervantes for orthopedics.  Patient denies any weakness, vision changes, chest pain, shortness of breath, any heavy lifting.  Home Medications Prior to Admission medications   Medication Sig Start Date End Date Taking? Authorizing Provider  cyclobenzaprine (FLEXERIL) 10 MG tablet Take 1 tablet (10 mg total) by mouth 2 (two) times daily as needed for muscle spasms. 04/17/23  Yes Tyhir Schwan, Beverly Gust, PA-C  albuterol (VENTOLIN HFA) 108 (90 Base) MCG/ACT inhaler Inhale 2 puffs into the lungs every 6 (six) hours as needed for wheezing or shortness of breath. 03/01/20   Janeece Agee, NP  allopurinol (ZYLOPRIM) 100 MG tablet Take 100mg  by mouth daily for 7 days and then increase to 200mg  by mouth daily until follow up on 06/26/2022. 06/13/22   Gearldine Bienenstock, PA-C  chlorpheniramine-HYDROcodone (TUSSIONEX PENNKINETIC ER) 10-8 MG/5ML SUER Take 5 mLs by mouth every 12 (twelve) hours as needed for cough. 02/01/20   LampteyBritta Mccreedy, MD  colchicine 0.6 MG tablet Take 1 tablet (0.6 mg total) by mouth daily. 06/13/22   Gearldine Bienenstock, PA-C  FLUoxetine (PROZAC) 10 MG capsule Take 1 capsule (10 mg total) by mouth daily. After 2 weeks, increase to 2 capsules (20mg  total) by mouth daily. Take in the morning. 01/04/20   Janeece Agee, NP  guaiFENesin-dextromethorphan (ROBITUSSIN DM) 100-10 MG/5ML syrup Take 5 mLs by mouth every 4 (four) hours as needed for cough. 02/08/20   Janeece Agee, NP  HYDROcodone-homatropine [facility]) 5-1.5  MG/5ML syrup Take 5 mLs by mouth at bedtime as needed for cough. 02/08/20   Janeece Agee, NP  nystatin cream (MYCOSTATIN) Apply topically 2 (two) times daily. 04/09/21   [provider]  phentermine 30 MG capsule Take 1 capsule (30 mg total) by mouth every morning. 01/04/20   Janeece Agee, NP  predniSONE (STERAPRED UNI-PAK 21 TAB) 10 MG (21) TBPK tablet Take per package instructions. Do not skip doses. Finish entire supply. Patient not taking: Reported on 06/12/2022 04/30/22   Delories Heinz, DPM  promethazine (PHENERGAN) 25 MG tablet Take 1 tablet (25 mg total) by mouth every 6 (six) hours as needed for nausea or vomiting. 02/01/20   Lamptey, Britta Mccreedy, MD  sertraline (ZOLOFT) 50 MG tablet Take 50 mg by mouth daily. 07/31/20   [provider]  traZODone (DESYREL) 50 MG tablet Take 0.5-1 tablets (25-50 mg total) by mouth at bedtime as needed for sleep. 01/04/20   Janeece Agee, NP      Allergies    Aspirin, Coconut flavoring agent (non-screening), Other, Penicillins, and Sulfa antibiotics    Review of Systems   Review of Systems  Physical Exam Updated Vital Signs BP (!) 162/95   Pulse 84   Temp 98 F (36.7 C) (Oral)   Resp 18   LMP 01/07/2017   SpO2 98%  Physical Exam Constitutional:      General: She is not in acute distress. Cardiovascular:     Rate and Rhythm: Normal rate and regular rhythm.  Pulses: Normal pulses.     Heart sounds: Normal heart sounds.  Pulmonary:     Effort: Pulmonary effort is normal. No respiratory distress.     Breath sounds: Normal breath sounds.  Musculoskeletal:        General: Normal range of motion.       Arms:     Cervical back: Normal range of motion. No tenderness.     Comments: Point tenderness on left rhomboid muscle without midline tenderness or abnormalities 5 out of 5 bilateral grip strength, elbow extension/flexion, reassuring hand ability test, negative empty can test No midline tenderness or abnormalities noted   Skin:    General: Skin is warm and dry.     Capillary Refill: Capillary refill takes less than 2 seconds.  Neurological:     General: No focal deficit present.     Mental Status: She is alert and oriented to person, place, and time.     ED Results / Procedures / Treatments   Labs (all labs ordered are listed, but only abnormal results are displayed) Labs Reviewed  BASIC METABOLIC PANEL - Abnormal; Notable for the following components:      Result Value   CO2 21 (*)    Glucose, Bld 106 (*)    All other components within normal limits  CBC - Abnormal; Notable for the following components:   WBC 11.0 (*)    All other components within normal limits  TROPONIN I (HIGH SENSITIVITY)    EKG None  Radiology No results found.  Procedures Procedures    Medications Ordered in ED Medications  lidocaine (LIDODERM) 5 % 1 patch (1 patch Transdermal Patch Applied 04/17/23 0534)  HYDROcodone-acetaminophen (NORCO/VICODIN) 5-325 MG per tablet 1 tablet (1 tablet Oral Given 04/17/23 0534)    ED Course/ Medical Decision Making/ A&P Clinical Course as of 04/17/23 0636  Fri Apr 17, 2023  0631 Romboid pain a few days ago, no injury. Been taking steroids, robaxin, tylenol without significant improvement. Chest pain workup due to laterality on left. Ortho follow up if nothing abnormal on chest x ray.  [CP]    Clinical Course User Index [CP] Olene Floss, PA-C                                 Medical Decision Making Amount and/or Complexity of Data Reviewed Labs: ordered. Radiology: ordered.  Risk Prescription drug management.   [name] 48 y.o. presented today for neck pain. Working DDx that I considered at this time includes, but not limited to, MSK, torticollis, dystonic reaction, cervical spondylosis, epidural abscess, myelitis, disc herniation, cervical radiculopathy, carotid/vertebral artery dissection, spinal cord injury, myelitis.  R/o DDx: pending  Review of  prior external notes: 11/14/2022  Unique Tests and My Independent Interpretation:  CBC: unremarkable BMP: Unremarkable Troponin: 3 Chest x-ray:pending EKG: Sinus 76 bpm, new T wave inversions in lead III but no signs of right heart strain or ST elevations indicative of ischemia  Social Determinants of Health: none  Discussion with Independent Historian: None  Discussion of Management of Tests: None  Risk: Medium: prescription drug management  Risk Stratification Score: None  Plan: On exam patient was no acute distress but does appear uncomfortable.  Patient did not endorse history of recent neck or dental surgeries.  Patient did states she went to a chiropractor and had neck manipulation performed after the neck pain began but states that the pain is not better  or worse since then.  On exam patient does have point tenderness to the left rhomboid muscle suspicious of possible muscle spasm.  Patient states she does take methocarbamol and that has not helped and so we discussed possibility of switching out her muscle relaxer for Flexeril.  Will give lidocaine patch here and prescribe.  Chest pain labs ordered to rule out ACS however given the fact that patient has point tenderness on exam and endorsing a chest pain does not have cardiac history feel this is MSK in nature and will have patient follow-up with her orthopedist.  Left shoulder x-ray done a few days ago was negative and so will not repeat this.  Labs are reassuring.  Patient is not endorsing any chest pain and this has been going for 3 days so do not need second troponin as it should have already been elevated.  Patient signed out to Prosperi, PA-C.  Please review their note for the continuation of patient's care.  The plan at this point is follow up on imaging and discharge if negative.  This chart was dictated using voice recognition software.  Despite best efforts to proofread,  errors can occur which can change the  documentation meaning.         Final Clinical Impression(s) / ED Diagnoses Final diagnoses:  None    Rx / DC Orders ED Discharge Orders          Ordered    cyclobenzaprine (FLEXERIL) 10 MG tablet  2 times daily PRN        04/17/23 0635              Netta Corrigan, PA-C 04/17/23 0636    Coral Spikes, DO 04/17/23 [phone]

## 2023-04-17 NOTE — Discharge Instructions (Addendum)
 Please follow-up with your primary care provider in regards to recent ER visit. Today's physical exam and imaging was reassuring and you most likely have a benign process going on. You may rest, ice, compress, elevate your extremity and use Tylenol every 6 hours as needed for pain. I have prescribed you Flexeril.  This is a muscle relaxer and may make you drowsy so please do not operate machinery or drive afterwards.  Please do not take your Robaxin with this. If you begin to have decreased sensation, skin color changes, pain out of proportion/not controlled by over-the-counter medications, rigid compartments, or other worsening of symptoms please return to ER.  You can use the stronger narcotic pain medication in place of Tylenol for severe break through pain.  If you take the narcotic pain medication that we prescribed for severe breakthrough pain, I recommend that you also take a laxative such as MiraLAX or Dulcolax every day that you take the narcotic pain medicine, and drink plenty of fluids, 50 to 64 ounces to prevent any constipation.

## 2023-04-17 NOTE — ED Triage Notes (Signed)
 Pt states that she has been having pain in her L arm for the past 3 days, pain is in her neck as well, denies CP, has seen ortho doc and chiropractor without relief, has tried steroids as well. Pt has full ROM of arm.

## 2023-04-18 DIAGNOSIS — M542 Cervicalgia: Secondary | ICD-10-CM | POA: Diagnosis not present

## 2023-04-20 DIAGNOSIS — M542 Cervicalgia: Secondary | ICD-10-CM | POA: Diagnosis not present

## 2023-04-21 ENCOUNTER — Ambulatory Visit
Admission: EM | Admit: 2023-04-21 | Discharge: 2023-04-21 | Disposition: A | Discharge status: Home / Self Care | Attending: Internal Medicine | Admitting: Internal Medicine

## 2023-04-21 DIAGNOSIS — M546 Pain in thoracic spine: Secondary | ICD-10-CM

## 2023-04-21 MED ORDER — HYDROCODONE-ACETAMINOPHEN 5-325 MG PO TABS: 1.0000 | ORAL_TABLET | Freq: Four times a day (QID) | ORAL | 0 refills | Status: DC | PRN

## 2023-04-21 NOTE — ED Triage Notes (Addendum)
 Pt presents with neck and shoulder pain. Pain originates in neck and radiates down to shoulder. Symptoms onset about a week ago. Pt has tried heat and cold comresses, Toradal inj, and otc tylenol. Symptoms have not improved.

## 2023-04-21 NOTE — Discharge Instructions (Signed)
 I have prescribed you Norco to take as needed.  Please use sparingly.  Do not take with trazodone or other medications that make you sleepy.  It does contain Tylenol as well so do not take any additional Tylenol.  Follow-up with orthopedist.

## 2023-04-21 NOTE — ED Provider Notes (Signed)
 [facility]    CSN: [id number] Arrival date & time: 04/21/23  1559      History   Chief Complaint Chief Complaint  Patient presents with   Torticollis   Shoulder Pain    HPI Paula Cervantes is a 48 y.o. female.   Patient presents with left upper shoulder pain that started about a week or so ago.  Denies injury or chronic pain in that area. Patient went to the ER on 3/12 but left prior to seeing a provider.  An x-ray of the shoulder was completed that was negative for any acute bony abnormality.  Patient went back to ER on 3/14 where chest x-ray was completed which was negative as well.  She had been taking robaxin muscle relaxer so this was switched to cyclobenzaprine which has not been helpful.  She also received a Toradol injection, lidocaine patch, Norco with minimal improvement.  Patient reports that she saw her orthopedist twice this week who did an MRI of her neck.  Reports that they told her that she had pinched nerve.  She was prescribed steroid taper which she has completed with minimal improvement. States that she was referred to a neurosurgeon but does not have an appointment yet.  Patient is a poor historian. Patient is reporting severe pain. Has also seen a chiropractor.    Shoulder Pain   Past Medical History:  Diagnosis Date   Abnormal Pap smear    no cervical cancer, just h/o cryotherapy   Anemia    Arthritis    knees   Asthma    hx as a child, no problems as adult, no inhaler   Endometriosis    Fibroids    Gout    knees   Headache    Heartburn during pregnancy    History of hiatal hernia    Hypothyroidism    Lactose intolerance     Patient Active Problem List   Diagnosis Date Noted   Abnormal finding of blood chemistry, unspecified 05/13/2021   Colon cancer screening 05/13/2021   Obesity 05/13/2021   Pre-diabetes 10/26/2018   Dysmenorrhea 01/07/2018   Status post laparoscopic hysterectomy 01/07/2018   Iron deficiency anemia due to  chronic blood loss 10/07/2017   DUB (dysfunctional uterine bleeding) 10/07/2017   Fatigue 10/07/2017   History of colonic polyps 10/07/2017   Migraine 08/23/2014   S/P repeat low transverse C-section 02/09/2014    Past Surgical History:  Procedure Laterality Date   CESAREAN SECTION N/A 09/20/2012   Procedure: PRIMARY CESAREAN SECTION;  Surgeon: Meriel Pica, MD;  Location: [facility];  Service: Obstetrics;  Laterality: N/A;  Primary edc 8/25   CESAREAN SECTION WITH BILATERAL TUBAL LIGATION Bilateral 02/09/2014   Procedure: CESAREAN SECTION WITH BILATERAL TUBAL LIGATION;  Surgeon: Oliver Pila, MD;  Location: [facility];  Service: Obstetrics;  Laterality: Bilateral;   COLONOSCOPY     polyps   CRYOTHERAPY     CYSTOSCOPY N/A 01/07/2018   Procedure: CYSTOSCOPY;  Surgeon: Edwinna Areola, DO;  Location: [facility];  Service: Gynecology;  Laterality: N/A;   DIAGNOSTIC LAPAROSCOPY     KNEE SURGERY Bilateral    x 3 arthroscopic knee   ovarian fibroids     TOTAL HIP ARTHROPLASTY Bilateral    09/06/2021, 12/07/2017   TOTAL LAPAROSCOPIC HYSTERECTOMY WITH SALPINGECTOMY Bilateral 01/07/2018   Procedure: TOTAL LAPAROSCOPIC HYSTERECTOMY WITH SALPINGECTOMY;  Surgeon: Edwinna Areola, DO;  Location: [facility];  Service: Gynecology;  Laterality: Bilateral;   TUBAL LIGATION  2016  UPPER GI ENDOSCOPY     WISDOM TOOTH EXTRACTION      OB History     Gravida  3   Para  2   Term  2   Preterm      AB  1   Living  2      SAB  1   IAB      Ectopic      Multiple  0   Live Births  2            Home Medications    Prior to Admission medications   Medication Sig Start Date End Date Taking? Authorizing Provider  cyclobenzaprine (FLEXERIL) 10 MG tablet Take 1 tablet (10 mg total) by mouth 2 (two) times daily as needed for muscle spasms. 04/17/23  Yes Netta Corrigan, PA-C  HYDROcodone-acetaminophen (NORCO/VICODIN) 5-325 MG tablet Take 1 tablet by mouth every 6 (six) hours  as needed for severe pain (pain score 7-10). 04/21/23  Yes Irby Fails, Acie Fredrickson, FNP  albuterol (VENTOLIN HFA) 108 (90 Base) MCG/ACT inhaler Inhale 2 puffs into the lungs every 6 (six) hours as needed for wheezing or shortness of breath. 03/01/20   Janeece Agee, NP  allopurinol (ZYLOPRIM) 100 MG tablet Take 100mg  by mouth daily for 7 days and then increase to 200mg  by mouth daily until follow up on 06/26/2022. 06/13/22   Gearldine Bienenstock, PA-C  chlorpheniramine-HYDROcodone (TUSSIONEX PENNKINETIC ER) 10-8 MG/5ML SUER Take 5 mLs by mouth every 12 (twelve) hours as needed for cough. 02/01/20   LampteyBritta Mccreedy, MD  colchicine 0.6 MG tablet Take 1 tablet (0.6 mg total) by mouth daily. 06/13/22   Gearldine Bienenstock, PA-C  FLUoxetine (PROZAC) 10 MG capsule Take 1 capsule (10 mg total) by mouth daily. After 2 weeks, increase to 2 capsules (20mg  total) by mouth daily. Take in the morning. 01/04/20   Janeece Agee, NP  guaiFENesin-dextromethorphan (ROBITUSSIN DM) 100-10 MG/5ML syrup Take 5 mLs by mouth every 4 (four) hours as needed for cough. 02/08/20   Janeece Agee, NP  HYDROcodone-homatropine [facility]) 5-1.5 MG/5ML syrup Take 5 mLs by mouth at bedtime as needed for cough. 02/08/20   Janeece Agee, NP  nystatin cream (MYCOSTATIN) Apply topically 2 (two) times daily. 04/09/21   [provider]  phentermine 30 MG capsule Take 1 capsule (30 mg total) by mouth every morning. 01/04/20   Janeece Agee, NP  predniSONE (STERAPRED UNI-PAK 21 TAB) 10 MG (21) TBPK tablet Take per package instructions. Do not skip doses. Finish entire supply. Patient not taking: Reported on 06/12/2022 04/30/22   Delories Heinz, DPM  promethazine (PHENERGAN) 25 MG tablet Take 1 tablet (25 mg total) by mouth every 6 (six) hours as needed for nausea or vomiting. 02/01/20   Lamptey, Britta Mccreedy, MD  sertraline (ZOLOFT) 50 MG tablet Take 50 mg by mouth daily. 07/31/20   [provider]  traZODone (DESYREL) 50 MG tablet Take 0.5-1 tablets  (25-50 mg total) by mouth at bedtime as needed for sleep. 01/04/20   Janeece Agee, NP    Family History Family History  Problem Relation Age of Onset   Other Mother        joint replacements   Diabetes Mother    Hypertension Mother    Hyperlipidemia Mother    Other Maternal Aunt        joint replacements   Heart disease Maternal Grandmother    Other Maternal Grandfather        joint replacements  Healthy Son    Healthy Daughter    Alcohol abuse Neg Hx    Arthritis Neg Hx    Asthma Neg Hx    Birth defects Neg Hx    Cancer Neg Hx    COPD Neg Hx    Depression Neg Hx    Drug abuse Neg Hx    Early death Neg Hx    Hearing loss Neg Hx    Kidney disease Neg Hx    Learning disabilities Neg Hx    Mental illness Neg Hx    Mental retardation Neg Hx    Miscarriages / Stillbirths Neg Hx    Stroke Neg Hx    Vision loss Neg Hx    Varicose Veins Neg Hx     Social History Social History   Tobacco Use   Smoking status: Former    Current packs/day: 0.00    Types: Cigarettes    Quit date: 03/27/2002    Years since quitting: 21.0    Passive exposure: Never   Smokeless tobacco: Never  Vaping Use   Vaping status: Never Used  Substance Use Topics   Alcohol use: Yes    Comment: occ   Drug use: Yes    Types: Marijuana    Comment: occ     Allergies   Aspirin, Coconut flavoring agent (non-screening), Other, Penicillins, and Sulfa antibiotics   Review of Systems Review of Systems Per HPI  Physical Exam Triage Vital Signs ED Triage Vitals  Encounter Vitals Group     BP 04/21/23 1623 116/79     Systolic BP Percentile --      Diastolic BP Percentile --      Pulse Rate 04/21/23 1623 80     Resp 04/21/23 1623 18     Temp 04/21/23 1623 (!) 97.4 F (36.3 C)     Temp Source 04/21/23 1623 Oral     SpO2 04/21/23 1623 95 %     Weight --      Height --      Head Circumference --      Peak Flow --      Pain Score 04/21/23 1620 10     Pain Loc --      Pain Education --       Exclude from Growth Chart --    No data found.  Updated Vital Signs BP 116/79 (BP Location: Left Arm)   Pulse 80   Temp (!) 97.4 F (36.3 C) (Oral)   Resp 18   LMP 01/07/2017   SpO2 95%   Visual Acuity Right Eye Distance:   Left Eye Distance:   Bilateral Distance:    Right Eye Near:   Left Eye Near:    Bilateral Near:     Physical Exam Constitutional:      General: She is not in acute distress.    Appearance: Normal appearance. She is not toxic-appearing or diaphoretic.  HENT:     Head: Normocephalic and atraumatic.  Eyes:     Extraocular Movements: Extraocular movements intact.     Conjunctiva/sclera: Conjunctivae normal.  Pulmonary:     Effort: Pulmonary effort is normal.  Musculoskeletal:     Comments: Patient has tenderness to palpation overlying the left trapezius that extends into the rhomboid muscle.  Patient has full range of motion of upper extremity.  No tenderness to neck.  No direct spinal tenderness, crepitus, step-off noted.  Full range of motion of neck present.  Neurological:     General: No  focal deficit present.     Mental Status: She is alert and oriented to person, place, and time. Mental status is at baseline.  Psychiatric:        Mood and Affect: Mood normal.        Behavior: Behavior normal.        Thought Content: Thought content normal.        Judgment: Judgment normal.      UC Treatments / Results  Labs (all labs ordered are listed, but only abnormal results are displayed) Labs Reviewed - No data to display  EKG   Radiology No results found.  Procedures Procedures (including critical care time)  Medications Ordered in UC Medications - No data to display  Initial Impression / Assessment and Plan / UC Course  I have reviewed the triage vital signs and the nursing notes.  Pertinent labs & imaging results that were available during my care of the patient were reviewed by me and considered in my medical decision making (see  chart for details).     Physical exam is most consistent with muscle strain but patient is reporting that orthopedist has diagnosed her with a pinched nerve on MRI.  Patient has exhausted all treatment options including steroid therapy, anti-inflammatory, lidocaine patch, Toradol injection, narcotic pain medication.  Discussed with patient limitations on treatment here in urgent care and that follow-up with orthopedist is best next step.  Patient is reporting severe pain.  Therefore, will supply patient with a few more pillsof Norco given this has provided her the most relief and she is currently out of previous prescription from ER. Educated patient I can only provide a few pills and no additional refills will occur. PDMP reviewed.  Although, educated patient this can make her drowsy, to use sparingly, and do not drive or drink alcohol while taking it.  Also educated patient that this contains Tylenol and do not take any additional Tylenol.  Patient advised to follow-up with orthopedist as soon as possible.  She verbalized understanding and was agreeable with plan. Final Clinical Impressions(s) / UC Diagnoses   Final diagnoses:  Acute left-sided thoracic back pain     Discharge Instructions      I have prescribed you Norco to take as needed.  Please use sparingly.  Do not take with trazodone or other medications that make you sleepy.  It does contain Tylenol as well so do not take any additional Tylenol.  Follow-up with orthopedist.    ED Prescriptions     Medication Sig Dispense Auth. Provider   HYDROcodone-acetaminophen (NORCO/VICODIN) 5-325 MG tablet Take 1 tablet by mouth every 6 (six) hours as needed for severe pain (pain score 7-10). 5 tablet [place], [place], [place]      I have reviewed the PDMP during this encounter.   Gustavus Bryant, [place] 04/21/23 [phone]

## 2023-04-23 ENCOUNTER — Encounter (HOSPITAL_COMMUNITY): Payer: Self-pay

## 2023-04-23 ENCOUNTER — Emergency Department (HOSPITAL_COMMUNITY)
Admission: EM | Admit: 2023-04-23 | Discharge: 2023-04-23 | Disposition: A | Discharge status: Home / Self Care | Attending: Emergency Medicine | Admitting: Emergency Medicine

## 2023-04-23 ENCOUNTER — Other Ambulatory Visit: Payer: Self-pay

## 2023-04-23 ENCOUNTER — Emergency Department (HOSPITAL_COMMUNITY)

## 2023-04-23 DIAGNOSIS — M4802 Spinal stenosis, cervical region: Secondary | ICD-10-CM | POA: Insufficient documentation

## 2023-04-23 DIAGNOSIS — M542 Cervicalgia: Secondary | ICD-10-CM | POA: Diagnosis not present

## 2023-04-23 DIAGNOSIS — M5001 Cervical disc disorder with myelopathy,  high cervical region: Secondary | ICD-10-CM | POA: Diagnosis not present

## 2023-04-23 DIAGNOSIS — M47812 Spondylosis without myelopathy or radiculopathy, cervical region: Secondary | ICD-10-CM | POA: Diagnosis not present

## 2023-04-23 MED ORDER — HYDROCODONE-ACETAMINOPHEN 5-325 MG PO TABS
2.0000 | ORAL_TABLET | Freq: Once | ORAL | Status: AC
  Administered 2023-04-23: 2 via ORAL
  Filled 2023-04-23: qty 2

## 2023-04-23 MED ORDER — KETOROLAC TROMETHAMINE 15 MG/ML IJ SOLN
15.0000 mg | Freq: Once | INTRAMUSCULAR | Status: AC
  Administered 2023-04-23: 15 mg via INTRAVENOUS
  Filled 2023-04-23: qty 1

## 2023-04-23 MED ORDER — HYDROCODONE-ACETAMINOPHEN 5-325 MG PO TABS: 1.0000 | ORAL_TABLET | ORAL | 0 refills | Status: DC | PRN

## 2023-04-23 MED ORDER — HYDROMORPHONE HCL 1 MG/ML IJ SOLN
1.0000 mg | Freq: Once | INTRAMUSCULAR | Status: AC
  Administered 2023-04-23: 1 mg via INTRAVENOUS
  Filled 2023-04-23: qty 1

## 2023-04-23 NOTE — ED Provider Notes (Signed)
 [place] EMERGENCY DEPARTMENT AT [facility] Provider Note   CSN: [id number] Arrival date & time: 04/23/23  5784     History  Chief Complaint  Patient presents with   Lt sided Neck Pain   Lt shoulder pain    Paula Cervantes is a 48 y.o. female.  HPI 48 year old female presents with left arm pain and neck pain.  Symptoms started about a week ago.  No injury.  She has seen her orthopedist as well as been to the ED and urgent care.  She has been taking muscle relaxer, hydrocodone, and was started on gabapentin yesterday.  She has been referred to neurosurgery and was told by her orthopedist it was likely a pinched nerve.  She is now getting numbness to her left thumb and forefinger that started today and her left arm feels heavy.  No leg symptoms or incontinence.  Home Medications Prior to Admission medications   Medication Sig Start Date End Date Taking? Authorizing Provider  HYDROcodone-acetaminophen (NORCO/VICODIN) 5-325 MG tablet Take 1 tablet by mouth every 4 (four) hours as needed for severe pain (pain score 7-10). 04/23/23  Yes Pricilla Loveless, MD  albuterol (VENTOLIN HFA) 108 (90 Base) MCG/ACT inhaler Inhale 2 puffs into the lungs every 6 (six) hours as needed for wheezing or shortness of breath. 03/01/20   Janeece Agee, NP  allopurinol (ZYLOPRIM) 100 MG tablet Take 100mg  by mouth daily for 7 days and then increase to 200mg  by mouth daily until follow up on 06/26/2022. 06/13/22   Gearldine Bienenstock, PA-C  chlorpheniramine-HYDROcodone (TUSSIONEX PENNKINETIC ER) 10-8 MG/5ML SUER Take 5 mLs by mouth every 12 (twelve) hours as needed for cough. 02/01/20   LampteyBritta Mccreedy, MD  colchicine 0.6 MG tablet Take 1 tablet (0.6 mg total) by mouth daily. 06/13/22   Gearldine Bienenstock, PA-C  cyclobenzaprine (FLEXERIL) 10 MG tablet Take 1 tablet (10 mg total) by mouth 2 (two) times daily as needed for muscle spasms. 04/17/23   Netta Corrigan, PA-C  FLUoxetine (PROZAC) 10 MG capsule Take 1  capsule (10 mg total) by mouth daily. After 2 weeks, increase to 2 capsules (20mg  total) by mouth daily. Take in the morning. 01/04/20   Janeece Agee, NP  guaiFENesin-dextromethorphan (ROBITUSSIN DM) 100-10 MG/5ML syrup Take 5 mLs by mouth every 4 (four) hours as needed for cough. 02/08/20   Janeece Agee, NP  HYDROcodone-homatropine [facility]) 5-1.5 MG/5ML syrup Take 5 mLs by mouth at bedtime as needed for cough. 02/08/20   Janeece Agee, NP  nystatin cream (MYCOSTATIN) Apply topically 2 (two) times daily. 04/09/21   [provider]  phentermine 30 MG capsule Take 1 capsule (30 mg total) by mouth every morning. 01/04/20   Janeece Agee, NP  predniSONE (STERAPRED UNI-PAK 21 TAB) 10 MG (21) TBPK tablet Take per package instructions. Do not skip doses. Finish entire supply. Patient not taking: Reported on 06/12/2022 04/30/22   Delories Heinz, DPM  promethazine (PHENERGAN) 25 MG tablet Take 1 tablet (25 mg total) by mouth every 6 (six) hours as needed for nausea or vomiting. 02/01/20   Lamptey, Britta Mccreedy, MD  sertraline (ZOLOFT) 50 MG tablet Take 50 mg by mouth daily. 07/31/20   [provider]  traZODone (DESYREL) 50 MG tablet Take 0.5-1 tablets (25-50 mg total) by mouth at bedtime as needed for sleep. 01/04/20   Janeece Agee, NP      Allergies    Aspirin, Coconut flavoring agent (non-screening), Other, Penicillins, and Sulfa antibiotics  Review of Systems   Review of Systems  Constitutional:  Negative for fever.  Musculoskeletal:  Positive for neck pain.  Neurological:  Positive for weakness and numbness.    Physical Exam Updated Vital Signs BP (!) 126/90 (BP Location: Right Arm)   Pulse 78   Temp (!) 97.3 F (36.3 C) (Oral)   Resp 18   Ht 5\' 7"  (1.702 m)   Wt 86.2 kg   LMP 01/07/2017   SpO2 97%   BMI 29.76 kg/m  Physical Exam Vitals and nursing note reviewed.  Constitutional:      Appearance: She is well-developed.  HENT:     Head: Normocephalic and  atraumatic.  Neck:   Cardiovascular:     Rate and Rhythm: Normal rate and regular rhythm.     Pulses:          Radial pulses are 2+ on the left side.  Pulmonary:     Effort: Pulmonary effort is normal.  Abdominal:     General: There is no distension.  Musculoskeletal:     Cervical back: Muscular tenderness present.  Skin:    General: Skin is warm and dry.  Neurological:     Mental Status: She is alert.     Comments: 5/5 strength in RUE. There is weakness to the left upper extremity to resistance, but hard to tell if it's weak vs induces pain. Subjective decreased sensation to left thumb/forefinger.     ED Results / Procedures / Treatments   Labs (all labs ordered are listed, but only abnormal results are displayed) Labs Reviewed - No data to display  EKG None  Radiology MR Cervical Spine Wo Contrast Result Date: 04/23/2023 CLINICAL DATA:  Cervical myelopathy, left-sided neck and shoulder pain. EXAM: MRI CERVICAL SPINE WITHOUT CONTRAST TECHNIQUE: Multiplanar, multisequence MR imaging of the cervical spine was performed. No intravenous contrast was administered. COMPARISON:  No prior images available for direct comparison at the time of dictation. FINDINGS: Alignment: Straightening and slight reversal of the normal cervical lordosis. No listhesis. Vertebrae: Modic type 1 degenerative endplate changes at C6-7 with associated discogenic edema. Vertebral body heights are maintained. No evidence of fracture. No suspicious osseous lesion. Cord: Increased signal within the cervical cord at C3-4. Cord signal and caliber is otherwise unremarkable. Posterior Fossa, vertebral arteries, paraspinal tissues: The visualized posterior fossa is unremarkable. Paraspinal soft tissues are unremarkable. Disc levels: C2-3: Disc bulge eccentric to the left which indents the ventral thecal sac without contacting the spinal cord. Bilateral facet arthrosis. Mild left foraminal stenosis. C3-4: Disc osteophyte  complex indents the ventral thecal sac and contacts the ventral cervical cord with flattening of the cord. Additional thickening of the ligamentum flavum. Moderate to severe spinal canal stenosis at this level with associated cord compression. Subtle cord signal abnormality concerning for myelomalacia versus edema. Facet arthrosis and uncovertebral hypertrophy with moderate right and mild-to-moderate left foraminal stenosis. C4-5: Disc bulge eccentric to the left indents the ventral thecal sac with flattening of the ventral cervical cord. Thickening of the ligamentum flavum. Moderate spinal canal stenosis without evidence of cord compression or cord signal abnormality. Bilateral facet arthrosis. Uncovertebral hypertrophy more pronounced on the right. Mild-to-moderate right and severe left foraminal stenosis. C5-6: Disc osteophyte complex eccentric to the left indents the ventral thecal sac with mild flattening of the ventral cervical cord. Bilateral facet arthrosis. Uncovertebral hypertrophy on the left. Severe left foraminal stenosis. C6-7: Diffuse disc bulge which indents the ventral thecal sac without contacting the spinal cord. Bilateral facet  arthrosis and uncovertebral hypertrophy. Mild left foraminal stenosis. C7-T1: No significant spinal canal stenosis. No significant foraminal stenosis. IMPRESSION: Disc osteophyte complex at C3-4 abuts the ventral cervical cord. Moderate to severe spinal canal stenosis at this level with mild cord compression and cord signal abnormality suggestive of myelomalacia versus edema. No findings to suggest acute disc herniation. Additional moderate spinal canal stenosis at C4-5. Multilevel foraminal stenosis, greatest and severe on the left at C4-5 and C5-6. Degenerative endplate changes and discogenic edema at C6-7 which may contribute to neck pain. Electronically Signed   By: Emily Filbert M.D.   On: 04/23/2023 12:44    Procedures Procedures    Medications Ordered in  ED Medications  HYDROmorphone (DILAUDID) injection 1 mg (1 mg Intravenous Given 04/23/23 1051)  ketorolac (TORADOL) 15 MG/ML injection 15 mg (15 mg Intravenous Given 04/23/23 1051)  HYDROmorphone (DILAUDID) injection 1 mg (1 mg Intravenous Given 04/23/23 1156)  HYDROcodone-acetaminophen (NORCO/VICODIN) 5-325 MG per tablet 2 tablet (2 tablets Oral Given 04/23/23 1418)    ED Course/ Medical Decision Making/ A&P                                 Medical Decision Making Amount and/or Complexity of Data Reviewed Radiology: ordered and independent interpretation performed.    Details: Cervical canal stenosis.  Risk Prescription drug management.   Patient's "weakness" in her left arm seems to be more against resistance and is more likely pain related.  She is doing better from a pain perspective and I discussed her MRI with Dr. Conchita Paris.  He advises that she will need decompressive surgery but not emergently and he will set up an urgent outpatient follow-up.  Otherwise, not much will help besides continuing her gabapentin and treating her for pain.  Steroid should not be helpful.  Will give her a course of Norco and her pain seems controlled enough for her to go home at this time.  Given return precautions.        Final Clinical Impression(s) / ED Diagnoses Final diagnoses:  Cervical stenosis of spinal canal    Rx / DC Orders ED Discharge Orders          Ordered    HYDROcodone-acetaminophen (NORCO/VICODIN) 5-325 MG tablet  Every 4 hours PRN        04/23/23 1405              Pricilla Loveless, MD 04/23/23 1441

## 2023-04-23 NOTE — ED Triage Notes (Signed)
 Pt came in via POV d/t Lt sided neck/shoulder pain for the past week. States she has seen a chiropractor & orthopedics with no relief in her 10/10 pain. States she woke up one morning & it was hurting & has been progressively   worsened that first day the has stayed the same since.

## 2023-04-23 NOTE — Discharge Instructions (Signed)
 Your MRI shows narrowing of your neck bones surrounding her spinal cord.  We are giving you hydrocodone to help with pain.  You may also take Tylenol and ibuprofen.  Follow-up with the neurosurgeon, Dr. Conchita Paris.  His office should give you a call for an appointment on Monday.  If you develop new or worsening or intractable pain, weakness or numbness of your arms or legs, incontinence, or any other new/concerning symptoms then return to the ER or call 911.

## 2023-04-23 NOTE — ED Notes (Addendum)
 Pt off unit to MRI.

## 2023-04-27 DIAGNOSIS — M4722 Other spondylosis with radiculopathy, cervical region: Secondary | ICD-10-CM | POA: Diagnosis not present

## 2023-04-28 ENCOUNTER — Other Ambulatory Visit: Payer: Self-pay | Admitting: Neurosurgery

## 2023-04-28 DIAGNOSIS — M4802 Spinal stenosis, cervical region: Secondary | ICD-10-CM | POA: Diagnosis not present

## 2023-04-28 DIAGNOSIS — Z789 Other specified health status: Secondary | ICD-10-CM | POA: Diagnosis not present

## 2023-04-28 DIAGNOSIS — M5412 Radiculopathy, cervical region: Secondary | ICD-10-CM | POA: Diagnosis not present

## 2023-04-28 DIAGNOSIS — Z6832 Body mass index (BMI) 32.0-32.9, adult: Secondary | ICD-10-CM | POA: Diagnosis not present

## 2023-04-29 ENCOUNTER — Encounter (HOSPITAL_COMMUNITY): Payer: Self-pay

## 2023-04-29 ENCOUNTER — Other Ambulatory Visit: Payer: Self-pay | Admitting: Neurosurgery

## 2023-04-29 NOTE — Pre-Procedure Instructions (Signed)
 Surgical Instructions   Your procedure is scheduled on Friday, March 28th. Report to [facility] Main Entrance "A" at 11:00 A.M., then check in with the Admitting office. Any questions or running late day of surgery: call [phone]  Questions prior to your surgery date: call [phone], Monday-Friday, 8am-4pm. If you experience any cold or flu symptoms such as cough, fever, chills, shortness of breath, etc. between now and your scheduled surgery, please notify us at the above number.     Remember:  Do not eat or drink after midnight the night before your surgery    Take these medicines the morning of surgery with A SIP OF WATER  colchicine  gabapentin (NEURONTIN)   May take these medicines IF NEEDED: albuterol (VENTOLIN HFA)  allopurinol (ZYLOPRIM)  cyclobenzaprine (FLEXERIL)  HYDROcodone-acetaminophen (NORCO/VICODIN)    One week prior to surgery, STOP taking any Aspirin (unless otherwise instructed by your surgeon) Aleve, Naproxen, Ibuprofen, Motrin, Advil, Goody's, BC's, all herbal medications, fish oil, and non-prescription vitamins.                     Do NOT Smoke (Tobacco/Vaping) for 24 hours prior to your procedure.  If you use a CPAP at night, you may bring your mask/headgear for your overnight stay.   You will be asked to remove any contacts, glasses, piercing's, hearing aid's, dentures/partials prior to surgery. Please bring cases for these items if needed.    Patients discharged the day of surgery will not be allowed to drive home, and someone needs to stay with them for 24 hours.  SURGICAL WAITING ROOM VISITATION Patients may have no more than 2 support people in the waiting area - these visitors may rotate.   Pre-op nurse will coordinate an appropriate time for 1 ADULT support person, who may not rotate, to accompany patient in pre-op.  Children under the age of 35 must have an adult with them who is not the patient and must remain in the main waiting area  with an adult.  If the patient needs to stay at the hospital during part of their recovery, the visitor guidelines for inpatient rooms apply.  Please refer to the [facility] website for the visitor guidelines for any additional information.   If you received a COVID test during your pre-op visit  it is requested that you wear a mask when out in public, stay away from anyone that may not be feeling well and notify your surgeon if you develop symptoms. If you have been in contact with anyone that has tested positive in the last 10 days please notify you surgeon.      Pre-operative 5 CHG Bathing Instructions   You can play a key role in reducing the risk of infection after surgery. Your skin needs to be as free of germs as possible. You can reduce the number of germs on your skin by washing with CHG (chlorhexidine gluconate) soap before surgery. CHG is an antiseptic soap that kills germs and continues to kill germs even after washing.   DO NOT use if you have an allergy to chlorhexidine/CHG or antibacterial soaps. If your skin becomes reddened or irritated, stop using the CHG and notify one of our RNs at [phone].   Please shower with the CHG soap starting 4 days before surgery using the following schedule:     Please keep in mind the following:  DO NOT shave, including legs and underarms, starting the day of your first shower.   You  may shave your face at any point before/day of surgery.  Place clean sheets on your bed the day you start using CHG soap. Use a clean washcloth (not used since being washed) for each shower. DO NOT sleep with pets once you start using the CHG.   CHG Shower Instructions:  Wash your face and private area with normal soap. If you choose to wash your hair, wash first with your normal shampoo.  After you use shampoo/soap, rinse your hair and body thoroughly to remove shampoo/soap residue.  Turn the water OFF and apply about 3 tablespoons (45 ml) of CHG soap  to a CLEAN washcloth.  Apply CHG soap ONLY FROM YOUR NECK DOWN TO YOUR TOES (washing for 3-5 minutes)  DO NOT use CHG soap on face, private areas, open wounds, or sores.  Pay special attention to the area where your surgery is being performed.  If you are having back surgery, having someone wash your back for you may be helpful. Wait 2 minutes after CHG soap is applied, then you may rinse off the CHG soap.  Pat dry with a clean towel  Put on clean clothes/pajamas   If you choose to wear lotion, please use ONLY the CHG-compatible lotions that are listed below.  Additional instructions for the day of surgery: DO NOT APPLY any lotions, deodorants, cologne, or perfumes.   Do not bring valuables to the hospital. [facility] is not responsible for any belongings/valuables. Do not wear nail polish, gel polish, artificial nails, or any other type of covering on natural nails (fingers and toes) Do not wear jewelry or makeup Put on clean/comfortable clothes.  Please brush your teeth.  Ask your nurse before applying any prescription medications to the skin.     CHG Compatible Lotions   Aveeno Moisturizing lotion  Cetaphil Moisturizing Cream  Cetaphil Moisturizing Lotion  Clairol Herbal Essence Moisturizing Lotion, Dry Skin  Clairol Herbal Essence Moisturizing Lotion, Extra Dry Skin  Clairol Herbal Essence Moisturizing Lotion, Normal Skin  Curel Age Defying Therapeutic Moisturizing Lotion with Alpha Hydroxy  Curel Extreme Care Body Lotion  Curel Soothing Hands Moisturizing Hand Lotion  Curel Therapeutic Moisturizing Cream, Fragrance-Free  Curel Therapeutic Moisturizing Lotion, Fragrance-Free  Curel Therapeutic Moisturizing Lotion, Original Formula  Eucerin Daily Replenishing Lotion  Eucerin Dry Skin Therapy Plus Alpha Hydroxy Crme  Eucerin Dry Skin Therapy Plus Alpha Hydroxy Lotion  Eucerin Original Crme  Eucerin Original Lotion  Eucerin Plus Crme Eucerin Plus Lotion  Eucerin  TriLipid Replenishing Lotion  Keri Anti-Bacterial Hand Lotion  Keri Deep Conditioning Original Lotion Dry Skin Formula Softly Scented  Keri Deep Conditioning Original Lotion, Fragrance Free Sensitive Skin Formula  Keri Lotion Fast Absorbing Fragrance Free Sensitive Skin Formula  Keri Lotion Fast Absorbing Softly Scented Dry Skin Formula  Keri Original Lotion  Keri Skin Renewal Lotion Keri Silky Smooth Lotion  Keri Silky Smooth Sensitive Skin Lotion  Nivea Body Creamy Conditioning Oil  Nivea Body Extra Enriched Lotion  Nivea Body Original Lotion  Nivea Body Sheer Moisturizing Lotion Nivea Crme  Nivea Skin Firming Lotion  NutraDerm 30 Skin Lotion  NutraDerm Skin Lotion  NutraDerm Therapeutic Skin Cream  NutraDerm Therapeutic Skin Lotion  ProShield Protective Hand Cream  Provon moisturizing lotion  Please read over the following fact sheets that you were given.

## 2023-04-29 NOTE — Progress Notes (Signed)
 PCP - Dr Lavetta Nielsen Cardiologist - none  Chest x-ray - 04/17/23 EKG - n/a Stress Test - n/a ECHO - n/a Cardiac Cath - n/a  ICD Pacemaker/Loop - n/a  Sleep Study -  n/a  Diabetes - n/a  Aspirin and Blood Thinner Instructions:  n/a  NPO   Anesthesia review: no  STOP now taking any Aspirin (unless otherwise instructed by your surgeon), Aleve, Naproxen, Ibuprofen, Motrin, Advil, Goody's, BC's, all herbal medications, fish oil, and all vitamins.   Coronavirus Screening Do you have any of the following symptoms:  Cough yes/no: No Fever (>100.3F)  yes/no: No Runny nose yes/no: No Sore throat yes/no: No Difficulty breathing/shortness of breath  yes/no: No  Have you traveled in the last 14 days and where? yes/no: No  Patient verbalized understanding of instructions that were given to them at the PAT appointment. Patient was also instructed that they will need to review over the PAT instructions again at home before surgery.

## 2023-04-30 ENCOUNTER — Encounter (HOSPITAL_COMMUNITY)
Admission: RE | Admit: 2023-04-30 | Discharge: 2023-04-30 | Disposition: A | Discharge status: Home / Self Care | Source: Ambulatory Visit | Attending: Neurosurgery | Admitting: Neurosurgery

## 2023-04-30 ENCOUNTER — Other Ambulatory Visit: Payer: Self-pay

## 2023-04-30 ENCOUNTER — Encounter (HOSPITAL_COMMUNITY): Payer: Self-pay

## 2023-04-30 DIAGNOSIS — Z01812 Encounter for preprocedural laboratory examination: Secondary | ICD-10-CM | POA: Diagnosis not present

## 2023-04-30 DIAGNOSIS — Z01818 Encounter for other preprocedural examination: Secondary | ICD-10-CM

## 2023-04-30 LAB — TYPE AND SCREEN
ABO/RH(D): A POS
Antibody Screen: NEGATIVE

## 2023-04-30 LAB — SURGICAL PCR SCREEN
MRSA, PCR: NEGATIVE
Staphylococcus aureus: NEGATIVE

## 2023-04-30 NOTE — Progress Notes (Signed)
 Pt called with new arrival time of 0530.

## 2023-05-01 ENCOUNTER — Ambulatory Visit (HOSPITAL_COMMUNITY)

## 2023-05-01 ENCOUNTER — Ambulatory Visit (HOSPITAL_COMMUNITY): Admitting: Anesthesiology

## 2023-05-01 ENCOUNTER — Encounter (HOSPITAL_COMMUNITY): Payer: Self-pay | Admitting: Neurosurgery

## 2023-05-01 ENCOUNTER — Other Ambulatory Visit: Payer: Self-pay

## 2023-05-01 ENCOUNTER — Ambulatory Visit (HOSPITAL_COMMUNITY)
Admission: RE | Admit: 2023-05-01 | Discharge: 2023-05-02 | Disposition: A | Discharge status: Home / Self Care | Attending: Neurosurgery | Admitting: Neurosurgery

## 2023-05-01 ENCOUNTER — Encounter (HOSPITAL_COMMUNITY)
Admission: RE | Disposition: A | Discharge status: Home / Self Care | Payer: Self-pay | Source: Home / Self Care | Attending: Neurosurgery

## 2023-05-01 DIAGNOSIS — M4802 Spinal stenosis, cervical region: Secondary | ICD-10-CM | POA: Diagnosis present

## 2023-05-01 DIAGNOSIS — M2578 Osteophyte, vertebrae: Secondary | ICD-10-CM | POA: Insufficient documentation

## 2023-05-01 DIAGNOSIS — M50121 Cervical disc disorder at C4-C5 level with radiculopathy: Secondary | ICD-10-CM | POA: Diagnosis not present

## 2023-05-01 DIAGNOSIS — M4722 Other spondylosis with radiculopathy, cervical region: Secondary | ICD-10-CM | POA: Insufficient documentation

## 2023-05-01 DIAGNOSIS — Z96643 Presence of artificial hip joint, bilateral: Secondary | ICD-10-CM | POA: Insufficient documentation

## 2023-05-01 DIAGNOSIS — E039 Hypothyroidism, unspecified: Secondary | ICD-10-CM | POA: Insufficient documentation

## 2023-05-01 DIAGNOSIS — Z90710 Acquired absence of both cervix and uterus: Secondary | ICD-10-CM | POA: Insufficient documentation

## 2023-05-01 DIAGNOSIS — M4712 Other spondylosis with myelopathy, cervical region: Secondary | ICD-10-CM | POA: Insufficient documentation

## 2023-05-01 DIAGNOSIS — M1990 Unspecified osteoarthritis, unspecified site: Secondary | ICD-10-CM | POA: Insufficient documentation

## 2023-05-01 DIAGNOSIS — R52 Pain, unspecified: Secondary | ICD-10-CM

## 2023-05-01 DIAGNOSIS — K449 Diaphragmatic hernia without obstruction or gangrene: Secondary | ICD-10-CM | POA: Diagnosis not present

## 2023-05-01 DIAGNOSIS — J45909 Unspecified asthma, uncomplicated: Secondary | ICD-10-CM | POA: Insufficient documentation

## 2023-05-01 DIAGNOSIS — Z87891 Personal history of nicotine dependence: Secondary | ICD-10-CM | POA: Diagnosis not present

## 2023-05-01 DIAGNOSIS — Z981 Arthrodesis status: Secondary | ICD-10-CM | POA: Diagnosis not present

## 2023-05-01 DIAGNOSIS — Z01818 Encounter for other preprocedural examination: Secondary | ICD-10-CM

## 2023-05-01 HISTORY — PX: ANTERIOR CERVICAL DECOMP/DISCECTOMY FUSION: SHX1161

## 2023-05-01 SURGERY — ANTERIOR CERVICAL DECOMPRESSION/DISCECTOMY FUSION 3 LEVELS
Anesthesia: General | Site: Spine Cervical

## 2023-05-01 MED ORDER — ONDANSETRON HCL 4 MG PO TABS: 4.0000 mg | ORAL_TABLET | Freq: Four times a day (QID) | ORAL | Status: DC | PRN

## 2023-05-01 MED ORDER — ONDANSETRON HCL 4 MG/2ML IJ SOLN
INTRAMUSCULAR | Status: AC
  Filled 2023-05-01: qty 4

## 2023-05-01 MED ORDER — SUCCINYLCHOLINE CHLORIDE 200 MG/10ML IV SOSY
PREFILLED_SYRINGE | INTRAVENOUS | Status: DC | PRN
  Administered 2023-05-01: 120 mg via INTRAVENOUS

## 2023-05-01 MED ORDER — PROPOFOL 10 MG/ML IV BOLUS
INTRAVENOUS | Status: AC
  Filled 2023-05-01: qty 20

## 2023-05-01 MED ORDER — OXYCODONE HCL 5 MG PO TABS: 5.0000 mg | ORAL_TABLET | ORAL | Status: DC | PRN

## 2023-05-01 MED ORDER — GABAPENTIN 300 MG PO CAPS
300.0000 mg | ORAL_CAPSULE | Freq: Three times a day (TID) | ORAL | Status: DC
  Administered 2023-05-01 – 2023-05-02 (×3): 300 mg via ORAL
  Filled 2023-05-01 (×3): qty 1

## 2023-05-01 MED ORDER — SODIUM CHLORIDE 0.9% FLUSH: 3.0000 mL | INTRAVENOUS | Status: DC | PRN

## 2023-05-01 MED ORDER — ACETAMINOPHEN 325 MG PO TABS: 325.0000 mg | ORAL_TABLET | Freq: Once | ORAL | Status: DC | PRN

## 2023-05-01 MED ORDER — SENNA 8.6 MG PO TABS
1.0000 | ORAL_TABLET | Freq: Two times a day (BID) | ORAL | Status: DC
  Administered 2023-05-01 – 2023-05-02 (×3): 8.6 mg via ORAL
  Filled 2023-05-01 (×3): qty 1

## 2023-05-01 MED ORDER — ALUM & MAG HYDROXIDE-SIMETH 200-200-20 MG/5ML PO SUSP: 30.0000 mL | Freq: Four times a day (QID) | ORAL | Status: DC | PRN

## 2023-05-01 MED ORDER — POTASSIUM CHLORIDE IN NACL 20-0.9 MEQ/L-% IV SOLN
INTRAVENOUS | Status: DC
  Filled 2023-05-01: qty 1000

## 2023-05-01 MED ORDER — EPHEDRINE SULFATE-NACL 50-0.9 MG/10ML-% IV SOSY: PREFILLED_SYRINGE | INTRAVENOUS | Status: DC | PRN

## 2023-05-01 MED ORDER — KETAMINE HCL 10 MG/ML IJ SOLN
INTRAMUSCULAR | Status: DC | PRN
  Administered 2023-05-01: 20 mg via INTRAVENOUS
  Administered 2023-05-01: 30 mg via INTRAVENOUS

## 2023-05-01 MED ORDER — CHLORHEXIDINE GLUCONATE CLOTH 2 % EX PADS: 6.0000 | MEDICATED_PAD | Freq: Once | CUTANEOUS | Status: DC

## 2023-05-01 MED ORDER — PROPOFOL 1000 MG/100ML IV EMUL
INTRAVENOUS | Status: AC
  Filled 2023-05-01: qty 100

## 2023-05-01 MED ORDER — 0.9 % SODIUM CHLORIDE (POUR BTL) OPTIME
TOPICAL | Status: DC | PRN
  Administered 2023-05-01: 1000 mL

## 2023-05-01 MED ORDER — DEXAMETHASONE SODIUM PHOSPHATE 10 MG/ML IJ SOLN
INTRAMUSCULAR | Status: DC | PRN
  Administered 2023-05-01: 10 mg via INTRAVENOUS

## 2023-05-01 MED ORDER — MIDAZOLAM HCL 2 MG/2ML IJ SOLN
INTRAMUSCULAR | Status: DC | PRN
  Administered 2023-05-01: 2 mg via INTRAVENOUS

## 2023-05-01 MED ORDER — SCOPOLAMINE 1 MG/3DAYS TD PT72
1.0000 | MEDICATED_PATCH | TRANSDERMAL | Status: DC
  Administered 2023-05-01: 1 via TRANSDERMAL

## 2023-05-01 MED ORDER — ACETAMINOPHEN 160 MG/5ML PO SOLN: 325.0000 mg | Freq: Once | ORAL | Status: DC | PRN

## 2023-05-01 MED ORDER — LIDOCAINE 2% (20 MG/ML) 5 ML SYRINGE
INTRAMUSCULAR | Status: AC
  Filled 2023-05-01: qty 5

## 2023-05-01 MED ORDER — ONDANSETRON HCL 4 MG/2ML IJ SOLN
4.0000 mg | Freq: Four times a day (QID) | INTRAMUSCULAR | Status: DC | PRN
  Administered 2023-05-02: 4 mg via INTRAVENOUS
  Filled 2023-05-01: qty 2

## 2023-05-01 MED ORDER — PROPOFOL 500 MG/50ML IV EMUL
INTRAVENOUS | Status: DC | PRN
  Administered 2023-05-01: 150 ug/kg/min via INTRAVENOUS

## 2023-05-01 MED ORDER — THROMBIN 20000 UNITS EX SOLR
CUTANEOUS | Status: AC
  Filled 2023-05-01: qty 20000

## 2023-05-01 MED ORDER — ACETAMINOPHEN 500 MG PO TABS
1000.0000 mg | ORAL_TABLET | Freq: Four times a day (QID) | ORAL | Status: DC
  Administered 2023-05-01 – 2023-05-02 (×3): 1000 mg via ORAL
  Filled 2023-05-01 (×4): qty 2

## 2023-05-01 MED ORDER — ORAL CARE MOUTH RINSE: 15.0000 mL | Freq: Once | OROMUCOSAL | Status: AC

## 2023-05-01 MED ORDER — DEXMEDETOMIDINE HCL IN NACL 80 MCG/20ML IV SOLN
INTRAVENOUS | Status: AC
  Filled 2023-05-01: qty 20

## 2023-05-01 MED ORDER — DIAZEPAM 5 MG PO TABS
5.0000 mg | ORAL_TABLET | Freq: Four times a day (QID) | ORAL | Status: DC | PRN
  Administered 2023-05-01 – 2023-05-02 (×3): 5 mg via ORAL
  Filled 2023-05-01 (×3): qty 1

## 2023-05-01 MED ORDER — PHENYLEPHRINE 80 MCG/ML (10ML) SYRINGE FOR IV PUSH (FOR BLOOD PRESSURE SUPPORT)
PREFILLED_SYRINGE | INTRAVENOUS | Status: AC
  Filled 2023-05-01: qty 20

## 2023-05-01 MED ORDER — LACTATED RINGERS IV SOLN: INTRAVENOUS | Status: DC | PRN

## 2023-05-01 MED ORDER — FENTANYL CITRATE (PF) 250 MCG/5ML IJ SOLN
INTRAMUSCULAR | Status: DC | PRN
  Administered 2023-05-01: 150 ug via INTRAVENOUS
  Administered 2023-05-01: 100 ug via INTRAVENOUS

## 2023-05-01 MED ORDER — PROPOFOL 10 MG/ML IV BOLUS
INTRAVENOUS | Status: DC | PRN
  Administered 2023-05-01: 200 mg via INTRAVENOUS

## 2023-05-01 MED ORDER — ACETAMINOPHEN 650 MG RE SUPP: 650.0000 mg | RECTAL | Status: DC | PRN

## 2023-05-01 MED ORDER — DEXAMETHASONE SODIUM PHOSPHATE 4 MG/ML IJ SOLN: 4.0000 mg | Freq: Four times a day (QID) | INTRAMUSCULAR | Status: DC

## 2023-05-01 MED ORDER — DEXMEDETOMIDINE HCL IN NACL 80 MCG/20ML IV SOLN
INTRAVENOUS | Status: DC | PRN
  Administered 2023-05-01: 8 ug via INTRAVENOUS

## 2023-05-01 MED ORDER — CHLORHEXIDINE GLUCONATE 0.12 % MT SOLN
15.0000 mL | Freq: Once | OROMUCOSAL | Status: AC
  Administered 2023-05-01: 15 mL via OROMUCOSAL
  Filled 2023-05-01: qty 15

## 2023-05-01 MED ORDER — PHENYLEPHRINE 80 MCG/ML (10ML) SYRINGE FOR IV PUSH (FOR BLOOD PRESSURE SUPPORT)
PREFILLED_SYRINGE | INTRAVENOUS | Status: AC
  Filled 2023-05-01: qty 10

## 2023-05-01 MED ORDER — ACETAMINOPHEN 10 MG/ML IV SOLN: 1000.0000 mg | Freq: Once | INTRAVENOUS | Status: DC | PRN

## 2023-05-01 MED ORDER — ACETAMINOPHEN 500 MG PO TABS: 1000.0000 mg | ORAL_TABLET | Freq: Once | ORAL | Status: DC

## 2023-05-01 MED ORDER — ALBUTEROL SULFATE HFA 108 (90 BASE) MCG/ACT IN AERS: 2.0000 | INHALATION_SPRAY | Freq: Four times a day (QID) | RESPIRATORY_TRACT | Status: DC | PRN

## 2023-05-01 MED ORDER — MEPERIDINE HCL 25 MG/ML IJ SOLN: 6.2500 mg | INTRAMUSCULAR | Status: DC | PRN

## 2023-05-01 MED ORDER — OXYCODONE HCL ER 10 MG PO T12A
10.0000 mg | EXTENDED_RELEASE_TABLET | Freq: Two times a day (BID) | ORAL | Status: DC
  Administered 2023-05-01 – 2023-05-02 (×3): 10 mg via ORAL
  Filled 2023-05-01 (×3): qty 1

## 2023-05-01 MED ORDER — ROCURONIUM BROMIDE 10 MG/ML (PF) SYRINGE
PREFILLED_SYRINGE | INTRAVENOUS | Status: DC | PRN
  Administered 2023-05-01: 20 mg via INTRAVENOUS
  Administered 2023-05-01: 100 mg via INTRAVENOUS
  Administered 2023-05-01 (×2): 10 mg via INTRAVENOUS

## 2023-05-01 MED ORDER — ALBUMIN HUMAN 5 % IV SOLN: INTRAVENOUS | Status: DC | PRN

## 2023-05-01 MED ORDER — SODIUM CHLORIDE 0.9% FLUSH
3.0000 mL | Freq: Two times a day (BID) | INTRAVENOUS | Status: DC
  Administered 2023-05-01: 3 mL via INTRAVENOUS

## 2023-05-01 MED ORDER — SODIUM CHLORIDE 0.9 % IV SOLN: 250.0000 mL | INTRAVENOUS | Status: DC

## 2023-05-01 MED ORDER — DEXAMETHASONE SODIUM PHOSPHATE 10 MG/ML IJ SOLN
INTRAMUSCULAR | Status: AC
  Filled 2023-05-01: qty 2

## 2023-05-01 MED ORDER — MENTHOL 3 MG MT LOZG
1.0000 | LOZENGE | OROMUCOSAL | Status: DC | PRN
  Administered 2023-05-02: 3 mg via ORAL
  Filled 2023-05-01 (×2): qty 9

## 2023-05-01 MED ORDER — EPHEDRINE 5 MG/ML INJ
INTRAVENOUS | Status: AC
  Filled 2023-05-01: qty 5

## 2023-05-01 MED ORDER — KETAMINE HCL 50 MG/5ML IJ SOSY
PREFILLED_SYRINGE | INTRAMUSCULAR | Status: AC
  Filled 2023-05-01: qty 5

## 2023-05-01 MED ORDER — HYDROMORPHONE HCL 1 MG/ML IJ SOLN
INTRAMUSCULAR | Status: DC | PRN
  Administered 2023-05-01: .5 mg via INTRAVENOUS

## 2023-05-01 MED ORDER — OXYCODONE HCL 5 MG PO TABS
10.0000 mg | ORAL_TABLET | ORAL | Status: DC | PRN
  Administered 2023-05-01 – 2023-05-02 (×3): 10 mg via ORAL
  Filled 2023-05-01 (×3): qty 2

## 2023-05-01 MED ORDER — SODIUM CHLORIDE 0.9 % IV SOLN
0.1500 ug/kg/min | Freq: Once | INTRAVENOUS | Status: AC
  Administered 2023-05-01: .1 ug/kg/min via INTRAVENOUS
  Filled 2023-05-01: qty 2000

## 2023-05-01 MED ORDER — LIDOCAINE 2% (20 MG/ML) 5 ML SYRINGE
INTRAMUSCULAR | Status: DC | PRN
Start: 2023-05-01 — End: 2023-05-01
  Administered 2023-05-01: 60 mg via INTRAVENOUS
  Administered 2023-05-01: 40 mg via INTRAVENOUS

## 2023-05-01 MED ORDER — ACETAMINOPHEN 325 MG PO TABS: 650.0000 mg | ORAL_TABLET | ORAL | Status: DC | PRN

## 2023-05-01 MED ORDER — LIDOCAINE-EPINEPHRINE 0.5 %-1:200000 IJ SOLN
INTRAMUSCULAR | Status: DC | PRN
  Administered 2023-05-01: 4 mL

## 2023-05-01 MED ORDER — ONDANSETRON HCL 4 MG/2ML IJ SOLN
INTRAMUSCULAR | Status: DC | PRN
  Administered 2023-05-01: 4 mg via INTRAVENOUS

## 2023-05-01 MED ORDER — PHENYLEPHRINE 80 MCG/ML (10ML) SYRINGE FOR IV PUSH (FOR BLOOD PRESSURE SUPPORT)
PREFILLED_SYRINGE | INTRAVENOUS | Status: AC
Start: 2023-05-01 — End: ?
  Filled 2023-05-01: qty 20

## 2023-05-01 MED ORDER — DROPERIDOL 2.5 MG/ML IJ SOLN: 0.6250 mg | Freq: Once | INTRAMUSCULAR | Status: DC | PRN

## 2023-05-01 MED ORDER — ZOLPIDEM TARTRATE 5 MG PO TABS: 5.0000 mg | ORAL_TABLET | Freq: Every evening | ORAL | Status: DC | PRN

## 2023-05-01 MED ORDER — FENTANYL CITRATE (PF) 250 MCG/5ML IJ SOLN
INTRAMUSCULAR | Status: AC
  Filled 2023-05-01: qty 5

## 2023-05-01 MED ORDER — PHENOL 1.4 % MT LIQD
1.0000 | OROMUCOSAL | Status: DC | PRN
  Filled 2023-05-01: qty 177

## 2023-05-01 MED ORDER — SUCCINYLCHOLINE CHLORIDE 200 MG/10ML IV SOSY
PREFILLED_SYRINGE | INTRAVENOUS | Status: AC
  Filled 2023-05-01: qty 10

## 2023-05-01 MED ORDER — COLCHICINE 0.6 MG PO TABS
0.6000 mg | ORAL_TABLET | Freq: Every day | ORAL | Status: DC
  Administered 2023-05-02: 0.6 mg via ORAL
  Filled 2023-05-01: qty 1

## 2023-05-01 MED ORDER — ROCURONIUM BROMIDE 10 MG/ML (PF) SYRINGE
PREFILLED_SYRINGE | INTRAVENOUS | Status: AC
  Filled 2023-05-01: qty 20

## 2023-05-01 MED ORDER — MIDAZOLAM HCL 2 MG/2ML IJ SOLN
INTRAMUSCULAR | Status: AC
  Filled 2023-05-01: qty 2

## 2023-05-01 MED ORDER — LIDOCAINE-EPINEPHRINE 0.5 %-1:200000 IJ SOLN
INTRAMUSCULAR | Status: AC
  Filled 2023-05-01: qty 50

## 2023-05-01 MED ORDER — PHENYLEPHRINE 80 MCG/ML (10ML) SYRINGE FOR IV PUSH (FOR BLOOD PRESSURE SUPPORT)
PREFILLED_SYRINGE | INTRAVENOUS | Status: DC | PRN
  Administered 2023-05-01 (×2): 80 ug via INTRAVENOUS
  Administered 2023-05-01: 160 ug via INTRAVENOUS
  Administered 2023-05-01 (×3): 80 ug via INTRAVENOUS
  Administered 2023-05-01: 160 ug via INTRAVENOUS
  Administered 2023-05-01 (×9): 80 ug via INTRAVENOUS
  Administered 2023-05-01: 40 ug via INTRAVENOUS

## 2023-05-01 MED ORDER — SUGAMMADEX SODIUM 200 MG/2ML IV SOLN
INTRAVENOUS | Status: DC | PRN
  Administered 2023-05-01 (×2): 200 mg via INTRAVENOUS

## 2023-05-01 MED ORDER — HYDROMORPHONE HCL 1 MG/ML IJ SOLN
0.2500 mg | INTRAMUSCULAR | Status: DC | PRN
  Administered 2023-05-01 (×3): 0.5 mg via INTRAVENOUS

## 2023-05-01 MED ORDER — VANCOMYCIN HCL IN DEXTROSE 1-5 GM/200ML-% IV SOLN
1000.0000 mg | INTRAVENOUS | Status: AC
  Administered 2023-05-01: 1000 mg via INTRAVENOUS
  Filled 2023-05-01: qty 200

## 2023-05-01 MED ORDER — DEXAMETHASONE 4 MG PO TABS
4.0000 mg | ORAL_TABLET | Freq: Four times a day (QID) | ORAL | Status: DC
  Administered 2023-05-01 – 2023-05-02 (×4): 4 mg via ORAL
  Filled 2023-05-01 (×4): qty 1

## 2023-05-01 MED ORDER — HYDROMORPHONE HCL 1 MG/ML IJ SOLN
INTRAMUSCULAR | Status: AC
Start: 2023-05-01 — End: 2023-05-02
  Filled 2023-05-01: qty 1

## 2023-05-01 MED ORDER — LACTATED RINGERS IV SOLN: INTRAVENOUS | Status: DC

## 2023-05-01 MED ORDER — SODIUM CHLORIDE 0.9 % IV SOLN: INTRAVENOUS | Status: DC | PRN

## 2023-05-01 MED ORDER — THROMBIN 20000 UNITS EX SOLR: CUTANEOUS | Status: DC | PRN

## 2023-05-01 MED ORDER — HYDROMORPHONE HCL 1 MG/ML IJ SOLN
INTRAMUSCULAR | Status: AC
  Filled 2023-05-01: qty 0.5

## 2023-05-01 MED ORDER — HYDROMORPHONE HCL 1 MG/ML IJ SOLN
INTRAMUSCULAR | Status: AC
  Filled 2023-05-01: qty 1

## 2023-05-01 SURGICAL SUPPLY — 45 items
BAG COUNTER SPONGE SURGICOUNT (BAG) ×2 IMPLANT
BAND RUBBER #18 3X1/16 STRL (MISCELLANEOUS) ×4 IMPLANT
BONE SPACER C-PLUG 14X6X12 (Bone Implant) IMPLANT
BONE SPACER C-PLUG 14X7X12 (Bone Implant) IMPLANT
BONE SPACER C-PLUG 14X8X12 (Bone Implant) IMPLANT
BUR DRUM 4.0 (BURR) ×2 IMPLANT
BUR MATCHSTICK NEURO 3.0 LAGG (BURR) ×2 IMPLANT
CANISTER SUCT 3000ML PPV (MISCELLANEOUS) ×2 IMPLANT
DERMABOND ADVANCED .7 DNX12 (GAUZE/BANDAGES/DRESSINGS) ×2 IMPLANT
DRAPE HALF SHEET 40X57 (DRAPES) IMPLANT
DRAPE LAPAROTOMY 100X72 PEDS (DRAPES) ×2 IMPLANT
DRAPE MICROSCOPE SLANT 54X150 (MISCELLANEOUS) ×2 IMPLANT
DURAPREP 6ML APPLICATOR 50/CS (WOUND CARE) ×2 IMPLANT
ELECT COATED BLADE 2.86 ST (ELECTRODE) ×2 IMPLANT
ELECT REM PT RETURN 9FT ADLT (ELECTROSURGICAL) ×1 IMPLANT
ELECTRODE REM PT RTRN 9FT ADLT (ELECTROSURGICAL) ×2 IMPLANT
GAUZE 4X4 16PLY ~~LOC~~+RFID DBL (SPONGE) IMPLANT
GLOVE ECLIPSE 6.5 STRL STRAW (GLOVE) ×2 IMPLANT
GLOVE EXAM NITRILE XL STR (GLOVE) IMPLANT
GOWN STRL REUS W/ TWL LRG LVL3 (GOWN DISPOSABLE) ×4 IMPLANT
GOWN STRL REUS W/ TWL XL LVL3 (GOWN DISPOSABLE) IMPLANT
GOWN STRL REUS W/TWL 2XL LVL3 (GOWN DISPOSABLE) IMPLANT
HALTER HD/CHIN CERV TRACTION D (MISCELLANEOUS) IMPLANT
HEMOSTAT SURGICEL 2X14 (HEMOSTASIS) IMPLANT
KIT BASIN OR (CUSTOM PROCEDURE TRAY) ×2 IMPLANT
KIT TURNOVER KIT B (KITS) ×2 IMPLANT
NDL HYPO 25X1 1.5 SAFETY (NEEDLE) ×2 IMPLANT
NDL SPNL 22GX3.5 QUINCKE BK (NEEDLE) ×2 IMPLANT
NEEDLE HYPO 25X1 1.5 SAFETY (NEEDLE) ×1 IMPLANT
NEEDLE SPNL 22GX3.5 QUINCKE BK (NEEDLE) ×1 IMPLANT
NS IRRIG 1000ML POUR BTL (IV SOLUTION) ×2 IMPLANT
PACK LAMINECTOMY NEURO (CUSTOM PROCEDURE TRAY) ×2 IMPLANT
PAD ARMBOARD POSITIONER FOAM (MISCELLANEOUS) ×2 IMPLANT
PIN DISTRACTION 14MM (PIN) IMPLANT
PLATE HELIX T 56 (Plate) IMPLANT
SCREW SELF DRILL VARIABLE 13MM (Screw) IMPLANT
SCREW SELF TAP 13MM VARIABLE (Screw) IMPLANT
SPIKE FLUID TRANSFER (MISCELLANEOUS) ×2 IMPLANT
SPONGE INTESTINAL PEANUT (DISPOSABLE) ×2 IMPLANT
SPONGE SURGIFOAM ABS GEL 100 (HEMOSTASIS) IMPLANT
SUT VIC AB 0 CT1 27XBRD ANTBC (SUTURE) IMPLANT
SUT VIC AB 3-0 SH 8-18 (SUTURE) ×2 IMPLANT
TOWEL GREEN STERILE (TOWEL DISPOSABLE) ×2 IMPLANT
TOWEL GREEN STERILE FF (TOWEL DISPOSABLE) ×2 IMPLANT
WATER STERILE IRR 1000ML POUR (IV SOLUTION) ×2 IMPLANT

## 2023-05-01 NOTE — Anesthesia Postprocedure Evaluation (Addendum)
 Anesthesia Post Note  Patient: Paula Cervantes  Procedure(s) Performed: ANTERIOR CERVICAL DECOMPRESSION/DISCECTOMY FUSION CERVICAL THREE-FOUR, CERVICAL FOUR-FIVE, CERVICAL FIVE-SIX (Spine Cervical)     Patient location during evaluation: PACU Anesthesia Type: General Level of consciousness: awake and alert Pain management: pain level controlled Vital Signs Assessment: post-procedure vital signs reviewed and stable Respiratory status: spontaneous breathing, nonlabored ventilation, respiratory function stable and patient connected to nasal cannula oxygen Cardiovascular status: blood pressure returned to baseline and stable Postop Assessment: no apparent nausea or vomiting Anesthetic complications: yes  Encounter Notable Events  Notable Event Outcome Phase Comment  Difficult to intubate - expected  Intraprocedure Filed from anesthesia note documentation.    Last Vitals:  Vitals:   05/01/23 1315 05/01/23 1345  BP: (!) 158/92 (!) 163/91  Pulse: 88 94  Resp: 14 20  Temp:  36.6 C  SpO2: 97% 98%                Shelton Silvas

## 2023-05-01 NOTE — H&P (Signed)
 BP 117/74   Pulse 84   Temp 97.8 F (36.6 C) (Oral)   Resp 18   Ht 5\' 7"  (1.702 m)   Wt 89.4 kg   LMP 01/07/2017   SpO2 97%   BMI 30.85 kg/m   Paula Cervantes comes in and is miserable.  She is leaning over.  She is holding her left upper extremity.  She is bracing her shoulder.  She cannot sit still secondary to the pain that she is experiencing in her left upper extremity.  She weighs 198 pounds.  Temperature is 98.2, blood pressure 111/76, pulse is 96.  Pain is 10/10.  She has had this severe pain for the last 2 weeks.  She has been seen by a chiropractor, she has gone to the emergency department.  The last day she was able to work was on 04/14/2023.  She has had 5 injections in the last 2 weeks after being seen by an orthopedic surgeon.  On her registration sheet, she states that pain is 10/10.  The extremity pain as bad as the neck pain.  She can do very little, sit for 2 minutes, stand for 2 minutes and walk for 5 minutes without pain.  Pain is described as aching, burning, deep heavy pressure, sharp and shooting.  Came on of its own.  She does report weakness in the left upper extremity, numbness and paresthesias.  She is not pregnant.  She has osteoarthritis.  She has had both hips replaced and Paula Cervantes is only 48 years of age.  She has a history of hypothyroidism.  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  She has undergone a hysterectomy, and she has had a cesarean section.  She lives with her spouse and children.  She used cigarettes for 15 years.  Stopped age 32.  She does use alcohol.  She has been taking Advil, hydrocodone, and gabapentin.  She has no known IV contrast allergies.  She has had an MRI.  She has had the chiropractic treatment and therapeutic massage without improvement.  EXAMINATION: She is alert and oriented x4 and answers all questions appropriately.  Memory, language, attention span, and fund of knowledge are normal.  Speech is clear and  fluent.  She is in obvious distress.  She has weakness in her biceps.  She has weakness in her triceps.  Very difficult to distinguish whether it is only pain-mediated or if she does have muscular weakness, but objectively she is weak.  She has normal strength in the right upper extremity.  She has normal muscle tone and bulk.  Reflexes 2+ at the biceps, triceps, brachioradialis, knees, and ankles.  Romberg is negative.  Gait is normal.  Pupils equal, round, and reactive to light.  Full extraocular movements.  Full visual fields.  Hearing intact to voice.  Uvula elevates in midline.  Shoulder shrug is normal.  Tongue protrudes in the midline.     The MRI high, which is from April 23, 2023, shows a kyphotic deformity centered at the 5-6 disc space.  She has some calcification of the posterior longitudinal ligament.  At 3-4, she is stenotic.  At 4-5, she has a large osteophyte eccentric to the left side with severe foraminal narrowing, same thing at 5-6 centric to the left.  At 6-7, she has small bilateral discs but she has space on the left.  So, at 3-4, 4-5, and 5-6, I think is where the problems with the left  upper extremity pain come from.  There appears to be a hint of signal in the cord behind the C4 vertebral bodies starting at the C3-4 disc space.   Paula Cervantes is both weak in the upper extremities, she has what appears to be cord signal in the spinal cord at C3-4, and she is markedly stenotic at certain levels and has large osteophytes which account for the severe left upper extremity pain.  I cannot truly speculate on why this started acutely because these do look like bone spurs.  But, without any question, they started acutely.  She is a candidate and should undergo, given the weakness, an ACDF at C3-4, 4-5 and 5-6.  We will try to get this on the schedule this week.  She was given a detailed sheet describing the operation, and what she could expect.  I will see her I hope on Friday for her case to be  done.

## 2023-05-01 NOTE — Anesthesia Preprocedure Evaluation (Addendum)
 Anesthesia Evaluation  Patient identified by MRN, date of birth, ID band Patient awake    Reviewed: Allergy & Precautions, NPO status , Patient's Chart, lab work & pertinent test results  Airway Mallampati: I  TM Distance: >3 FB Neck ROM: Full    Dental  (+) Teeth Intact, Dental Advisory Given   Pulmonary asthma , former smoker   breath sounds clear to auscultation       Cardiovascular negative cardio ROS  Rhythm:Regular Rate:Normal     Neuro/Psych  Headaches  negative psych ROS   GI/Hepatic Neg liver ROS, hiatal hernia,,,  Endo/Other  Hypothyroidism    Renal/GU negative Renal ROS     Musculoskeletal  (+) Arthritis ,    Abdominal   Peds  Hematology  (+) Blood dyscrasia, anemia   Anesthesia Other Findings   Reproductive/Obstetrics                             Anesthesia Physical Anesthesia Plan  ASA: 2  Anesthesia Plan: General   Post-op Pain Management: Tylenol PO (pre-op)*   Induction: Intravenous  PONV Risk Score and Plan: 4 or greater and Ondansetron, Dexamethasone, Midazolam and Scopolamine patch - Pre-op  Airway Management Planned: Oral ETT and Video Laryngoscope Planned  Additional Equipment: None  Intra-op Plan:   Post-operative Plan: Extubation in OR  Informed Consent: I have reviewed the patients History and Physical, chart, labs and discussed the procedure including the risks, benefits and alternatives for the proposed anesthesia with the patient or authorized representative who has indicated his/her understanding and acceptance.     Dental advisory given  Plan Discussed with: CRNA  Anesthesia Plan Comments:        Anesthesia Quick Evaluation

## 2023-05-01 NOTE — Anesthesia Procedure Notes (Signed)
 Procedure Name: Intubation Date/Time: 05/01/2023 7:53 AM  Performed by: Jimmey Ralph, CRNAPre-anesthesia Checklist: Patient identified, Emergency Drugs available, Suction available and Patient being monitored Patient Re-evaluated:Patient Re-evaluated prior to induction Oxygen Delivery Method: Circle system utilized Preoxygenation: Pre-oxygenation with 100% oxygen Induction Type: IV induction Ventilation: Mask ventilation without difficulty Laryngoscope Size: Glidescope and 3 Grade View: Grade I Tube type: Oral Tube size: 7.0 mm Number of attempts: 1 Airway Equipment and Method: Stylet and Oral airway Placement Confirmation: ETT inserted through vocal cords under direct vision, positive ETCO2 and breath sounds checked- equal and bilateral Secured at: 23 cm Tube secured with: Tape Dental Injury: Teeth and Oropharynx as per pre-operative assessment  Difficulty Due To: Difficulty was anticipated Comments: Pt has symptomatic cervical dxs ; she also had a small mouth opening c sux

## 2023-05-01 NOTE — Op Note (Signed)
 05/01/2023  9:54 PM  PATIENT:  Paula Cervantes  48 y.o. female With severe pain in her left upper extremity. She on MRI has severe stenosis and foraminal narrowing on the left at C3/4,4/5,and 5/6. She is admitted for an ACDF at 3/4,4/5,and 5/6/ PRE-OPERATIVE DIAGNOSIS:  Cervical spondylosis with radiculopathy Cervical stenosis POST-OPERATIVE DIAGNOSIS:  Cervical spondylosis with radiculopathy Cervical stenosis PROCEDURE:  Anterior Cervical decompression C3/4,4/5,and 5/6 Arthrodesis C3-6 with 7mm, 8mm, and 7mm  structural allografts at 4/5,5/6, and 3/4 Anterior instrumentation(Nuvasive) C3-6  SURGEON:   Surgeon(s): Coletta Memos, MD   ASSISTANTS:none  ANESTHESIA:   general  EBL:  No intake/output data recorded.  BLOOD ADMINISTERED:none  CELL SAVER GIVEN:none  COUNT:per nursing  DRAINS: none   SPECIMEN:  No Specimen  DICTATION: Mrs. Roland was taken to the operating room, intubated, and placed under general anesthesia without difficulty. She was positioned supine with her head in slight extension on a horseshoe headrest. The neck was prepped and draped in a sterile manner. I infiltrated 5 cc's 1/2%lidocaine/1:200,000 strength epinephrine into the planned incision starting from the midline to the medial border of the left sternocleidomastoid muscle. I opened the incision with a 10 blade and dissected sharply through soft tissue to the platysma. I dissected in the plane superior to the platysma both rostrally and caudally. I then opened the platysma in a horizontal fashion with Metzenbaum scissors, and dissected in the inferior plane rostrally and caudally. With both blunt and sharp technique I created an avascular corridor to the cervical spine. I placed a spinal needle(s) in the disc space at C5/6 . I then reflected the longus colli from C3 to C6 and placed self retaining retractors. I opened the disc space(s) at 3/4,4/5, and 5/6 with a 15 blade. I removed disc with curettes, Kerrison  punches, and the drill. Using the drill I removed osteophytes and prepared for the decompression.  I decompressed the spinal canal and the C4,5,and 6 root(s) with the drill, Kerrison punches, and the curettes. I used the microscope to aid in microdissection. I removed the posterior longitudinal ligament to fully expose and decompress the thecal sac. I exposed the roots laterally taking down the 3/4,4/5, and 5/6 uncovertebral joints. With the decompression complete I moved on to the arthrodesis. I used the drill to level the surfaces of C3,4,5,and 6. I removed soft tissue to prepare the disc space and the bony surfaces. I measured the space and placed  structural allografts 7mm, 8mm, and 7mm at 3/4,4/5, and 5/6 into the disc spaces.  I then placed the anterior instrumentation. I placed 2 screws in each vertebral body through the plate. I locked the screws into place. Intraoperative xray showed the graft, plate, and screws to be in good position. I irrigated the wound, achieved hemostasis, and closed the wound in layers. I approximated the platysma, and the subcuticular plane with vicryl sutures. I used Dermabond for a sterile dressing.   PLAN OF CARE: Admit for overnight observation  PATIENT DISPOSITION:  PACU - hemodynamically stable.   Delay start of Pharmacological VTE agent (>24hrs) due to surgical blood loss or risk of bleeding:  yes

## 2023-05-01 NOTE — Transfer of Care (Signed)
 Immediate Anesthesia Transfer of Care Note  Patient: Paula Cervantes  Procedure(s) Performed: ANTERIOR CERVICAL DECOMPRESSION/DISCECTOMY FUSION CERVICAL THREE-FOUR, CERVICAL FOUR-FIVE, CERVICAL FIVE-SIX (Spine Cervical)  Patient Location: PACU  Anesthesia Type:General  Level of Consciousness: awake, drowsy, and patient cooperative  Airway & Oxygen Therapy: Patient Spontanous Breathing and Patient connected to face mask oxygen  Post-op Assessment: Report given to RN and Post -op Vital signs reviewed and stable  Post vital signs: Reviewed and stable  Last Vitals:  Vitals Value Taken Time  BP 153/96 05/01/23 1204  Temp 36.6 C 05/01/23 1203  Pulse 92 05/01/23 1205  Resp 22 05/01/23 1206  SpO2 99 % 05/01/23 1205  Vitals shown include unfiled device data.  Last Pain:  Vitals:   05/01/23 0543  TempSrc: Oral  PainSc: 10-Worst pain ever         Complications:  Encounter Notable Events  Notable Event Outcome Phase Comment  Difficult to intubate - expected  Intraprocedure Filed from anesthesia note documentation.

## 2023-05-02 DIAGNOSIS — Z96643 Presence of artificial hip joint, bilateral: Secondary | ICD-10-CM | POA: Diagnosis not present

## 2023-05-02 DIAGNOSIS — M4722 Other spondylosis with radiculopathy, cervical region: Secondary | ICD-10-CM | POA: Diagnosis not present

## 2023-05-02 DIAGNOSIS — Z87891 Personal history of nicotine dependence: Secondary | ICD-10-CM | POA: Diagnosis not present

## 2023-05-02 DIAGNOSIS — J45909 Unspecified asthma, uncomplicated: Secondary | ICD-10-CM | POA: Diagnosis not present

## 2023-05-02 DIAGNOSIS — K449 Diaphragmatic hernia without obstruction or gangrene: Secondary | ICD-10-CM | POA: Diagnosis not present

## 2023-05-02 DIAGNOSIS — M2578 Osteophyte, vertebrae: Secondary | ICD-10-CM | POA: Diagnosis not present

## 2023-05-02 DIAGNOSIS — Z90710 Acquired absence of both cervix and uterus: Secondary | ICD-10-CM | POA: Diagnosis not present

## 2023-05-02 DIAGNOSIS — M4802 Spinal stenosis, cervical region: Secondary | ICD-10-CM | POA: Diagnosis not present

## 2023-05-02 DIAGNOSIS — M1990 Unspecified osteoarthritis, unspecified site: Secondary | ICD-10-CM | POA: Diagnosis not present

## 2023-05-02 DIAGNOSIS — E039 Hypothyroidism, unspecified: Secondary | ICD-10-CM | POA: Diagnosis not present

## 2023-05-02 DIAGNOSIS — M4712 Other spondylosis with myelopathy, cervical region: Secondary | ICD-10-CM | POA: Diagnosis not present

## 2023-05-02 DIAGNOSIS — M50121 Cervical disc disorder at C4-C5 level with radiculopathy: Secondary | ICD-10-CM | POA: Diagnosis not present

## 2023-05-02 MED ORDER — HYDROCODONE-ACETAMINOPHEN 5-325 MG PO TABS: 1.0000 | ORAL_TABLET | ORAL | 0 refills | Status: DC | PRN

## 2023-05-02 MED ORDER — CYCLOBENZAPRINE HCL 5 MG PO TABS: 5.0000 mg | ORAL_TABLET | Freq: Three times a day (TID) | ORAL | 0 refills | Status: AC | PRN

## 2023-05-02 NOTE — Progress Notes (Signed)
 PT Cancellation Note  Patient Details Name: Paula Cervantes MRN: [id number] DOB: 02/26/1975   Cancelled Treatment:    Reason Eval/Treat Not Completed: PT screened, no needs identified, will sign off   Paula Cervantes 05/02/2023, 8:46 AM Jaidynn Balster M,PT Acute Rehab Services [phone]

## 2023-05-02 NOTE — Discharge Summary (Signed)
 Physician Discharge Summary  Patient ID: Paula Cervantes MRN: [id number] DOB/AGE: 47-Oct-1977 47 y.o.  Admit date: 05/01/2023 Discharge date: 05/02/2023  Admission Diagnoses: Cervical spondylosis with radiculopathy Cervical stenosis    Discharge Diagnoses: same   Discharged Condition: good  Hospital Course: The patient was admitted on 05/01/2023 and taken to the operating room where the patient underwent acdf c3-4, C4-5, C5-6. The patient tolerated the procedure well and was taken to the recovery room and then to the floor in stable condition. The hospital course was routine. There were no complications. The wound remained clean dry and intact. Pt had appropriate neck soreness. No complaints of arm pain or new N/T/W. The patient remained afebrile with stable vital signs, and tolerated a regular diet. The patient continued to increase activities, and pain was well controlled with oral pain medications.   Consults: None  Significant Diagnostic Studies:  Results for orders placed or performed during the hospital encounter of 04/30/23  Surgical pcr screen   Collection Time: 04/30/23  8:43 AM   Specimen: Nasal Mucosa; Nasal Swab  Result Value Ref Range   MRSA, PCR NEGATIVE NEGATIVE   Staphylococcus aureus NEGATIVE NEGATIVE  Type and screen   Collection Time: 04/30/23  9:05 AM  Result Value Ref Range   ABO/RH(D) A POS    Antibody Screen NEG    Sample Expiration 05/14/2023,2359    Extend sample reason      NO TRANSFUSIONS OR PREGNANCY IN THE PAST 3 MONTHS Performed at [facility], [address] [place], [place] [address]     DG Cervical Spine 2 or 3 views Result Date: 05/01/2023 CLINICAL DATA:  Elective surgery. EXAM: CERVICAL SPINE - 2-3 VIEW COMPARISON:  None Available. FINDINGS: Four cross-table lateral spot views from the operating room submitted. Image 1 demonstrates surgical instrument localizing anteriorly at the C4-C5 level. Image 2 demonstrates surgical instrument  localizing over the C5-C6 disc space. Image 3 demonstrates surgical instruments at the C4-C5 level. Image 4 demonstrates anterior fusion hardware C3 through C6 with interbody spacers. IMPRESSION: Intraoperative spot views during cervical fusion. Electronically Signed   By: Narda Rutherford M.D.   On: 05/01/2023 15:04   MR Cervical Spine Wo Contrast Result Date: 04/23/2023 CLINICAL DATA:  Cervical myelopathy, left-sided neck and shoulder pain. EXAM: MRI CERVICAL SPINE WITHOUT CONTRAST TECHNIQUE: Multiplanar, multisequence MR imaging of the cervical spine was performed. No intravenous contrast was administered. COMPARISON:  No prior images available for direct comparison at the time of dictation. FINDINGS: Alignment: Straightening and slight reversal of the normal cervical lordosis. No listhesis. Vertebrae: Modic type 1 degenerative endplate changes at C6-7 with associated discogenic edema. Vertebral body heights are maintained. No evidence of fracture. No suspicious osseous lesion. Cord: Increased signal within the cervical cord at C3-4. Cord signal and caliber is otherwise unremarkable. Posterior Fossa, vertebral arteries, paraspinal tissues: The visualized posterior fossa is unremarkable. Paraspinal soft tissues are unremarkable. Disc levels: C2-3: Disc bulge eccentric to the left which indents the ventral thecal sac without contacting the spinal cord. Bilateral facet arthrosis. Mild left foraminal stenosis. C3-4: Disc osteophyte complex indents the ventral thecal sac and contacts the ventral cervical cord with flattening of the cord. Additional thickening of the ligamentum flavum. Moderate to severe spinal canal stenosis at this level with associated cord compression. Subtle cord signal abnormality concerning for myelomalacia versus edema. Facet arthrosis and uncovertebral hypertrophy with moderate right and mild-to-moderate left foraminal stenosis. C4-5: Disc bulge eccentric to the left indents the ventral  thecal sac  with flattening of the ventral cervical cord. Thickening of the ligamentum flavum. Moderate spinal canal stenosis without evidence of cord compression or cord signal abnormality. Bilateral facet arthrosis. Uncovertebral hypertrophy more pronounced on the right. Mild-to-moderate right and severe left foraminal stenosis. C5-6: Disc osteophyte complex eccentric to the left indents the ventral thecal sac with mild flattening of the ventral cervical cord. Bilateral facet arthrosis. Uncovertebral hypertrophy on the left. Severe left foraminal stenosis. C6-7: Diffuse disc bulge which indents the ventral thecal sac without contacting the spinal cord. Bilateral facet arthrosis and uncovertebral hypertrophy. Mild left foraminal stenosis. C7-T1: No significant spinal canal stenosis. No significant foraminal stenosis. IMPRESSION: Disc osteophyte complex at C3-4 abuts the ventral cervical cord. Moderate to severe spinal canal stenosis at this level with mild cord compression and cord signal abnormality suggestive of myelomalacia versus edema. No findings to suggest acute disc herniation. Additional moderate spinal canal stenosis at C4-5. Multilevel foraminal stenosis, greatest and severe on the left at C4-5 and C5-6. Degenerative endplate changes and discogenic edema at C6-7 which may contribute to neck pain. Electronically Signed   By: Emily Filbert M.D.   On: 04/23/2023 12:44   DG Chest 2 View Result Date: 04/17/2023 CLINICAL DATA:  Upper back and mid chest pain that radiates to the left arm for 3 days. EXAM: CHEST - 2 VIEW COMPARISON:  04/01/2021 FINDINGS: The heart size and mediastinal contours are within normal limits. Both lungs are clear. Curvature of the midthoracic spine is convex towards the right. IMPRESSION: 1. No active cardiopulmonary disease. 2. Thoracic dextroscoliosis. Electronically Signed   By: Signa Kell M.D.   On: 04/17/2023 06:36   DG Shoulder Left Result Date: 04/15/2023 CLINICAL DATA:   Pain No recent injury, shoulder began to hurt about 24 hours ago,entire shoulder hurts with limited range of motion, history of previous dislocations EXAM: LEFT SHOULDER - 2+ VIEW COMPARISON:  None Available. FINDINGS: There is no evidence of fracture or dislocation. There is no evidence of arthropathy or other focal bone abnormality. Soft tissues are unremarkable. IMPRESSION: Negative. Electronically Signed   By: Tish Frederickson M.D.   On: 04/15/2023 03:27    Antibiotics:  Anti-infectives (From admission, onward)    Start     Dose/Rate Route Frequency Ordered Stop   05/01/23 0615  vancomycin (VANCOCIN) IVPB 1000 mg/200 mL premix        1,000 mg 200 mL/hr over 60 Minutes Intravenous On call to O.R. 05/01/23 1610 05/01/23 0801       Discharge Exam: Blood pressure 129/78, pulse 96, temperature 98 F (36.7 C), temperature source Oral, resp. rate 20, height 5\' 7"  (1.702 m), weight 89.4 kg, last menstrual period 01/07/2017, SpO2 99%. Neurologic: Grossly normal Ambulating and voiding well incision cdi   Discharge Medications:   Allergies as of 05/02/2023       Reactions   Aspirin Hives, Nausea And Vomiting   Coconut Flavoring Agent (non-screening) Hives   Other Other (See Comments)   Hi Chew Candy- caused blisters inside mouth.   Penicillins Nausea And Vomiting, Other (See Comments)   Passed out   Sulfa Antibiotics Other (See Comments)   Pt states that this medication causes blisters in mouth and throat.         Medication List     TAKE these medications    albuterol 108 (90 Base) MCG/ACT inhaler Commonly known as: VENTOLIN HFA Inhale 2 puffs into the lungs every 6 (six) hours as needed for wheezing or shortness of breath.  allopurinol 100 MG tablet Commonly known as: ZYLOPRIM Take 100mg  by mouth daily for 7 days and then increase to 200mg  by mouth daily until follow up on 06/26/2022. What changed:  how much to take how to take this when to take this reasons to take  this additional instructions   colchicine 0.6 MG tablet Take 1 tablet (0.6 mg total) by mouth daily.   cyclobenzaprine 5 MG tablet Commonly known as: FLEXERIL Take 1 tablet (5 mg total) by mouth 3 (three) times daily as needed for muscle spasms.   gabapentin 300 MG capsule Commonly known as: NEURONTIN Take 300 mg by mouth 3 (three) times daily.   HYDROcodone-acetaminophen 5-325 MG tablet Commonly known as: NORCO/VICODIN Take 1 tablet by mouth every 4 (four) hours as needed for severe pain (pain score 7-10).        Disposition: home   Final Dx: acdf C3-4, C4-5, C5-6  Discharge Instructions      Remove dressing in 72 hours   Complete by: As directed    Call MD for:  difficulty breathing, headache or visual disturbances   Complete by: As directed    Call MD for:  hives   Complete by: As directed    Call MD for:  persistant nausea and vomiting   Complete by: As directed    Call MD for:  redness, tenderness, or signs of infection (pain, swelling, redness, odor or green/yellow discharge around incision site)   Complete by: As directed    Call MD for:  severe uncontrolled pain   Complete by: As directed    Call MD for:  temperature >100.4   Complete by: As directed    Diet - low sodium heart healthy   Complete by: As directed    Driving Restrictions   Complete by: As directed    No driving for 2 weeks, no riding in the car for 1 week   Increase activity slowly   Complete by: As directed    Lifting restrictions   Complete by: As directed    No lifting more than 8 lbs          Signed: Tiana Loft Zygmund Passero 05/02/2023, 7:54 AM

## 2023-05-02 NOTE — Progress Notes (Signed)
 RN went over DC instructions with pt and she stated understanding IV has been removed and belongings are with the patient.

## 2023-05-02 NOTE — Evaluation (Signed)
 Occupational Therapy Evaluation/Discharge Patient Details Name: Paula Cervantes MRN: [id number] DOB: Oct 16, 1975 Today's Date: 05/02/2023   History of Present Illness   Pt is a 48 y/o admitted for ACDF C3/4, C4/5, C5/6 in setting of cervical spondylosis with radiculopathy. PMH: B hip replacements, OA     Clinical Impressions PTA, pt lives with spouse and children, typically completely Independent and working for [employer] [place] though limited by recent significant progression of L shoulder/neck pain. Pt presents now with pain much improved though minor deficits noted in sensation of L hand and ROM of L shoulder. Educated re: cervical precautions for ADLs, IADL considerations, sleeping positions, and lifting restrictions. Discussed gentle gravity minimized exercises for L shoulder w/ pt reporting understanding. Pt able to manage ADLs and mobility without physical assistance. No further skilled OT services needed at this time.      If plan is discharge home, recommend the following:   Assist for transportation     Functional Status Assessment   Patient has not had a recent decline in their functional status     Equipment Recommendations   None recommended by OT     Recommendations for Other Services         Precautions/Restrictions   Precautions Precautions: Cervical Precaution Booklet Issued: Yes (comment) Recall of Precautions/Restrictions: Intact Restrictions Weight Bearing Restrictions Per Provider Order: No     Mobility Bed Mobility Overal bed mobility: Modified Independent                  Transfers Overall transfer level: Independent Equipment used: None                      Balance Overall balance assessment: No apparent balance deficits (not formally assessed)                                         ADL either performed or assessed with clinical judgement   ADL Overall ADL's : Modified independent                                        General ADL Comments: able to dress self EOB without issues, mobilize on unit. Educated re: cervical precautions, lifting restrictions, ADL mgmt at home, IADL considerations and sleeping positions     Vision Ability to See in Adequate Light: 0 Adequate Patient Visual Report: No change from baseline Vision Assessment?: No apparent visual deficits     Perception         Praxis         Pertinent Vitals/Pain Pain Assessment Pain Assessment: 0-10 Pain Score: 4  Pain Location: L shoulder/neck Pain Descriptors / Indicators: Sore Pain Intervention(s): Monitored during session, Limited activity within patient's tolerance     Extremity/Trunk Assessment Upper Extremity Assessment Upper Extremity Assessment: Right hand dominant;LUE deficits/detail LUE Deficits / Details: reports continued numbness around thenar compartment, has to look at hand to hold onto items. able to lift UE to 85*. discussed tabletop towel pushes and finger walking up wall with pt familiar w/ these exercises from prior dislocated shoulder LUE Sensation: decreased light touch LUE Coordination: decreased fine motor   Lower Extremity Assessment Lower Extremity Assessment: Defer to PT evaluation   Cervical / Trunk Assessment Cervical / Trunk Assessment: Normal   Communication Communication Communication:  No apparent difficulties   Cognition Arousal: Alert Behavior During Therapy: WFL for tasks assessed/performed Cognition: No apparent impairments                               Following commands: Intact       Cueing  General Comments   Cueing Techniques: Verbal cues  Husband at bedside   Exercises     Shoulder Instructions      Home Living Family/patient expects to be discharged to:: Private residence Living Arrangements: Spouse/significant other;Children Available Help at Discharge: Family Type of Home: House Home Access: Ramped  entrance;Stairs to enter Entrance Stairs-Number of Steps: 2-3 Entrance Stairs-Rails: Right Home Layout: One level     Bathroom Shower/Tub: Chief Strategy Officer: Handicapped height Bathroom Accessibility: Yes   Home Equipment: Grab bars - tub/shower          Prior Functioning/Environment Prior Level of Function : Independent/Modified Independent;Driving;Working/employed             Mobility Comments: no AD ADLs Comments: works for [employer] [place]. Indep with ADLs, IADLs. recently limited by progressive L shoulder and neck pain    OT Problem List: Pain;Decreased range of motion   OT Treatment/Interventions:        OT Goals(Current goals can be found in the care plan section)   Acute Rehab OT Goals Patient Stated Goal: further pain decrease OT Goal Formulation: All assessment and education complete, DC therapy   OT Frequency:       Co-evaluation              AM-PAC OT "6 Clicks" Daily Activity     Outcome Measure Help from another person eating meals?: None Help from another person taking care of personal grooming?: None Help from another person toileting, which includes using toliet, bedpan, or urinal?: None Help from another person bathing (including washing, rinsing, drying)?: None Help from another person to put on and taking off regular upper body clothing?: None Help from another person to put on and taking off regular lower body clothing?: None 6 Click Score: 24   End of Session Nurse Communication: Mobility status  Activity Tolerance: Patient tolerated treatment well Patient left: in bed;with call bell/phone within reach;with family/visitor present;with nursing/sitter in room  OT Visit Diagnosis: Pain Pain - Right/Left: Left Pain - part of body: Shoulder                Time: 8295-6213 OT Time Calculation (min): 14 min Charges:  OT General Charges $OT Visit: 1 Visit OT Evaluation $OT Eval Low Complexity: 1 Low  Paula Cervantes,  OTR/L Acute Rehab Services Office: [phone]   Paula Cervantes 05/02/2023, 8:50 AM

## 2023-05-04 ENCOUNTER — Encounter (HOSPITAL_COMMUNITY): Payer: Self-pay | Admitting: Neurosurgery

## 2023-05-19 DIAGNOSIS — M4722 Other spondylosis with radiculopathy, cervical region: Secondary | ICD-10-CM | POA: Diagnosis not present

## 2023-06-08 DIAGNOSIS — M50822 Other cervical disc disorders at C5-C6 level: Secondary | ICD-10-CM | POA: Diagnosis not present

## 2023-06-08 DIAGNOSIS — M4722 Other spondylosis with radiculopathy, cervical region: Secondary | ICD-10-CM | POA: Diagnosis not present

## 2023-06-08 DIAGNOSIS — M50823 Other cervical disc disorders at C6-C7 level: Secondary | ICD-10-CM | POA: Diagnosis not present

## 2023-08-20 DIAGNOSIS — D2361 Other benign neoplasm of skin of right upper limb, including shoulder: Secondary | ICD-10-CM | POA: Diagnosis not present

## 2023-08-20 DIAGNOSIS — L308 Other specified dermatitis: Secondary | ICD-10-CM | POA: Diagnosis not present

## 2023-08-26 DIAGNOSIS — R82998 Other abnormal findings in urine: Secondary | ICD-10-CM | POA: Diagnosis not present

## 2023-08-26 DIAGNOSIS — N390 Urinary tract infection, site not specified: Secondary | ICD-10-CM | POA: Diagnosis not present

## 2023-08-26 DIAGNOSIS — R350 Frequency of micturition: Secondary | ICD-10-CM | POA: Diagnosis not present

## 2023-08-26 DIAGNOSIS — Z6827 Body mass index (BMI) 27.0-27.9, adult: Secondary | ICD-10-CM | POA: Diagnosis not present

## 2023-09-14 ENCOUNTER — Ambulatory Visit (INDEPENDENT_AMBULATORY_CARE_PROVIDER_SITE_OTHER): Admitting: Podiatry

## 2023-09-14 VITALS — Ht 67.0 in | Wt 197.0 lb

## 2023-09-14 DIAGNOSIS — B070 Plantar wart: Secondary | ICD-10-CM

## 2023-09-14 NOTE — Progress Notes (Signed)
  Subjective:  Patient ID: Paula Cervantes, female    DOB: [birth year],  MRN: [id number]  Chief Complaint  Patient presents with   Foot Pain    Rm 23 Patient is here for left foot pain due to plantar warts on the left foot.    48 y.o. female presents with the above complaint. History confirmed with patient.  She follows up today for ongoing issues with lesions on the left foot that are quite painful.  Prior treatments have helped but she has not had one in some time had to have cervical spine fusion  Objective:  Physical Exam: warm, good capillary refill, no trophic changes or ulcerative lesions, normal DP and PT pulses, normal sensory exam, and sub-IPJ of the hallux and left Submetatarsal 5 there are 2 painful hyperkeratotic skin lesions, disruption of skin lines running this pain with direct and lateral compression..  Assessment:   1. Verruca plantaris       Plan:  Patient was evaluated and treated and all questions answered.  Discussed etiology and treatment of verruca plantaris in detail with the patient as well as multiple treatment options including blistering agents, chemotherapeutic agents, surgical excision, laser therapy and the indications and roles of the above.  Today, recommended treatment with Cantharone as noted in procedure note below.  Follow-up in 1 month for reevaluation.  We also discussed surgical considerations but we discussed the limitations of this as well as the possibility of recurrence.  She also has been out of work for 6 months due to her spine issue issue  Procedure: Destruction of Lesion Location: Left foot submetatarsal 5 Instrumentation: 15 blade. Technique: Debridement of lesion to petechial bleeding. Aperture pad applied around lesion. Small amount of canthrone applied to the base of the lesion. Dressing: Dry, sterile, compression dressing. Disposition: Patient tolerated procedure well. Advised to leave dressing on for 6-8 hours. Thereafter patient to  wash the area with soap and water  and applied band-aid. Off-loading pads dispensed. Patient to return in 3 weeks for follow-up.  I do think she would benefit from a custom orthosis to offload the lesions as well as the other hyperkeratotic spots.  Patient was casted for these today  Return in about 1 month (around 10/15/2023) for wart treatment.

## 2023-09-16 DIAGNOSIS — R413 Other amnesia: Secondary | ICD-10-CM | POA: Diagnosis not present

## 2023-09-16 DIAGNOSIS — Z6828 Body mass index (BMI) 28.0-28.9, adult: Secondary | ICD-10-CM | POA: Diagnosis not present

## 2023-10-08 DIAGNOSIS — M25561 Pain in right knee: Secondary | ICD-10-CM | POA: Diagnosis not present

## 2023-10-12 ENCOUNTER — Ambulatory Visit: Admitting: Podiatry

## 2023-10-13 DIAGNOSIS — Z6826 Body mass index (BMI) 26.0-26.9, adult: Secondary | ICD-10-CM | POA: Diagnosis not present

## 2023-10-13 DIAGNOSIS — M4722 Other spondylosis with radiculopathy, cervical region: Secondary | ICD-10-CM | POA: Diagnosis not present

## 2023-10-22 ENCOUNTER — Ambulatory Visit (INDEPENDENT_AMBULATORY_CARE_PROVIDER_SITE_OTHER): Admitting: Podiatry

## 2023-10-22 ENCOUNTER — Encounter: Payer: Self-pay | Admitting: Podiatry

## 2023-10-22 VITALS — Ht 67.0 in | Wt 197.0 lb

## 2023-10-22 DIAGNOSIS — B070 Plantar wart: Secondary | ICD-10-CM

## 2023-10-22 NOTE — Progress Notes (Signed)
  Subjective:  Patient ID: Paula Cervantes, female    DOB: [birth year],  MRN: [id number]  Chief Complaint  Patient presents with   Plantar Warts    Rm 21 Pt is here for treatment of verruca plantaris of the left foot     48 y.o. female presents with the above complaint. History confirmed with patient.  She follows up today has had some improvement  Objective:  Physical Exam: warm, good capillary refill, no trophic changes or ulcerative lesions, normal DP and PT pulses, normal sensory exam, and sub-IPJ of the hallux and left Submetatarsal 5 there are 2 painful hyperkeratotic skin lesions, disruption of skin lines running this pain with direct and lateral compression.  All lesions have improved the hallux lesions have nearly fully resolved.  Assessment:   1. Verruca plantaris       Plan:  Patient was evaluated and treated and all questions answered.  Discussed etiology and treatment of verruca plantaris in detail with the patient as well as multiple treatment options including blistering agents, chemotherapeutic agents, surgical excision, laser therapy and the indications and roles of the above.  Today, recommended treatment with Cantharone as noted in procedure note below.  Follow-up in 1 month for reevaluation.  The fifth metatarsal lesion likely will take 2 more treatments and expect the other smaller lesions to be resolved after the next visit  Procedure: Destruction of Lesion Location: Left foot submetatarsal 5 Instrumentation: 15 blade. Technique: Debridement of lesion to petechial bleeding. Aperture pad applied around lesion. Small amount of canthrone applied to the base of the lesion. Dressing: Dry, sterile, compression dressing. Disposition: Patient tolerated procedure well. Advised to leave dressing on for 6-8 hours. Thereafter patient to wash the area with soap and water  and applied band-aid.     Return in about 1 month (around 11/21/2023) for wart treatment.

## 2023-11-23 ENCOUNTER — Ambulatory Visit (INDEPENDENT_AMBULATORY_CARE_PROVIDER_SITE_OTHER): Admitting: Podiatry

## 2023-11-23 DIAGNOSIS — Z91199 Patient's noncompliance with other medical treatment and regimen due to unspecified reason: Secondary | ICD-10-CM

## 2023-11-25 NOTE — Progress Notes (Signed)
 Patient was no-show for appointment today

## 2024-01-19 DIAGNOSIS — M4722 Other spondylosis with radiculopathy, cervical region: Secondary | ICD-10-CM | POA: Diagnosis not present

## 2024-01-19 DIAGNOSIS — Z6825 Body mass index (BMI) 25.0-25.9, adult: Secondary | ICD-10-CM | POA: Diagnosis not present

## 2024-01-26 DIAGNOSIS — M4722 Other spondylosis with radiculopathy, cervical region: Secondary | ICD-10-CM | POA: Diagnosis not present

## 2024-02-27 ENCOUNTER — Emergency Department (HOSPITAL_COMMUNITY)

## 2024-02-27 ENCOUNTER — Emergency Department (HOSPITAL_COMMUNITY)
Admission: EM | Admit: 2024-02-27 | Discharge: 2024-02-27 | Disposition: A | Discharge status: Home / Self Care | Attending: Emergency Medicine | Admitting: Emergency Medicine

## 2024-02-27 ENCOUNTER — Emergency Department (HOSPITAL_COMMUNITY)
Admission: EM | Admit: 2024-02-27 | Discharge: 2024-02-27 | Discharge status: Against Medical Advice | Attending: Emergency Medicine | Admitting: Emergency Medicine

## 2024-02-27 ENCOUNTER — Encounter (HOSPITAL_COMMUNITY): Payer: Self-pay

## 2024-02-27 ENCOUNTER — Other Ambulatory Visit: Payer: Self-pay

## 2024-02-27 ENCOUNTER — Encounter (HOSPITAL_COMMUNITY): Payer: Self-pay | Admitting: Emergency Medicine

## 2024-02-27 DIAGNOSIS — R1110 Vomiting, unspecified: Secondary | ICD-10-CM | POA: Diagnosis not present

## 2024-02-27 DIAGNOSIS — M542 Cervicalgia: Secondary | ICD-10-CM | POA: Diagnosis present

## 2024-02-27 DIAGNOSIS — M5412 Radiculopathy, cervical region: Secondary | ICD-10-CM | POA: Insufficient documentation

## 2024-02-27 DIAGNOSIS — Z5321 Procedure and treatment not carried out due to patient leaving prior to being seen by health care provider: Secondary | ICD-10-CM | POA: Insufficient documentation

## 2024-02-27 DIAGNOSIS — M549 Dorsalgia, unspecified: Secondary | ICD-10-CM | POA: Diagnosis present

## 2024-02-27 MED ORDER — HYDROMORPHONE HCL 1 MG/ML IJ SOLN
1.0000 mg | Freq: Once | INTRAMUSCULAR | Status: AC
  Administered 2024-02-27: 1 mg via INTRAMUSCULAR
  Filled 2024-02-27: qty 1

## 2024-02-27 MED ORDER — ONDANSETRON 4 MG PO TBDP
4.0000 mg | ORAL_TABLET | Freq: Once | ORAL | Status: AC
  Administered 2024-02-27: 4 mg via ORAL
  Filled 2024-02-27: qty 1

## 2024-02-27 MED ORDER — ONDANSETRON 4 MG PO TBDP: 4.0000 mg | ORAL_TABLET | Freq: Three times a day (TID) | ORAL | 0 refills | Status: AC | PRN

## 2024-02-27 MED ORDER — KETOROLAC TROMETHAMINE 60 MG/2ML IM SOLN
60.0000 mg | Freq: Once | INTRAMUSCULAR | Status: AC
  Administered 2024-02-27: 60 mg via INTRAMUSCULAR
  Filled 2024-02-27: qty 2

## 2024-02-27 MED ORDER — PROMETHAZINE HCL 25 MG/ML IJ SOLN: 12.5000 mg | Freq: Four times a day (QID) | INTRAMUSCULAR | Status: DC | PRN

## 2024-02-27 MED ORDER — HYDROCODONE-ACETAMINOPHEN 5-325 MG PO TABS: 1.0000 | ORAL_TABLET | Freq: Four times a day (QID) | ORAL | 0 refills | Status: AC | PRN

## 2024-02-27 NOTE — ED Triage Notes (Signed)
 Pt states that she has known cervical stenosis and woke up approx 30 mins ago with increased posterior neck pain. Pt also states that she is vomiting from pain intensity

## 2024-02-27 NOTE — ED Provider Notes (Signed)
 I walked into the patient's room and introduced myself. Patient was crying and putting her sweater on. She stated that she had neck surgery, was in pain, is being treated like an addict and forced to lie on a bed for an EKG but she can't because she is in pain and would be going to [facility] I advised the patient I was there to take care of her and if she would like to stay, can get her work up started today. She refused and walked out.    Beverley Leita LABOR, PA-C 02/27/24 0405    Palumbo, April, MD 02/27/24 [facility]

## 2024-02-27 NOTE — ED Triage Notes (Signed)
 Chronic pain in neck with acute episode of pain starting tonight. Cannot move left arm due to pain. No loss of bowel or bladder function. Took her home gabapentin  tonight but no other meds.

## 2024-02-27 NOTE — ED Notes (Addendum)
 When I attempted EKG Pt stated they didn't want to be treated like a drug addict, refused the EKG and stated they were going to [facility] instead and that an EKG would be putting them in more pain, not helping and that she needed something for pain instead.

## 2024-02-27 NOTE — ED Provider Notes (Signed)
 " [place] EMERGENCY DEPARTMENT AT [facility] Provider Note   CSN: [id number] Arrival date & time: 02/27/24  9581     Patient presents with: Back Pain   Paula Cervantes is a 49 y.o. female.   The history is provided by the patient and medical records.  Back Pain Paula Cervantes is a 49 y.o. female who presents to the Emergency Department complaining of arm pain.  She presents to the emergency department for evaluation of severe pain in her left scapular region as well as in her entire left upper extremity.  She has a history of severe spinal stenosis and has had chronic problems with this arm.  She had a severe flareup of her pain when she woke from sleep around 3 in the morning.  Pain is worse with movement of her arm.  She does have nausea secondary to pain.  No chest pain or shortness of breath.  She has had thumb numbness since last year.  She takes tramadol  and gabapentin  at home for her symptoms but they did not help today.  No history of diabetes, hypertension or heart disease.  She did have an outpatient MRI performed, which demonstrated worsening stenosis.  No associated fevers.      Prior to Admission medications  Medication Sig Start Date End Date Taking? Authorizing Provider  HYDROcodone -acetaminophen  (NORCO/VICODIN) 5-325 MG tablet Take 1 tablet by mouth every 6 (six) hours as needed. 02/27/24  Yes Griselda Norris, MD  ondansetron  (ZOFRAN -ODT) 4 MG disintegrating tablet Take 1 tablet (4 mg total) by mouth every 8 (eight) hours as needed. 02/27/24  Yes Griselda Norris, MD  albuterol  (VENTOLIN  HFA) 108 [phone] Base) MCG/ACT inhaler Inhale 2 puffs into the lungs every 6 (six) hours as needed for wheezing or shortness of breath. Patient not taking: Reported on 09/14/2023 03/01/20   Kip Ade, NP  allopurinol  (ZYLOPRIM ) 100 MG tablet Take 100mg  by mouth daily for 7 days and then increase to 200mg  by mouth daily until follow up on 06/26/2022. Patient taking differently: Take  100 mg by mouth daily as needed (gout). 06/13/22   Cheryl Waddell HERO, PA-C  colchicine  0.6 MG tablet Take 1 tablet (0.6 mg total) by mouth daily. 06/13/22   Cheryl Waddell HERO, PA-C  cyclobenzaprine  (FLEXERIL ) 5 MG tablet Take 1 tablet (5 mg total) by mouth 3 (three) times daily as needed for muscle spasms. Patient not taking: Reported on 09/14/2023 05/02/23   Meyran, Kimberly Hannah, NP  gabapentin  (NEURONTIN ) 300 MG capsule Take 300 mg by mouth 3 (three) times daily. Patient not taking: Reported on 09/14/2023    [provider]    Allergies: Aspirin, Coconut flavoring agent (non-screening), Other, Penicillins, and Sulfa antibiotics    Review of Systems  Musculoskeletal:  Positive for back pain.  All other systems reviewed and are negative.   Updated Vital Signs BP 114/89 (BP Location: Right Arm)   Pulse 87   Temp 98.4 F (36.9 C) (Oral)   Resp 17   LMP 01/07/2017   SpO2 100%   Physical Exam Vitals and nursing note reviewed.  Constitutional:      Appearance: Normal appearance.  HENT:     Head: Normocephalic and atraumatic.  Neck:     Comments: No midline cervical spine tenderness Cardiovascular:     Rate and Rhythm: Normal rate and regular rhythm.     Heart sounds: No murmur heard. Pulmonary:     Effort: Pulmonary effort is normal.     Breath sounds: Normal breath  sounds. No rales.  Abdominal:     Palpations: Abdomen is soft.  Musculoskeletal:     Comments: There is tenderness to palpation over the left upper posterior chest wall without any overlying rashes.  2+ radial pulses bilaterally.  Unable to range the left shoulder due to worsening of her pain throughout her left upper extremity.  There is no pain on palpation over the shoulder  Skin:    General: Skin is warm and dry.  Neurological:     Mental Status: She is alert.     Comments: Altered sensation to light touch over the left thumb.  She is able to grip symmetrically in the bilateral hands.  She has symmetric  shrug.  Sensation light touch intact throughout upper extremities grossly intact.  Unable to test proximal strength secondary to pain.  Normal gait.  Psychiatric:        Mood and Affect: Mood normal.     (all labs ordered are listed, but only abnormal results are displayed) Labs Reviewed - No data to display  EKG: EKG Interpretation Date/Time:  Saturday February 27 2024 05:38:01 EST Ventricular Rate:  63 PR Interval:  187 QRS Duration:  88 QT Interval:  398 QTC Calculation: 408 R Axis:   18  Text Interpretation: Sinus rhythm Confirmed by Griselda Norris [phone]) on 02/27/2024 5:41:14 AM  Radiology: [facility] Chest Port 1 View Result Date: 02/27/2024 CLINICAL DATA:  Chest pain and back pain. EXAM: PORTABLE CHEST 1 VIEW COMPARISON:  04/17/2023 FINDINGS: The lungs are clear without focal pneumonia, edema, pneumothorax or pleural effusion. The cardiopericardial silhouette is within normal limits for size. No acute bony abnormality. Telemetry leads overlie the chest. IMPRESSION: No active disease. Electronically Signed   By: Camellia Candle M.D.   On: 02/27/2024 07:02     Procedures   Medications Ordered in the ED  promethazine  (PHENERGAN ) injection 12.5 mg (has no administration in time range)  ketorolac  (TORADOL ) injection 60 mg (60 mg Intramuscular Given 02/27/24 0518)  HYDROmorphone  (DILAUDID ) injection 1 mg (1 mg Intramuscular Given 02/27/24 0518)  ondansetron  (ZOFRAN -ODT) disintegrating tablet 4 mg (4 mg Oral Given 02/27/24 0519)                                    Medical Decision Making Amount and/or Complexity of Data Reviewed Radiology: ordered.  Risk Prescription drug management.   Patient with known cervical stenosis here for evaluation of acute exacerbation of her pain.  No concern for acute infectious process.  She was treated with medications for pain control in the emergency department with improvement in her symptoms.  She does report having an MRI performed as an  outpatient on December 25, images are not available at this time.  Discussed importance of continuing with neurosurgery follow-up as well as return precautions.  Will provide short course of additional pain medications.  Discussed additional therapeutic measures that she may take for symptom management such as activity, cold and warm compresses, Lidoderm  patches.     Final diagnoses:  Cervical radiculopathy    ED Discharge Orders          Ordered    ondansetron  (ZOFRAN -ODT) 4 MG disintegrating tablet  Every 8 hours PRN        02/27/24 0627    HYDROcodone -acetaminophen  (NORCO/VICODIN) 5-325 MG tablet  Every 6 hours PRN        02/27/24 0707  Griselda Norris, MD 02/27/24 [phone]  "
